# Patient Record
Sex: Male | Born: 1940 | Race: White | Hispanic: No | Smoking: Former smoker
Health system: Southern US, Community
[De-identification: ages and names within clinical notes are randomized; demographics above are authoritative.]

## PROBLEM LIST (undated history)

## (undated) DIAGNOSIS — C259 Malignant neoplasm of pancreas, unspecified: Secondary | ICD-10-CM

## (undated) DIAGNOSIS — K5790 Diverticulosis of intestine, part unspecified, without perforation or abscess without bleeding: Secondary | ICD-10-CM

## (undated) DIAGNOSIS — M75101 Unspecified rotator cuff tear or rupture of right shoulder, not specified as traumatic: Secondary | ICD-10-CM

## (undated) DIAGNOSIS — I10 Essential (primary) hypertension: Secondary | ICD-10-CM

## (undated) DIAGNOSIS — I219 Acute myocardial infarction, unspecified: Secondary | ICD-10-CM

## (undated) DIAGNOSIS — F419 Anxiety disorder, unspecified: Secondary | ICD-10-CM

## (undated) DIAGNOSIS — C787 Secondary malignant neoplasm of liver and intrahepatic bile duct: Secondary | ICD-10-CM

## (undated) HISTORY — DX: Essential (primary) hypertension: I10

## (undated) HISTORY — DX: Diverticulosis of intestine, part unspecified, without perforation or abscess without bleeding: K57.90

## (undated) HISTORY — DX: Acute myocardial infarction, unspecified: I21.9

## (undated) HISTORY — DX: Malignant neoplasm of pancreas, unspecified: C25.9

## (undated) HISTORY — DX: Secondary malignant neoplasm of liver and intrahepatic bile duct: C78.7

## (undated) HISTORY — DX: Unspecified rotator cuff tear or rupture of right shoulder, not specified as traumatic: M75.101

## (undated) HISTORY — PX: CARDIAC CATHETERIZATION: SHX172

## (undated) HISTORY — PX: CORONARY STENT PLACEMENT: SHX1402

---

## 2004-04-30 ENCOUNTER — Ambulatory Visit: Payer: Self-pay | Admitting: Internal Medicine

## 2004-05-13 ENCOUNTER — Ambulatory Visit: Payer: Self-pay | Admitting: Internal Medicine

## 2004-06-11 ENCOUNTER — Ambulatory Visit: Payer: Self-pay | Admitting: Gastroenterology

## 2004-06-20 ENCOUNTER — Ambulatory Visit: Payer: Self-pay | Admitting: Gastroenterology

## 2004-10-31 ENCOUNTER — Ambulatory Visit: Payer: Self-pay | Admitting: Internal Medicine

## 2004-11-07 ENCOUNTER — Ambulatory Visit: Payer: Self-pay | Admitting: Internal Medicine

## 2005-06-09 ENCOUNTER — Ambulatory Visit: Payer: Self-pay | Admitting: Internal Medicine

## 2005-06-16 ENCOUNTER — Ambulatory Visit: Payer: Self-pay | Admitting: Internal Medicine

## 2009-04-16 ENCOUNTER — Ambulatory Visit: Payer: Self-pay | Admitting: Internal Medicine

## 2009-04-16 LAB — CONVERTED CEMR LAB
AST: 21 units/L (ref 0–37)
Alkaline Phosphatase: 67 units/L (ref 39–117)
Basophils Absolute: 0 10*3/uL (ref 0.0–0.1)
Bilirubin, Direct: 0.1 mg/dL (ref 0.0–0.3)
Blood in Urine, dipstick: NEGATIVE
CO2: 30 meq/L (ref 19–32)
Calcium: 9.4 mg/dL (ref 8.4–10.5)
Creatinine, Ser: 1 mg/dL (ref 0.4–1.5)
Eosinophils Absolute: 0.1 10*3/uL (ref 0.0–0.7)
GFR calc non Af Amer: 78.65 mL/min (ref >60)
Glucose, Bld: 102 mg/dL — ABNORMAL HIGH (ref 70–99)
Glucose, Urine, Semiquant: NEGATIVE
HDL: 56.4 mg/dL (ref >39.00)
Hemoglobin: 15.1 g/dL (ref 13.0–17.0)
Ketones, urine, test strip: NEGATIVE
Lymphocytes Relative: 23.5 % (ref 12.0–46.0)
MCHC: 33.6 g/dL (ref 30.0–36.0)
Monocytes Relative: 9.9 % (ref 3.0–12.0)
Neutro Abs: 3.2 10*3/uL (ref 1.4–7.7)
Neutrophils Relative %: 64.2 % (ref 43.0–77.0)
Nitrite: NEGATIVE
Platelets: 175 10*3/uL (ref 150.0–400.0)
Protein, U semiquant: NEGATIVE
RDW: 12.9 % (ref 11.5–14.6)
Sodium: 142 meq/L (ref 135–145)
Total Bilirubin: 1 mg/dL (ref 0.3–1.2)
Triglycerides: 138 mg/dL (ref 0.0–149.0)
VLDL: 27.6 mg/dL (ref 0.0–40.0)
WBC Urine, dipstick: NEGATIVE
pH: 5.5

## 2009-04-23 ENCOUNTER — Ambulatory Visit: Payer: Self-pay | Admitting: Internal Medicine

## 2009-04-23 DIAGNOSIS — F411 Generalized anxiety disorder: Secondary | ICD-10-CM | POA: Insufficient documentation

## 2009-05-09 ENCOUNTER — Ambulatory Visit: Payer: Self-pay

## 2009-05-09 ENCOUNTER — Encounter: Payer: Self-pay | Admitting: Internal Medicine

## 2009-05-17 ENCOUNTER — Encounter (INDEPENDENT_AMBULATORY_CARE_PROVIDER_SITE_OTHER): Payer: Self-pay | Admitting: *Deleted

## 2009-06-18 ENCOUNTER — Ambulatory Visit: Payer: Self-pay | Admitting: Internal Medicine

## 2009-06-18 DIAGNOSIS — I77811 Abdominal aortic ectasia: Secondary | ICD-10-CM

## 2009-08-21 ENCOUNTER — Ambulatory Visit: Payer: Self-pay | Admitting: Internal Medicine

## 2009-12-12 ENCOUNTER — Telehealth: Payer: Self-pay | Admitting: Internal Medicine

## 2009-12-18 ENCOUNTER — Ambulatory Visit: Payer: Self-pay | Admitting: Internal Medicine

## 2009-12-18 DIAGNOSIS — R030 Elevated blood-pressure reading, without diagnosis of hypertension: Secondary | ICD-10-CM

## 2010-04-02 NOTE — Progress Notes (Signed)
Summary: Pt has questions re: Lexapro  Phone Note Call from Patient Call back at [facility] Phone [phone]   Caller: Patient Reason for Call: Acute Illness Summary of Call: Pt called and has questions re: Lexapro. Pls call asap.  Initial call taken by: Lucy Antigua,  December 12, 2009 11:37 AM  Follow-up for Phone Call        stopped lexapro -didnt help encouraged to keep appointment on 18 to discuss with possible other med Follow-up by: Willy Eddy, LPN,  December 12, 2009 12:15 PM

## 2010-04-02 NOTE — Assessment & Plan Note (Signed)
Summary: 2 MONTH ROV/NJR   Vital Signs:  Patient profile:   70 year old male Height:      70 inches Weight:      182 pounds BMI:     26.21 Temp:     98.2 degrees F oral Pulse rate:   68 / minute Resp:     14 per minute BP sitting:   122 / 82  (left arm)  Vitals Entered By: Willy Eddy, LPN (June 18, 2009 4:10 PM) CC: roa- hasnt started lexapro    CC:  roa- hasnt started lexapro .  History of Present Illness: the pt did not start the lexapro he has taked with his daughter and he uses the xanax  as needed his use of the xanax once a week or less worried  about life has completed the visits the AA screen was negative and this has reassured him  Preventive Screening-Counseling & Management  Alcohol-Tobacco     Alcohol drinks/day: <1     Smoking Status: quit     Year Quit: 2005  Problems Prior to Update: 1)  Anxiety State, Unspecified  (ICD-300.00) 2)  Health Screening  (ICD-V70.0)  Current Problems (verified): 1)  Anxiety State, Unspecified  (ICD-300.00) 2)  Health Screening  (ICD-V70.0)  Medications Prior to Update: 1)  Aspirin 81 Mg  Tbec (Aspirin) .... Once Daily 2)  Alprazolam 0.5 Mg Tabs (Alprazolam) .... One By Mouth As Needed Anxiety Every 8 Hours As Needed 3)  Lexapro 5 Mg Tabs (Escitalopram Oxalate) .... One By Mouth Daily ( 1/2 By Mouth Daily)  Current Medications (verified): 1)  Aspirin 81 Mg  Tbec (Aspirin) .... Once Daily 2)  Alprazolam 0.5 Mg Tabs (Alprazolam) .... One By Mouth As Needed Anxiety Every 8 Hours As Needed 3)  Lexapro 5 Mg Tabs (Escitalopram Oxalate) .... One By Mouth Daily ( 1/2 By Mouth Daily)  Allergies (verified): No Known Drug Allergies  Past History:  Risk Factors: Alcohol Use: <1 (06/18/2009) Exercise: yes (04/23/2009)  Risk Factors: Smoking Status: quit (06/18/2009)  Past medical, surgical, family and social histories (including risk factors) reviewed, and no changes noted (except as noted below).  Past Medical  History: Reviewed history from 11/25/2006 and no changes required. Unremarkable  Family History: Reviewed history and no changes required.  Social History: Reviewed history and no changes required.  Review of Systems  The patient denies anorexia, fever, weight loss, weight gain, vision loss, decreased hearing, hoarseness, chest pain, syncope, dyspnea on exertion, peripheral edema, prolonged cough, headaches, hemoptysis, abdominal pain, melena, hematochezia, severe indigestion/heartburn, hematuria, incontinence, genital sores, muscle weakness, suspicious skin lesions, transient blindness, difficulty walking, depression, unusual weight change, abnormal bleeding, enlarged lymph nodes, angioedema, breast masses, and testicular masses.    Physical Exam  General:  Well-developed,well-nourished,in no acute distress; alert,appropriate and cooperative throughout examination Head:  normocephalic, no abnormalities palpated, and male-pattern balding.   Eyes:  pupils equal and pupils round.   Ears:  R ear normal and L ear normal.   Neck:  supple and full ROM.   Lungs:  normal respiratory effort, no dullness, and no wheezes.   Heart:  normal rate and regular rhythm.   Abdomen:  soft, non-tender, and normal bowel sounds.   Neurologic:  alert & oriented X3 and gait normal.   Psych:  Oriented X3 and slightly anxious.     Impression & Recommendations:  Problem # 1:  ANXIETY STATE, UNSPECIFIED (ICD-300.00) disucssion of med use and need for control of mood His updated  medication list for this problem includes:    Alprazolam 0.5 Mg Tabs (Alprazolam) ..... One by mouth as needed anxiety every 8 hours as needed    Lexapro 5 Mg Tabs (Escitalopram oxalate) ..... One by mouth daily ( 1/2 by mouth daily)  Discussed medication use and relaxation techniques.   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 Once daily 2)  Alprazolam 0.5 Mg Tabs (Alprazolam) .... One by mouth as needed anxiety every 8 hours as needed 3)  Lexapro 5 Mg Tabs (Escitalopram oxalate) .... One by mouth daily ( 1/2 by mouth daily)  Patient Instructions: 1)  try the 1/2 of the lexapro 2)  Please schedule a follow-up appointment in 2 months.

## 2010-04-02 NOTE — Assessment & Plan Note (Signed)
Summary: 2 month rov/njr   Vital Signs:  Patient profile:   70 year old male Height:      70 inches Weight:      180 pounds BMI:     25.92 Temp:     98.2 degrees F oral Pulse rate:   72 / minute Resp:     14 per minute BP sitting:   114 / 76  (left arm)  Vitals Entered By: Willy Eddy, LPN (August 21, 2009 3:24 PM) CC: roa after starting lexapro- states he doesnt feel any different   CC:  roa after starting lexapro- states he doesnt feel any different.  History of Present Illness: the pt finally started the lexapro 1/2 a day the pt has not detected any changes still gets emotiolly upset increased anxiety has not been  "fixed" the xanax works better taking minimal anxiety meds  could this be related to the lexapro    Preventive Screening-Counseling & Management  Alcohol-Tobacco     Alcohol drinks/day: <1     Smoking Status: quit     Year Quit: 2005  Problems Prior to Update: 1)  Abdominal Aortic Ectasia  (ICD-447.72) 2)  Anxiety State, Unspecified  (ICD-300.00) 3)  Health Screening  (ICD-V70.0)  Current Problems (verified): 1)  Abdominal Aortic Ectasia  (ICD-447.72) 2)  Anxiety State, Unspecified  (ICD-300.00) 3)  Health Screening  (ICD-V70.0)  Medications Prior to Update: 1)  Aspirin 81 Mg  Tbec (Aspirin) .... Once Daily 2)  Alprazolam 0.5 Mg Tabs (Alprazolam) .... One By Mouth As Needed Anxiety Every 8 Hours As Needed 3)  Lexapro 5 Mg Tabs (Escitalopram Oxalate) .... One By Mouth Daily ( 1/2 By Mouth Daily)  Current Medications (verified): 1)  Aspirin 81 Mg  Tbec (Aspirin) .... Once Daily 2)  Alprazolam 0.5 Mg Tabs (Alprazolam) .... One By Mouth As Needed Anxiety Every 8 Hours As Needed 3)  Lexapro 5 Mg Tabs (Escitalopram Oxalate) .... One By Mouth Daily ( 1/2 By Mouth Daily)  Allergies (verified): No Known Drug Allergies  Past History:  Risk Factors: Alcohol Use: <1 (08/21/2009) Exercise: yes (04/23/2009)  Risk Factors: Smoking Status: quit  (08/21/2009)  Past medical, surgical, family and social histories (including risk factors) reviewed, and no changes noted (except as noted below).  Past Medical History: Reviewed history from 11/25/2006 and no changes required. Unremarkable  Family History: Reviewed history and no changes required.  Social History: Reviewed history and no changes required.  Review of Systems  The patient denies anorexia, fever, weight loss, weight gain, vision loss, decreased hearing, hoarseness, chest pain, syncope, dyspnea on exertion, peripheral edema, prolonged cough, headaches, hemoptysis, abdominal pain, melena, hematochezia, severe indigestion/heartburn, hematuria, incontinence, genital sores, muscle weakness, suspicious skin lesions, transient blindness, difficulty walking, depression, unusual weight change, abnormal bleeding, enlarged lymph nodes, angioedema, and breast masses.    Physical Exam  General:  Well-developed,well-nourished,in no acute distress; alert,appropriate and cooperative throughout examination Head:  normocephalic, no abnormalities palpated, and male-pattern balding.   Eyes:  pupils equal and pupils round.   Ears:  R ear normal and L ear normal.   Nose:  no external deformity.   Mouth:  no gingival abnormalities and pharynx pink and moist.   Neck:  supple and full ROM.   Lungs:  normal respiratory effort, no dullness, and no wheezes.   Heart:  normal rate and regular rhythm.   Abdomen:  soft, non-tender, and normal bowel sounds.   Genitalia:  circumcised.     Impression &  Recommendations:  Problem # 1:  ANXIETY STATE, UNSPECIFIED (ICD-300.00) Assessment Improved  His updated medication list for this problem includes:    Alprazolam 0.5 Mg Tabs (Alprazolam) ..... One by mouth as needed anxiety every 8 hours as needed    Lexapro 5 Mg Tabs (Escitalopram oxalate) ..... One by mouth daily ( 1/2 by mouth daily)  Discussed medication use and relaxation techniques.    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 Once daily 2)  Alprazolam 0.5 Mg Tabs (Alprazolam) .... One by mouth as needed anxiety every 8 hours as needed 3)  Lexapro 5 Mg Tabs (Escitalopram oxalate) .... One by mouth daily ( 1/2 by mouth daily)  Patient Instructions: 1)  Please schedule a follow-up appointment in 4 months. Prescriptions: LEXAPRO 5 MG TABS (ESCITALOPRAM OXALATE) one by mouth daily ( 1/2 by mouth daily)  #30 x 2   Entered and Authorized by:   Stacie Glaze MD   Signed by:   Stacie Glaze MD on 08/21/2009   Method used:   Electronically to        [id number] (retail)       [address]       [place], [place]  [address]       Ph: [phone] or [phone]       Fax: [phone]   RxID:   [id number]

## 2010-04-02 NOTE — Assessment & Plan Note (Signed)
Summary: 4 month rov/njr   Vital Signs:  Patient profile:   70 year old male Height:      70 inches Weight:      182 pounds BMI:     26.21 Temp:     98.2 degrees F oral Pulse rate:   72 / minute Resp:     14 per minute BP sitting:   144 / 80  (left arm)  Vitals Entered By: Willy Eddy, LPN (December 18, 2009 3:48 PM) CC: roa -stopped lexapro and feels fine since stopping, Hypertension Management Is Patient Diabetic? No   Primary Care Asa Baudoin:  Stacie Glaze MD  CC:  roa -stopped lexapro and feels fine since stopping and Hypertension Management.  History of Present Illness: follow up after stopping the lexapro after three months he felt better and less anxious the factors were both the cost and his ability to control his anxiety   Hypertension History:      He denies headache, chest pain, palpitations, dyspnea with exertion, orthopnea, PND, peripheral edema, visual symptoms, neurologic problems, syncope, and side effects from treatment.        Positive major cardiovascular risk factors include male age 27 years old or older.  Negative major cardiovascular risk factors include non-tobacco-user status.     Preventive Screening-Counseling & Management  Alcohol-Tobacco     Alcohol drinks/day: <1     Smoking Status: quit     Year Quit: 2005     Tobacco Counseling: to remain off tobacco products  Problems Prior to Update: 1)  Elevated Bp Reading Without Dx Hypertension  (ICD-796.2) 2)  Abdominal Aortic Ectasia  (ICD-447.72) 3)  Anxiety State, Unspecified  (ICD-300.00) 4)  Health Screening  (ICD-V70.0)  Current Problems (verified): 1)  Abdominal Aortic Ectasia  (ICD-447.72) 2)  Anxiety State, Unspecified  (ICD-300.00) 3)  Health Screening  (ICD-V70.0)  Medications Prior to Update: 1)  Aspirin 81 Mg  Tbec (Aspirin) .... Once Daily 2)  Alprazolam 0.5 Mg Tabs (Alprazolam) .... One By Mouth As Needed Anxiety Every 8 Hours As Needed 3)  Lexapro 5 Mg Tabs  (Escitalopram Oxalate) .... One By Mouth Daily ( 1/2 By Mouth Daily)  Current Medications (verified): 1)  Aspirin 81 Mg  Tbec (Aspirin) .... Once Daily 2)  Alprazolam 0.5 Mg Tabs (Alprazolam) .... One By Mouth As Needed Anxiety Every 8 Hours As Needed  Allergies (verified): No Known Drug Allergies  Past History:  Risk Factors: Alcohol Use: <1 (12/18/2009) Exercise: yes (04/23/2009)  Risk Factors: Smoking Status: quit (12/18/2009)  Past medical, surgical, family and social histories (including risk factors) reviewed, and no changes noted (except as noted below).  Past Medical History: Reviewed history from 11/25/2006 and no changes required. Unremarkable  Family History: Reviewed history and no changes required.  Social History: Reviewed history and no changes required.  Review of Systems  The patient denies anorexia, fever, weight loss, weight gain, vision loss, decreased hearing, hoarseness, chest pain, syncope, dyspnea on exertion, peripheral edema, prolonged cough, headaches, hemoptysis, abdominal pain, melena, hematochezia, severe indigestion/heartburn, hematuria, incontinence, genital sores, muscle weakness, suspicious skin lesions, transient blindness, difficulty walking, depression, unusual weight change, abnormal bleeding, enlarged lymph nodes, angioedema, breast masses, and testicular masses.    Physical Exam  General:  Well-developed,well-nourished,in no acute distress; alert,appropriate and cooperative throughout examination Head:  normocephalic, no abnormalities palpated, and male-pattern balding.   Nose:  no external deformity.   Mouth:  no gingival abnormalities and pharynx pink and moist.   Neck:  supple and full ROM.   Lungs:  normal respiratory effort, no dullness, and no wheezes.   Heart:  normal rate and regular rhythm.   Abdomen:  soft, non-tender, and normal bowel sounds.   Msk:  No deformity or scoliosis noted of thoracic or lumbar spine.     Extremities:  trace left pedal edema and trace right pedal edema.   Neurologic:  alert & oriented X3 and gait normal.     Impression & Recommendations:  Problem # 1:  ANXIETY STATE, UNSPECIFIED (ICD-300.00)  The following medications were removed from the medication list:    Lexapro 5 Mg Tabs (Escitalopram oxalate) ..... One by mouth daily ( 1/2 by mouth daily) His updated medication list for this problem includes:    Alprazolam 0.5 Mg Tabs (Alprazolam) ..... One by mouth as needed anxiety every 8 hours as needed  Discussed medication use and relaxation techniques.   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    Complete Medication List: 1)  Aspirin 81 Mg Tbec (Aspirin) .... Once daily 2)  Alprazolam 0.5 Mg Tabs (Alprazolam) .... One by mouth as needed anxiety every 8 hours as needed  Hypertension Assessment/Plan:      The patient's hypertensive risk group is category B: At least one risk factor (excluding diabetes) with no target organ damage.  His calculated 10 year risk of coronary heart disease is 22 %.  Today's blood pressure is 144/80.  His blood pressure goal is < 140/90.  Patient Instructions: 1)  MArch CPX   Orders Added: 1)  Est. Patient Level III [21308]

## 2010-04-02 NOTE — Miscellaneous (Signed)
Summary: Orders Update  Clinical Lists Changes  Orders: Added new Test order of Abdominal Aorta Duplex (Abd Aorta Duplex) - Signed 

## 2010-04-02 NOTE — Letter (Signed)
Summary: Colonoscopy Date Change Letter  [facility]  [address] [place], [place] [address]   Phone: [phone]  Fax: [phone]      May 17, 2009 MRN: [id number]   Randall LAUDER [facility] [name], [place]  [address]   Dear Randall [name],   Previously you were recommended to have a repeat colonoscopy around this time. Your chart was recently reviewed by Dr. Arlyce Dice of [facility]. Follow up colonoscopy is now recommended in 06-2014.  This revised recommendation is based on current, nationally recognized guidelines for colorectal cancer screening and polyp surveillance. These guidelines are endorsed by the American Cancer Society, [employer] on Colorectal Cancer as well as numerous other major medical organizations.  Please understand that our recommendation assumes that you do not have any new symptoms such as bleeding, a change in bowel habits, anemia, or significant abdominal discomfort. If you do have any concerning GI symptoms or want to discuss the guideline recommendations, please call to arrange an office visit at your earliest convenience. Otherwise we will keep you in our reminder system and contact you 1-2 months prior to the date listed above to schedule your next colonoscopy.  Thank you,  Barbette Hair. Arlyce Dice, M.D.  [facility] [phone]

## 2010-04-02 NOTE — Assessment & Plan Note (Signed)
Summary: CPX // RS   Vital Signs:  Patient profile:   70 year old male Height:      70 inches Weight:      186 pounds BMI:     26.78 Temp:     98.2 degrees F oral Pulse rate:   68 / minute Resp:     14 per minute BP sitting:   140 / 80  (left arm)  Vitals Entered By: Willy Eddy, LPN (April 23, 2009 11:19 AM) CC: annual visit for disease management Is Patient Diabetic? No   CC:  annual visit for disease management.  History of Present Illness: The pt was asked about all immunizations, health maint. services that are appropriate to their age and was given guidance on diet exercize  and weight management   Preventive Screening-Counseling & Management  Alcohol-Tobacco     Alcohol drinks/day: <1     Smoking Status: quit     Year Quit: 2005  Caffeine-Diet-Exercise     Does Patient Exercise: yes     Times/week: 4     Exercise Counseling: not indicated; exercise is adequate  Problems Prior to Update: 1)  Anxiety State, Unspecified  (ICD-300.00) 2)  Health Screening  (ICD-V70.0)  Current Problems (verified): None  Medications Prior to Update: 1)  Aspirin 81 Mg  Tbec (Aspirin) .... Once Daily 2)  Clonazepam 0.5 Mg  Tabs (Clonazepam) .... As Needed  Current Medications (verified): 1)  Aspirin 81 Mg  Tbec (Aspirin) .... Once Daily 2)  Alprazolam 0.5 Mg Tabs (Alprazolam) .... One By Mouth As Needed Anxiety Every 8 Hours As Needed 3)  Lexapro 5 Mg Tabs (Escitalopram Oxalate) .... One By Mouth Daily ( 1/2 By Mouth Daily)  Allergies (verified): No Known Drug Allergies  Contraindications/Deferment of Procedures/Staging:    Test/Procedure: FLU VAX    Reason for deferment: patient declined   Past History:  Risk Factors: Alcohol Use: <1 (04/23/2009) Exercise: yes (04/23/2009)  Risk Factors: Smoking Status: quit (04/23/2009)  Past medical, surgical, family and social histories (including risk factors) reviewed, and no changes noted (except as noted  below).  Past Medical History: Reviewed history from 11/25/2006 and no changes required. Unremarkable  Family History: Reviewed history and no changes required.  Social History: Reviewed history and no changes required. Smoking Status:  quit Does Patient Exercise:  yes  Review of Systems  The patient denies anorexia, fever, weight loss, weight gain, vision loss, decreased hearing, hoarseness, chest pain, syncope, dyspnea on exertion, peripheral edema, prolonged cough, headaches, hemoptysis, abdominal pain, melena, hematochezia, severe indigestion/heartburn, hematuria, incontinence, genital sores, muscle weakness, suspicious skin lesions, transient blindness, difficulty walking, depression, unusual weight change, abnormal bleeding, enlarged lymph nodes, angioedema, and breast masses.    Physical Exam  General:  Well-developed,well-nourished,in no acute distress; alert,appropriate and cooperative throughout examination Head:  normocephalic, no abnormalities palpated, and male-pattern balding.   Eyes:  pupils equal and pupils round.   Ears:  R ear normal and L ear normal.   Nose:  no external deformity.   Mouth:  no gingival abnormalities and pharynx pink and moist.   Neck:  supple and full ROM.   Chest Wall:  no masses and barrel-chest deformity.   Breasts:  no gynecomastia and no adenopathy.   Lungs:  normal respiratory effort, no dullness, and no wheezes.   Heart:  normal rate and regular rhythm.   Genitalia:  circumcised.   Msk:  No deformity or scoliosis noted of thoracic or lumbar spine.   Extremities:  trace left pedal edema and trace right pedal edema.   Neurologic:  alert & oriented X3 and cranial nerves II-XII intact.   Skin:  turgor normal and color normal.   Cervical Nodes:  No lymphadenopathy noted Axillary Nodes:  No palpable lymphadenopathy Inguinal Nodes:  No significant adenopathy Psych:  Cognition and judgment appear intact. Alert and cooperative with normal  attention span and concentration. No apparent delusions, illusions, hallucinations   Impression & Recommendations:  Problem # 1:  Preventive Health Care (ICD-V70.0) Assessment Unchanged The pt was asked about all immunizations, health maint. services that are appropriate to their age and was given guidance on diet exercize  and weight management  Colonoscopy: historical (03/03/2004) Td Booster: historical (03/04/2003)   Pneumovax: historical (03/03/2005) Chol: 185 (04/16/2009)   HDL: 56.40 (04/16/2009)   LDL: 101 (04/16/2009)   TG: 138.0 (04/16/2009) TSH: 1.19 (04/16/2009)   PSA: 0.81 (04/16/2009)  Discussed using sunscreen, use of alcohol, drug use, self testicular exam, routine dental care, routine eye care, routine physical exam, seat belts, multiple vitamins, osteoporosis prevention, adequate calcium intake in diet, and recommendations for immunizations.  Discussed exercise and checking cholesterol.  Discussed gun safety, safe sex, and contraception. Also recommend checking PSA.  Problem # 2:  ANXIETY STATE, UNSPECIFIED (ICD-300.00)  The following medications were removed from the medication list:    Clonazepam 0.5 Mg Tabs (Clonazepam) .Marland Kitchen... As needed His updated medication list for this problem includes:    Alprazolam 0.5 Mg Tabs (Alprazolam) ..... One by mouth as needed anxiety every 8 hours as needed    Lexapro 5 Mg Tabs (Escitalopram oxalate) ..... One by mouth daily ( 1/2 by mouth daily)  Discussed medication use and relaxation techniques.   Complete Medication List: 1)  Aspirin 81 Mg Tbec (Aspirin) .... Once daily 2)  Alprazolam 0.5 Mg Tabs (Alprazolam) .... One by mouth as needed anxiety every 8 hours as needed 3)  Lexapro 5 Mg Tabs (Escitalopram oxalate) .... One by mouth daily ( 1/2 by mouth daily)  Other Orders: EKG w/ Interpretation (93000) Doppler Referral (Doppler)  Patient Instructions: 1)  Please schedule a follow-up appointment in 2 month. 2)  you will be  called with the appointment for the dopler of your aorta Prescriptions: ALPRAZOLAM 0.5 MG TABS (ALPRAZOLAM) one by mouth as needed anxiety every 8 hours as needed  #60 x 1   Entered and Authorized by:   Stacie Glaze MD   Signed by:   Stacie Glaze MD on 04/23/2009   Method used:   Print then Give to Patient   RxID:   [id number]

## 2010-05-13 ENCOUNTER — Other Ambulatory Visit (INDEPENDENT_AMBULATORY_CARE_PROVIDER_SITE_OTHER): Payer: Medicare Other | Admitting: Internal Medicine

## 2010-05-13 DIAGNOSIS — Z0000 Encounter for general adult medical examination without abnormal findings: Secondary | ICD-10-CM

## 2010-05-13 DIAGNOSIS — Z125 Encounter for screening for malignant neoplasm of prostate: Secondary | ICD-10-CM

## 2010-05-13 DIAGNOSIS — Z79899 Other long term (current) drug therapy: Secondary | ICD-10-CM

## 2010-05-13 DIAGNOSIS — F411 Generalized anxiety disorder: Secondary | ICD-10-CM

## 2010-05-13 LAB — BASIC METABOLIC PANEL WITH GFR
BUN: 22 mg/dL (ref 6–23)
CO2: 28 meq/L (ref 19–32)
Calcium: 9.1 mg/dL (ref 8.4–10.5)
Chloride: 101 meq/L (ref 96–112)
Creatinine, Ser: 1 mg/dL (ref 0.4–1.5)
GFR: 82.19 mL/min (ref >60.00)
Glucose, Bld: 114 mg/dL — ABNORMAL HIGH (ref 70–99)
Potassium: 5.1 meq/L (ref 3.5–5.1)
Sodium: 136 meq/L (ref 135–145)

## 2010-05-13 LAB — HEPATIC FUNCTION PANEL
ALT: 25 U/L (ref 0–53)
AST: 22 U/L (ref 0–37)
Albumin: 4.4 g/dL (ref 3.5–5.2)
Alkaline Phosphatase: 61 U/L (ref 39–117)
Bilirubin, Direct: 0.2 mg/dL (ref 0.0–0.3)
Total Bilirubin: 0.7 mg/dL (ref 0.3–1.2)
Total Protein: 6.8 g/dL (ref 6.0–8.3)

## 2010-05-13 LAB — CBC WITH DIFFERENTIAL/PLATELET
Basophils Absolute: 0 10*3/uL (ref 0.0–0.1)
Eosinophils Relative: 2.7 % (ref 0.0–5.0)
HCT: 43 % (ref 39.0–52.0)
Hemoglobin: 14.9 g/dL (ref 13.0–17.0)
Lymphs Abs: 1.2 10*3/uL (ref 0.7–4.0)
MCV: 92.9 fl (ref 78.0–100.0)
Monocytes Absolute: 0.4 10*3/uL (ref 0.1–1.0)
Monocytes Relative: 7.1 % (ref 3.0–12.0)
Neutro Abs: 4.2 10*3/uL (ref 1.4–7.7)
Platelets: 195 10*3/uL (ref 150.0–400.0)
RDW: 13.6 % (ref 11.5–14.6)

## 2010-05-13 LAB — POCT URINALYSIS DIPSTICK
Bilirubin, UA: NEGATIVE
Blood, UA: NEGATIVE
Glucose, UA: NEGATIVE
Ketones, UA: NEGATIVE
Leukocytes, UA: NEGATIVE
Nitrite, UA: NEGATIVE
Protein, UA: NEGATIVE
Spec Grav, UA: 1.02
Urobilinogen, UA: 0.2
pH, UA: 7

## 2010-05-13 LAB — LIPID PANEL
Cholesterol: 189 mg/dL (ref 0–200)
HDL: 53 mg/dL (ref >39.00)
LDL Cholesterol: 111 mg/dL — ABNORMAL HIGH (ref 0–99)
Total CHOL/HDL Ratio: 4
Triglycerides: 127 mg/dL (ref 0.0–149.0)
VLDL: 25.4 mg/dL (ref 0.0–40.0)

## 2010-05-13 LAB — TSH: TSH: 0.96 u[IU]/mL (ref 0.35–5.50)

## 2010-05-13 LAB — PSA: PSA: 1.06 ng/mL (ref 0.10–4.00)

## 2010-05-22 ENCOUNTER — Encounter: Payer: Self-pay | Admitting: Internal Medicine

## 2010-05-22 ENCOUNTER — Ambulatory Visit (INDEPENDENT_AMBULATORY_CARE_PROVIDER_SITE_OTHER): Payer: Medicare Other | Admitting: Internal Medicine

## 2010-05-22 VITALS — BP 136/84 | HR 76 | Temp 98.1°F | Resp 14 | Ht 68.0 in | Wt 184.0 lb

## 2010-05-22 DIAGNOSIS — F411 Generalized anxiety disorder: Secondary | ICD-10-CM

## 2010-05-22 DIAGNOSIS — R030 Elevated blood-pressure reading, without diagnosis of hypertension: Secondary | ICD-10-CM

## 2010-05-22 DIAGNOSIS — Z0000 Encounter for general adult medical examination without abnormal findings: Secondary | ICD-10-CM

## 2010-05-22 MED ORDER — ALPRAZOLAM 0.5 MG PO TABS: 0.5000 mg | ORAL_TABLET | Freq: Three times a day (TID) | ORAL | Status: DC | PRN

## 2010-05-22 NOTE — Assessment & Plan Note (Signed)
He is kept a careful record of his blood pressures with the minimum weekly readings for the past 3 months his blood pressures have been averaging in the 130/80 range with no recorded blood pressures over 150/90 I suspect he has a component of anxiety when he comes to the office and that his blood pressure is well controlled on the outpatient basis he also works out at a gym where they can check his blood pressure and he does his blood pressure checked periodically there before workouts and has been in normal range

## 2010-05-22 NOTE — Progress Notes (Signed)
  Subjective:    Patient ID: Randall [name], male    DOB: 02/11/1941, 70 y.o.   MRN: [id number]  HPI  Presents for they will S. Examination he is without complaints today he designated that he has had a little dietary indiscretion over the past few months has gained about 5 pounds has been eating more sugars and more carbohydrates and this is reflected in his fasting blood sugar as well as and his total cholesterol and LDL cholesterol.  All in all he feels well he exercises 3 times a week and does cardio 3 times a week on the days that he does not go to the gym.  We monitor him for a moderate elevations in his blood pressure and he is kept a record of his blood pressures since he was last seen he states that they average in the mid 130s over the mid 80s and he is not detected any elevations over 150/90.  He denies any chest pain shortness of breath PND orthopnea  Review of Systems  Constitutional: Negative for fever and fatigue.  HENT: Negative for hearing loss, congestion, neck pain and postnasal drip.   Eyes: Negative for discharge, redness and visual disturbance.  Respiratory: Negative for cough, shortness of breath and wheezing.   Cardiovascular: Negative for leg swelling.  Gastrointestinal: Negative for abdominal pain, constipation and abdominal distention.  Genitourinary: Negative for urgency and frequency.  Musculoskeletal: Negative for joint swelling and arthralgias.  Skin: Negative for color change and rash.  Neurological: Negative for weakness and light-headedness.  Hematological: Negative for adenopathy.  Psychiatric/Behavioral: Negative for behavioral problems.  History reviewed. No pertinent past medical history. History reviewed. No pertinent past surgical history.  reports that he quit smoking about 7 years ago. He does not have any smokeless tobacco history on file. He reports that he drinks alcohol. He reports that he does not use illicit drugs. family history includes  Cancer in his mother and Heart disease in his father. Not on File      Objective:   Physical Exam  Constitutional: He is oriented to person, place, and time. He appears well-developed and well-nourished.  HENT:  Head: Normocephalic and atraumatic.  Eyes: Conjunctivae are normal. Pupils are equal, round, and reactive to light.  Neck: Normal range of motion. Neck supple.  Cardiovascular: Normal rate and regular rhythm.   Pulmonary/Chest: Effort normal and breath sounds normal.  Abdominal: Soft. Bowel sounds are normal.  Genitourinary: Rectum normal and prostate normal. Guaiac negative stool. No penile tenderness.  Musculoskeletal: Normal range of motion.  Neurological: He is alert and oriented to person, place, and time.  Skin: Skin is warm and dry.  Psychiatric: His behavior is normal. Judgment and thought content normal.          Assessment & Plan:   This is a routine physical examination for this healthy  Male. Reviewed all health maintenance protocols including mammography colonoscopy bone density and reviewed appropriate screening labs. Her immunization history was reviewed as well as her current medications and allergies refills of her chronic medications were given and the plan for yearly health maintenance was discussed all orders and referrals were made as appropriate.

## 2010-05-22 NOTE — Assessment & Plan Note (Signed)
His anxiety is controlled by an occasional Xanax he has been doing well and has actually made one prescription lasts for approximately six-month

## 2011-02-06 ENCOUNTER — Telehealth: Payer: Self-pay | Admitting: Internal Medicine

## 2011-02-06 NOTE — Telephone Encounter (Signed)
Refill Alprazolam to [facility] Pt would like for Bonnye to call him when done. Thanks

## 2011-02-07 ENCOUNTER — Other Ambulatory Visit: Payer: Self-pay | Admitting: *Deleted

## 2011-02-07 MED ORDER — ALPRAZOLAM 0.5 MG PO TABS: 0.5000 mg | ORAL_TABLET | Freq: Three times a day (TID) | ORAL | Status: DC | PRN

## 2011-02-07 NOTE — Telephone Encounter (Signed)
Pt informed

## 2011-05-19 ENCOUNTER — Other Ambulatory Visit (INDEPENDENT_AMBULATORY_CARE_PROVIDER_SITE_OTHER): Payer: Medicare Other

## 2011-05-19 DIAGNOSIS — Z136 Encounter for screening for cardiovascular disorders: Secondary | ICD-10-CM

## 2011-05-19 DIAGNOSIS — F411 Generalized anxiety disorder: Secondary | ICD-10-CM

## 2011-05-19 DIAGNOSIS — Z125 Encounter for screening for malignant neoplasm of prostate: Secondary | ICD-10-CM

## 2011-05-19 DIAGNOSIS — R030 Elevated blood-pressure reading, without diagnosis of hypertension: Secondary | ICD-10-CM

## 2011-05-19 DIAGNOSIS — Z0000 Encounter for general adult medical examination without abnormal findings: Secondary | ICD-10-CM

## 2011-05-19 LAB — TSH: TSH: 1.05 u[IU]/mL (ref 0.35–5.50)

## 2011-05-19 LAB — HEPATIC FUNCTION PANEL
AST: 26 U/L (ref 0–37)
Alkaline Phosphatase: 65 U/L (ref 39–117)
Bilirubin, Direct: 0.1 mg/dL (ref 0.0–0.3)
Total Bilirubin: 0.4 mg/dL (ref 0.3–1.2)

## 2011-05-19 LAB — BASIC METABOLIC PANEL
BUN: 22 mg/dL (ref 6–23)
Calcium: 9.2 mg/dL (ref 8.4–10.5)
GFR: 82.95 mL/min (ref >60.00)
Glucose, Bld: 116 mg/dL — ABNORMAL HIGH (ref 70–99)

## 2011-05-19 LAB — CBC WITH DIFFERENTIAL/PLATELET
Basophils Absolute: 0 10*3/uL (ref 0.0–0.1)
Lymphocytes Relative: 22.5 % (ref 12.0–46.0)
Monocytes Relative: 10 % (ref 3.0–12.0)
Neutrophils Relative %: 64.1 % (ref 43.0–77.0)
Platelets: 177 10*3/uL (ref 150.0–400.0)
RDW: 13.7 % (ref 11.5–14.6)
WBC: 4.8 10*3/uL (ref 4.5–10.5)

## 2011-05-19 LAB — POCT URINALYSIS DIPSTICK
Bilirubin, UA: NEGATIVE
Glucose, UA: NEGATIVE
Ketones, UA: NEGATIVE
Spec Grav, UA: 1.02
Urobilinogen, UA: 0.2

## 2011-05-19 LAB — LIPID PANEL
HDL: 53.7 mg/dL (ref >39.00)
VLDL: 14.6 mg/dL (ref 0.0–40.0)

## 2011-05-26 ENCOUNTER — Encounter: Payer: Medicare Other | Admitting: Internal Medicine

## 2011-06-16 ENCOUNTER — Ambulatory Visit (INDEPENDENT_AMBULATORY_CARE_PROVIDER_SITE_OTHER): Payer: Medicare Other | Admitting: Internal Medicine

## 2011-06-16 ENCOUNTER — Encounter: Payer: Self-pay | Admitting: Internal Medicine

## 2011-06-16 VITALS — BP 140/80 | HR 72 | Temp 98.2°F | Resp 16 | Ht 69.5 in | Wt 177.0 lb

## 2011-06-16 DIAGNOSIS — I714 Abdominal aortic aneurysm, without rupture: Secondary | ICD-10-CM

## 2011-06-16 DIAGNOSIS — Z0000 Encounter for general adult medical examination without abnormal findings: Secondary | ICD-10-CM

## 2011-06-16 NOTE — Patient Instructions (Signed)
The patient is instructed to continue all medications as prescribed. Schedule followup with check out clerk upon leaving the clinic  

## 2011-07-14 ENCOUNTER — Encounter (INDEPENDENT_AMBULATORY_CARE_PROVIDER_SITE_OTHER): Payer: Medicare Other

## 2011-07-14 DIAGNOSIS — I714 Abdominal aortic aneurysm, without rupture: Secondary | ICD-10-CM

## 2011-09-11 ENCOUNTER — Ambulatory Visit (INDEPENDENT_AMBULATORY_CARE_PROVIDER_SITE_OTHER): Payer: Medicare Other | Admitting: Family

## 2011-09-11 ENCOUNTER — Encounter: Payer: Self-pay | Admitting: Family

## 2011-09-11 VITALS — BP 170/90 | Temp 98.5°F | Wt 178.0 lb

## 2011-09-11 DIAGNOSIS — L299 Pruritus, unspecified: Secondary | ICD-10-CM

## 2011-09-11 DIAGNOSIS — L259 Unspecified contact dermatitis, unspecified cause: Secondary | ICD-10-CM

## 2011-09-11 MED ORDER — METHYLPREDNISOLONE 4 MG PO KIT: PACK | ORAL | Status: AC

## 2011-09-11 MED ORDER — ALPRAZOLAM 0.5 MG PO TABS: 0.5000 mg | ORAL_TABLET | Freq: Three times a day (TID) | ORAL | Status: DC | PRN

## 2011-09-11 NOTE — Progress Notes (Signed)
  Subjective:    Patient ID: Randall [name], male    DOB: [birth year], 71 y.o.   MRN: [id number]  HPI 71 year old white male, nonsmoker, patient of Dr. Lovell Sheehan is in today with complaints of a rash on his lower abdomen that's been present for 2 days. He denies pain. Has itching to his lower right abdomen. He has a similar rash to his right neck that is also itchy. He denies any changes in detergents, soaps, lotions. No New foods. He is here to just be sure that he does not have shingles.  He is also requesting a refill on his Xanax for anxiety. Tolerating medication well. Denies any concerns.   Review of Systems  Constitutional: Negative.   Respiratory: Negative.   Cardiovascular: Negative.   Skin: Positive for rash.       Red skin rash to the right lower abdomen and right neck. Described as itchy   Neurological: Negative.   Hematological: Negative.   Psychiatric/Behavioral: Negative.    No past medical history on file.  History   Social History  . Marital Status: Divorced    Spouse Name: N/A    Number of Children: N/A  . Years of Education: N/A   Occupational History  . Not on file.   Social History Main Topics  . Smoking status: Former Smoker    Quit date: 03/04/2003  . Smokeless tobacco: Not on file  . Alcohol Use: Yes  . Drug Use: No  . Sexually Active: Yes   Other Topics Concern  . Not on file   Social History Narrative  . No narrative on file    No past surgical history on file.  Family History  Problem Relation Age of Onset  . Cancer Mother     oropharengial  . Heart disease Father     No Known Allergies  Current Outpatient Prescriptions on File Prior to Visit  Medication Sig Dispense Refill  . aspirin 81 MG tablet Take 81 mg by mouth daily.          BP 170/90  Temp 98.5 F (36.9 C) (Oral)  Wt 178 lb (80.74 kg)chart    Objective:   Physical Exam  Constitutional: He is oriented to person, place, and time. He appears well-developed and  well-nourished.  Neck: Normal range of motion. Neck supple.  Cardiovascular: Normal rate, regular rhythm and normal heart sounds.   Pulmonary/Chest: Effort normal and breath sounds normal.  Neurological: He is alert and oriented to person, place, and time.  Skin: Skin is warm and dry. Rash noted.       Red, papular rash noticed in the right lower abdomen. No drainage or discharge, no vesicles. Similar rash noted to the right neck. Mild excoriation noted. No signs and symptoms of infection.  Psychiatric: He has a normal mood and affect.    Blood pressure recheck 170/90      Assessment & Plan:  Assessment: Contact dermatitis, anxiety, elevated blood pressure  Plan: Medrol Dosepak as directed. Refill Xanax. Patient says that his blood pressure typically runs high at the doctor's office. I've advised him to keep a blood pressure diary and he will contact us with his blood pressure readings at home. We have discussed the long-term effects of elevated blood pressure on the body. Patient to followup with Dr. Lovell Sheehan as scheduled and sooner when necessary.

## 2011-09-11 NOTE — Patient Instructions (Signed)

## 2011-09-12 ENCOUNTER — Other Ambulatory Visit: Payer: Self-pay | Admitting: *Deleted

## 2011-11-30 NOTE — Progress Notes (Signed)
Subjective:    Patient ID: Randall [name], male    DOB: Feb 08, 1941, 71 y.o.   MRN: [id number]  HPI  Patient presents for yearly examination.  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  He states that he monitors his blood pressure home his blood pressure at home are always within normal limits he states that he believes he has a white coat syndrome.  He brings with him today a series of normal readings from home blood pressure monitoring.  He takes alprazolam as his only medication other than an aspirin for prophylaxis  Review of Systems  Constitutional: Negative for fever and fatigue.  HENT: Negative for hearing loss, congestion, neck pain and postnasal drip.   Eyes: Negative for discharge, redness and visual disturbance.  Respiratory: Negative for cough, shortness of breath and wheezing.   Cardiovascular: Negative for leg swelling.  Gastrointestinal: Negative for abdominal pain, constipation and abdominal distention.  Genitourinary: Negative for urgency and frequency.  Musculoskeletal: Negative for joint swelling and arthralgias.  Skin: Negative for color change and rash.  Neurological: Negative for weakness and light-headedness.  Hematological: Negative for adenopathy.  Psychiatric/Behavioral: Negative for behavioral problems.   No past medical history on file.  History   Social History  . Marital Status: Divorced    Spouse Name: N/A    Number of Children: N/A  . Years of Education: N/A   Occupational History  . Not on file.   Social History Main Topics  . Smoking status: Former Smoker    Quit date: 03/04/2003  . Smokeless tobacco: Not on file  . Alcohol Use: Yes  . Drug Use: No  . Sexually Active: Yes   Other Topics Concern  . Not on file   Social History Narrative  . No narrative on file    No past surgical  history on file.  Family History  Problem Relation Age of Onset  . Cancer Mother     oropharengial  . Heart disease Father     No Known Allergies  Current Outpatient Prescriptions on File Prior to Visit  Medication Sig Dispense Refill  . aspirin 81 MG tablet Take 81 mg by mouth daily.          BP 140/80  Pulse 72  Temp 98.2 F (36.8 C)  Resp 16  Ht 5' 9.5" (1.765 m)  Wt 177 lb (80.287 kg)  BMI 25.76 kg/m2       Objective:   Physical Exam  Constitutional: He is oriented to person, place, and time. He appears well-developed and well-nourished.  HENT:  Head: Normocephalic and atraumatic.  Eyes: Conjunctivae normal are normal. Pupils are equal, round, and reactive to light.  Neck: Normal range of motion. Neck supple.  Cardiovascular: Normal rate and regular rhythm.   Murmur heard. Pulmonary/Chest: Effort normal and breath sounds normal.  Abdominal: Soft. Bowel sounds are normal. He exhibits distension.  Genitourinary: Rectum normal and prostate normal.  Musculoskeletal: Normal range of motion.  Neurological: He is alert and oriented to person, place, and time.  Skin: Skin is warm and dry.  Psychiatric: His behavior is normal. Thought content normal.          Assessment & Plan:   Patient presents for yearly preventative medicine examination.   all immunizations and health maintenance protocols were reviewed with the patient and they are up to date with  these protocols.   screening laboratory values were reviewed with the patient including screening of hyperlipidemia PSA renal function and hepatic function.   There medications past medical history social history problem list and allergies were reviewed in detail.   Goals were established with regard to weight loss exercise diet in compliance with medications He'll be referred for his abdominal aortic ultrasound

## 2011-12-22 ENCOUNTER — Ambulatory Visit: Payer: Medicare Other | Admitting: Internal Medicine

## 2012-03-08 ENCOUNTER — Ambulatory Visit (INDEPENDENT_AMBULATORY_CARE_PROVIDER_SITE_OTHER): Payer: Medicare Other | Admitting: Internal Medicine

## 2012-03-08 ENCOUNTER — Encounter: Payer: Self-pay | Admitting: Internal Medicine

## 2012-03-08 VITALS — BP 138/88 | HR 76 | Temp 98.6°F | Resp 16 | Ht 69.5 in | Wt 182.0 lb

## 2012-03-08 DIAGNOSIS — R030 Elevated blood-pressure reading, without diagnosis of hypertension: Secondary | ICD-10-CM

## 2012-03-08 DIAGNOSIS — F411 Generalized anxiety disorder: Secondary | ICD-10-CM

## 2012-03-08 DIAGNOSIS — F419 Anxiety disorder, unspecified: Secondary | ICD-10-CM

## 2012-03-08 MED ORDER — ALPRAZOLAM 0.5 MG PO TABS: 0.5000 mg | ORAL_TABLET | Freq: Three times a day (TID) | ORAL | Status: DC | PRN

## 2012-03-08 NOTE — Progress Notes (Signed)
  Subjective:    Patient ID: Randall [name], male    DOB: Apr 21, 1940, 72 y.o.   MRN: [id number]  HPI The patients blood pressure was elevated but returned to normal Has hx of "white coat" but has hx of small AAA Mild anxiety disorder Stable panic attacks    Review of Systems  Constitutional: Negative for fever and fatigue.  HENT: Negative for hearing loss, congestion, neck pain and postnasal drip.   Eyes: Negative for discharge, redness and visual disturbance.  Respiratory: Negative for cough, shortness of breath and wheezing.   Cardiovascular: Negative for leg swelling.  Gastrointestinal: Negative for abdominal pain, constipation and abdominal distention.  Genitourinary: Negative for urgency and frequency.  Musculoskeletal: Negative for joint swelling and arthralgias.  Skin: Negative for color change and rash.  Neurological: Negative for weakness and light-headedness.  Hematological: Negative for adenopathy.  Psychiatric/Behavioral: Negative for behavioral problems.       Objective:   Physical Exam  Nursing note and vitals reviewed. Constitutional: He appears well-developed and well-nourished.  HENT:  Head: Atraumatic.  Eyes: Conjunctivae normal are normal. Pupils are equal, round, and reactive to light.  Neck: Normal range of motion. Neck supple.  Cardiovascular: Normal rate and regular rhythm.   Pulmonary/Chest: Effort normal and breath sounds normal.  Abdominal: Soft. Bowel sounds are normal.          Assessment & Plan:  Stable blood pressure monitoring at home due to hx of AAA AAA monitoring in June Refill xanax

## 2012-03-08 NOTE — Patient Instructions (Signed)
No past medical history on file.  History   Social History  . Marital Status: Divorced    Spouse Name: N/A    Number of Children: N/A  . Years of Education: N/A   Occupational History  . Not on file.   Social History Main Topics  . Smoking status: Former Smoker    Quit date: 03/04/2003  . Smokeless tobacco: Not on file  . Alcohol Use: Yes  . Drug Use: No  . Sexually Active: Yes   Other Topics Concern  . Not on file   Social History Narrative  . No narrative on file     The patient is instructed to continue all medications as prescribed. Schedule followup with check out clerk upon leaving the clinic

## 2012-05-31 ENCOUNTER — Telehealth: Payer: Self-pay | Admitting: Internal Medicine

## 2012-05-31 ENCOUNTER — Encounter: Payer: Self-pay | Admitting: Internal Medicine

## 2012-05-31 NOTE — Telephone Encounter (Signed)
Per Dr. Lovell Sheehan he would like to have the patient seen before July. Preferably an afternoon in April.

## 2012-05-31 NOTE — Telephone Encounter (Signed)
We just dont have anything before July,but tell him we will keep him on the waiting list in case someone cancells

## 2012-06-14 ENCOUNTER — Ambulatory Visit (INDEPENDENT_AMBULATORY_CARE_PROVIDER_SITE_OTHER): Payer: Medicare Other | Admitting: Internal Medicine

## 2012-06-14 ENCOUNTER — Encounter: Payer: Self-pay | Admitting: Internal Medicine

## 2012-06-14 VITALS — BP 140/74 | HR 76 | Temp 98.2°F | Resp 16 | Ht 69.5 in | Wt 176.0 lb

## 2012-06-14 DIAGNOSIS — F411 Generalized anxiety disorder: Secondary | ICD-10-CM

## 2012-06-14 DIAGNOSIS — R141 Gas pain: Secondary | ICD-10-CM

## 2012-06-14 DIAGNOSIS — R030 Elevated blood-pressure reading, without diagnosis of hypertension: Secondary | ICD-10-CM

## 2012-06-14 NOTE — Patient Instructions (Addendum)
Flatulence Burping releases air that you swallow. The bubbles from some drinks may cause burps. There are good germs in your gut to help you digest food. Gas is produced by these germs and released from your bottom. Most people release 3 to 4 quarts of gas every day. This is normal.  The most common gas producing foods are bread ( any bread even whole wheat) HOME CARE  Eat or drink less of the foods or liquids that give you gas.  Take the time to chew your food well. Talk less while you eat.  Do not suck on ice or hard candy.  Sip slowly. Stir some of the bubbles out of fizzy drinks with a spoon or straw.  Avoid chewing gum or smoking.  Ask your doctor about liquids and tablets that may help control burping and gas.  Only take medicine as told by your doctor.  To see if it is the bread stop bread and pasta  for 5 days and see if the gas gets better, could also try limiting the milk intake Sometime we can develop intolerances as we age If that does not help  Try "beano" OTC if that helps its the veggie and have to cut back veggie portions. Try a probiotic this helps to restore digestion and let you eat whatever you wish  Try probiotic first   Samples given GET HELP RIGHT AWAY IF:   There is discomfort when you burp or pass gas.  You throw up (vomit) when you burp.  Poop (stool) comes out when you pass gas.  Your belly is puffy (swollen) and hard. MAKE SURE YOU:   Understand these instructions.  Will watch your condition.  Will get help right away if you are not doing well or get worse. Document Released: 12/21/2007 Document Revised: 05/12/2011 Document Reviewed: 12/21/2007 [facility] Patient Information 2013 [place], [place].

## 2012-06-14 NOTE — Progress Notes (Signed)
  Subjective:    Patient ID: Randall [name], male    DOB: Feb 07, 1941, 72 y.o.   MRN: [id number]  HPI Anxiety stable Traveling to [facility] and caught a cold with extreme cough. Had chest wall pain and abdominal wall pain Severe cough Rib pain Increased gas? vegetables     Review of Systems  Constitutional: Negative for fever and fatigue.  HENT: Negative for hearing loss, congestion, neck pain and postnasal drip.   Eyes: Negative for discharge, redness and visual disturbance.  Respiratory: Negative for cough, shortness of breath and wheezing.   Cardiovascular: Negative for leg swelling.  Gastrointestinal: Negative for abdominal pain, constipation and abdominal distention.  Genitourinary: Negative for urgency and frequency.  Musculoskeletal: Negative for joint swelling and arthralgias.  Skin: Negative for color change and rash.  Neurological: Negative for weakness and light-headedness.  Hematological: Negative for adenopathy.  Psychiatric/Behavioral: Negative for behavioral problems.   History reviewed. No pertinent past medical history.  History   Social History  . Marital Status: Divorced    Spouse Name: N/A    Number of Children: N/A  . Years of Education: N/A   Occupational History  . Not on file.   Social History Main Topics  . Smoking status: Former Smoker    Quit date: 03/04/2003  . Smokeless tobacco: Not on file  . Alcohol Use: Yes  . Drug Use: No  . Sexually Active: Yes   Other Topics Concern  . Not on file   Social History Narrative  . No narrative on file    History reviewed. No pertinent past surgical history.  Family History  Problem Relation Age of Onset  . Cancer Mother     oropharengial  . Heart disease Father     No Known Allergies  Current Outpatient Prescriptions on File Prior to Visit  Medication Sig Dispense Refill  . ALPRAZolam (XANAX) 0.5 MG tablet Take 1 tablet (0.5 mg total) by mouth 3 (three) times daily as needed.  30  tablet  3  . aspirin 81 MG tablet Take 81 mg by mouth daily.         No current facility-administered medications on file prior to visit.    BP 140/74  Pulse 76  Temp(Src) 98.2 F (36.8 C)  Resp 16  Ht 5' 9.5" (1.765 m)  Wt 176 lb (79.833 kg)  BMI 25.63 kg/m2       Objective:   Physical Exam  Constitutional: He appears well-developed and well-nourished.  HENT:  Head: Normocephalic and atraumatic.  Eyes: Conjunctivae are normal. Pupils are equal, round, and reactive to light.  Neck: Normal range of motion. Neck supple.  Cardiovascular: Normal rate and regular rhythm.   Pulmonary/Chest: Effort normal and breath sounds normal. He exhibits tenderness.  Abdominal: Soft. Bowel sounds are normal. He exhibits distension. There is tenderness.          Assessment & Plan:  Post viral cough and chest wall tenderness Stable blood pressure Flatulence  May come from certain foods and may need to eliminate and test Try probiotic Follow up  Back pain from cough Increased gas-- gluten

## 2012-06-16 ENCOUNTER — Inpatient Hospital Stay (HOSPITAL_COMMUNITY)
Admission: EM | Admit: 2012-06-16 | Discharge: 2012-06-19 | DRG: 247 | Disposition: A | Discharge status: Home / Self Care | Payer: Medicare Other | Attending: Cardiology | Admitting: Cardiology

## 2012-06-16 ENCOUNTER — Inpatient Hospital Stay (HOSPITAL_COMMUNITY): Payer: Medicare Other

## 2012-06-16 ENCOUNTER — Encounter (HOSPITAL_COMMUNITY)
Admission: EM | Disposition: A | Discharge status: Home / Self Care | Payer: Self-pay | Source: Home / Self Care | Attending: Cardiology

## 2012-06-16 ENCOUNTER — Emergency Department (HOSPITAL_COMMUNITY): Payer: Medicare Other

## 2012-06-16 ENCOUNTER — Encounter (HOSPITAL_COMMUNITY): Payer: Self-pay | Admitting: Emergency Medicine

## 2012-06-16 DIAGNOSIS — E7800 Pure hypercholesterolemia, unspecified: Secondary | ICD-10-CM | POA: Diagnosis present

## 2012-06-16 DIAGNOSIS — F411 Generalized anxiety disorder: Secondary | ICD-10-CM | POA: Diagnosis present

## 2012-06-16 DIAGNOSIS — Z87891 Personal history of nicotine dependence: Secondary | ICD-10-CM

## 2012-06-16 DIAGNOSIS — I2109 ST elevation (STEMI) myocardial infarction involving other coronary artery of anterior wall: Principal | ICD-10-CM | POA: Diagnosis present

## 2012-06-16 DIAGNOSIS — I4510 Unspecified right bundle-branch block: Secondary | ICD-10-CM | POA: Diagnosis present

## 2012-06-16 DIAGNOSIS — F1010 Alcohol abuse, uncomplicated: Secondary | ICD-10-CM | POA: Diagnosis present

## 2012-06-16 DIAGNOSIS — I213 ST elevation (STEMI) myocardial infarction of unspecified site: Secondary | ICD-10-CM

## 2012-06-16 DIAGNOSIS — Z8249 Family history of ischemic heart disease and other diseases of the circulatory system: Secondary | ICD-10-CM

## 2012-06-16 DIAGNOSIS — Z955 Presence of coronary angioplasty implant and graft: Secondary | ICD-10-CM

## 2012-06-16 HISTORY — DX: Anxiety disorder, unspecified: F41.9

## 2012-06-16 HISTORY — PX: LEFT HEART CATH: SHX5478

## 2012-06-16 LAB — CBC WITH DIFFERENTIAL/PLATELET

## 2012-06-16 LAB — COMPREHENSIVE METABOLIC PANEL
Alkaline Phosphatase: 96 U/L (ref 39–117)
BUN: 12 mg/dL (ref 6–23)
Calcium: 8 mg/dL — ABNORMAL LOW (ref 8.4–10.5)
Creatinine, Ser: 0.77 mg/dL (ref 0.50–1.35)
GFR calc Af Amer: >90 mL/min (ref >90)
Glucose, Bld: 96 mg/dL (ref 70–99)
Total Protein: 6.3 g/dL (ref 6.0–8.3)

## 2012-06-16 LAB — LIPID PANEL
Cholesterol: 140 mg/dL (ref 0–200)
LDL Cholesterol: 85 mg/dL (ref 0–99)
Triglycerides: 61 mg/dL (ref <150)

## 2012-06-16 LAB — CBC
HCT: 33.5 % — ABNORMAL LOW (ref 39.0–52.0)
Hemoglobin: 12 g/dL — ABNORMAL LOW (ref 13.0–17.0)
MCH: 30.1 pg (ref 26.0–34.0)
MCHC: 35.8 g/dL (ref 30.0–36.0)
MCV: 84 fL (ref 78.0–100.0)
RDW: 13 % (ref 11.5–15.5)

## 2012-06-16 LAB — APTT: aPTT: >200 seconds (ref 24–37)

## 2012-06-16 LAB — CK TOTAL AND CKMB (NOT AT ARMC)
CK, MB: 13.3 ng/mL (ref 0.3–4.0)
Total CK: 196 U/L (ref 7–232)

## 2012-06-16 LAB — TROPONIN I: Troponin I: 1.68 ng/mL (ref <0.30)

## 2012-06-16 LAB — DIFFERENTIAL
Basophils Absolute: 0 10*3/uL (ref 0.0–0.1)
Eosinophils Absolute: 0.1 10*3/uL (ref 0.0–0.7)
Eosinophils Relative: 2 % (ref 0–5)

## 2012-06-16 LAB — POCT ACTIVATED CLOTTING TIME: Activated Clotting Time: 176 seconds

## 2012-06-16 SURGERY — LEFT HEART CATH
Anesthesia: LOCAL | Laterality: Bilateral

## 2012-06-16 MED ORDER — HEPARIN SODIUM (PORCINE) 1000 UNIT/ML IJ SOLN
4000.0000 [IU] | Freq: Once | INTRAMUSCULAR | Status: AC
  Administered 2012-06-16: 4000 [IU] via INTRAVENOUS

## 2012-06-16 MED ORDER — HEPARIN (PORCINE) IN NACL 2-0.9 UNIT/ML-% IJ SOLN
INTRAMUSCULAR | Status: AC
  Filled 2012-06-16: qty 1000

## 2012-06-16 MED ORDER — NITROGLYCERIN 0.4 MG SL SUBL: 0.4000 mg | SUBLINGUAL_TABLET | SUBLINGUAL | Status: DC | PRN

## 2012-06-16 MED ORDER — ONDANSETRON HCL 4 MG/2ML IJ SOLN: 4.0000 mg | Freq: Four times a day (QID) | INTRAMUSCULAR | Status: DC | PRN

## 2012-06-16 MED ORDER — PANTOPRAZOLE SODIUM 40 MG PO TBEC
40.0000 mg | DELAYED_RELEASE_TABLET | Freq: Every day | ORAL | Status: DC
  Administered 2012-06-17 – 2012-06-19 (×3): 40 mg via ORAL
  Filled 2012-06-16 (×3): qty 1

## 2012-06-16 MED ORDER — ACETAMINOPHEN 325 MG PO TABS: 650.0000 mg | ORAL_TABLET | ORAL | Status: DC | PRN

## 2012-06-16 MED ORDER — MIDAZOLAM HCL 2 MG/2ML IJ SOLN
INTRAMUSCULAR | Status: AC
  Filled 2012-06-16: qty 2

## 2012-06-16 MED ORDER — CARVEDILOL 3.125 MG PO TABS
3.1250 mg | ORAL_TABLET | Freq: Two times a day (BID) | ORAL | Status: DC
  Administered 2012-06-17 – 2012-06-18 (×3): 3.125 mg via ORAL
  Filled 2012-06-16 (×5): qty 1

## 2012-06-16 MED ORDER — SODIUM CHLORIDE 0.9 % IV SOLN
INTRAVENOUS | Status: AC
  Administered 2012-06-16: 22:00:00 via INTRAVENOUS

## 2012-06-16 MED ORDER — RAMIPRIL 1.25 MG PO CAPS
1.2500 mg | ORAL_CAPSULE | Freq: Every day | ORAL | Status: DC
  Administered 2012-06-17 – 2012-06-19 (×3): 1.25 mg via ORAL
  Filled 2012-06-16 (×3): qty 1

## 2012-06-16 MED ORDER — EPTIFIBATIDE 75 MG/100ML IV SOLN
INTRAVENOUS | Status: AC
  Administered 2012-06-17: 2 ug/kg/min via INTRAVENOUS
  Filled 2012-06-16: qty 100

## 2012-06-16 MED ORDER — ACETAMINOPHEN 325 MG PO TABS
650.0000 mg | ORAL_TABLET | ORAL | Status: DC | PRN
  Administered 2012-06-17: 650 mg via ORAL
  Filled 2012-06-16: qty 2

## 2012-06-16 MED ORDER — FENTANYL CITRATE 0.05 MG/ML IJ SOLN
INTRAMUSCULAR | Status: AC
  Filled 2012-06-16: qty 2

## 2012-06-16 MED ORDER — LIDOCAINE HCL (PF) 1 % IJ SOLN
INTRAMUSCULAR | Status: AC
  Filled 2012-06-16: qty 30

## 2012-06-16 MED ORDER — NITROGLYCERIN 2 % TD OINT
1.0000 [in_us] | TOPICAL_OINTMENT | Freq: Once | TRANSDERMAL | Status: AC
  Administered 2012-06-16: 1 [in_us] via TOPICAL
  Filled 2012-06-16: qty 1

## 2012-06-16 MED ORDER — NITROGLYCERIN IN D5W 200-5 MCG/ML-% IV SOLN
5.0000 ug/min | INTRAVENOUS | Status: DC
  Administered 2012-06-16: 5 ug/min via INTRAVENOUS

## 2012-06-16 MED ORDER — TICAGRELOR 90 MG PO TABS
90.0000 mg | ORAL_TABLET | Freq: Two times a day (BID) | ORAL | Status: DC
  Administered 2012-06-17 – 2012-06-19 (×5): 90 mg via ORAL
  Filled 2012-06-16 (×7): qty 1

## 2012-06-16 MED ORDER — ATORVASTATIN CALCIUM 80 MG PO TABS
80.0000 mg | ORAL_TABLET | Freq: Every day | ORAL | Status: DC
  Administered 2012-06-17 – 2012-06-18 (×2): 80 mg via ORAL
  Filled 2012-06-16 (×3): qty 1

## 2012-06-16 MED ORDER — NITROGLYCERIN IN D5W 200-5 MCG/ML-% IV SOLN
INTRAVENOUS | Status: AC
  Filled 2012-06-16: qty 250

## 2012-06-16 MED ORDER — BIVALIRUDIN 250 MG IV SOLR
INTRAVENOUS | Status: AC
  Filled 2012-06-16: qty 250

## 2012-06-16 MED ORDER — ASPIRIN 81 MG PO CHEW
81.0000 mg | CHEWABLE_TABLET | Freq: Every day | ORAL | Status: DC
  Administered 2012-06-17 – 2012-06-19 (×3): 81 mg via ORAL
  Filled 2012-06-16 (×2): qty 1

## 2012-06-16 MED ORDER — NITROGLYCERIN 1 MG/10 ML FOR IR/CATH LAB
INTRA_ARTERIAL | Status: AC
  Filled 2012-06-16: qty 10

## 2012-06-16 MED ORDER — VERAPAMIL HCL 2.5 MG/ML IV SOLN
INTRAVENOUS | Status: AC
  Filled 2012-06-16: qty 2

## 2012-06-16 MED ORDER — ASPIRIN EC 81 MG PO TBEC
81.0000 mg | DELAYED_RELEASE_TABLET | Freq: Every day | ORAL | Status: DC
  Filled 2012-06-16: qty 1

## 2012-06-16 MED ORDER — TICAGRELOR 90 MG PO TABS: 90.0000 mg | ORAL_TABLET | Freq: Two times a day (BID) | ORAL | Status: DC

## 2012-06-16 MED ORDER — ALPRAZOLAM 0.25 MG PO TABS
0.2500 mg | ORAL_TABLET | Freq: Two times a day (BID) | ORAL | Status: DC | PRN
  Administered 2012-06-18: 0.25 mg via ORAL
  Filled 2012-06-16: qty 1

## 2012-06-16 MED ORDER — TICAGRELOR 90 MG PO TABS
ORAL_TABLET | ORAL | Status: AC
  Filled 2012-06-16: qty 2

## 2012-06-16 MED ORDER — EPTIFIBATIDE 75 MG/100ML IV SOLN
INTRAVENOUS | Status: AC
  Filled 2012-06-16: qty 100

## 2012-06-16 NOTE — ED Notes (Signed)
Pt to cath lab with RN and Dr Sharyn Lull, pt on zoll pads, family given pts belongings

## 2012-06-16 NOTE — ED Notes (Signed)
Pt to ED via [facility] with c/o chest pain.  Pt st's he was sitting in chair watching TV.  On EMS arrival pt st's felt like something sitting on his chest.  EMS gave pt NTG SL x's 3 and ASA 324mg .

## 2012-06-16 NOTE — ED Provider Notes (Signed)
History     CSN: [id number]  Arrival date & time 06/16/12  1906   First MD Initiated Contact with Patient 06/16/12 1920      Chief Complaint  Patient presents with  . Code STEMI    (Consider location/radiation/quality/duration/timing/severity/associated sxs/prior treatment) Patient is a 72 y.o. male presenting with chest pain. The history is provided by the patient and the EMS personnel.  Chest Pain Pain location:  Substernal area Pain quality: aching, dull and pressure   Pain radiates to:  Does not radiate Pain radiates to the back: no   Pain severity:  Severe Onset quality:  Sudden Duration:  60 minutes Timing:  Constant Progression:  Unchanged (resolved with NTG) Chronicity:  New Context: at rest   Relieved by:  Nitroglycerin Worsened by:  Nothing tried Ineffective treatments:  None tried Associated symptoms: diaphoresis and shortness of breath   Associated symptoms: no abdominal pain, no cough, no dizziness, no fever, no headache, no nausea, no numbness, no palpitations, not vomiting and no weakness     Past Medical History  Diagnosis Date  . Anxiety     History reviewed. No pertinent past surgical history.  Family History  Problem Relation Age of Onset  . Cancer Mother     oropharengial  . Heart disease Father     History  Substance Use Topics  . Smoking status: Former Smoker    Quit date: 03/04/2003  . Smokeless tobacco: Not on file  . Alcohol Use: Yes      Review of Systems  Constitutional: Positive for diaphoresis. Negative for fever, chills, activity change and appetite change.  HENT: Negative for neck pain.   Respiratory: Positive for chest tightness and shortness of breath. Negative for cough and wheezing.   Cardiovascular: Positive for chest pain. Negative for palpitations.  Gastrointestinal: Negative for nausea, vomiting, abdominal pain, diarrhea and constipation.  Skin: Negative for rash and wound.  Neurological: Negative for dizziness,  seizures, syncope, weakness, light-headedness, numbness and headaches.  All other systems reviewed and are negative.    Allergies  Review of patient's allergies indicates no known allergies.  Home Medications   Current Outpatient Rx  Name  Route  Sig  Dispense  Refill  . ALPRAZolam (XANAX) 0.5 MG tablet   Oral   Take 1 tablet (0.5 mg total) by mouth 3 (three) times daily as needed.   30 tablet   3   . aspirin 81 MG tablet   Oral   Take 81 mg by mouth daily.             BP 163/105  Temp(Src) 98.7 F (37.1 C) (Oral)  Resp 18  SpO2 97%  Physical Exam  Nursing note and vitals reviewed. Constitutional: He appears well-developed and well-nourished.  HENT:  Head: Normocephalic and atraumatic.  Right Ear: External ear normal.  Left Ear: External ear normal.  Nose: Nose normal.  Mouth/Throat: Oropharynx is clear and moist. No oropharyngeal exudate.  Eyes: Conjunctivae are normal. Pupils are equal, round, and reactive to light.  Neck: Normal range of motion. Neck supple.  Cardiovascular: Normal rate, regular rhythm, normal heart sounds and intact distal pulses.   Pulmonary/Chest: Effort normal and breath sounds normal. No respiratory distress. He has no wheezes. He has no rales. He exhibits no tenderness.  Abdominal: Soft. Bowel sounds are normal. He exhibits no distension and no mass. There is no tenderness. There is no rebound and no guarding.  Musculoskeletal: Normal range of motion. He exhibits no edema and no  tenderness.  Neurological: He is alert. He displays normal reflexes. No cranial nerve deficit. He exhibits normal muscle tone. Coordination normal.  Skin: Skin is warm. No rash noted. He is diaphoretic. No erythema. No pallor.  Psychiatric: He has a normal mood and affect. His behavior is normal. Judgment and thought content normal.    ED Course  Procedures (including critical care time)  Labs Reviewed  CBC - Abnormal; Notable for the following:    RBC 3.99  (*)    Hemoglobin 12.0 (*)    HCT 33.5 (*)    All other components within normal limits  COMPREHENSIVE METABOLIC PANEL - Abnormal; Notable for the following:    Sodium 134 (*)    Calcium 8.0 (*)    Albumin 3.2 (*)    AST 98 (*)    GFR calc non Af Amer 89 (*)    All other components within normal limits  PROTIME-INR - Abnormal; Notable for the following:    Prothrombin Time 66.3 (*)    INR 8.93 (*)    All other components within normal limits  APTT - Abnormal; Notable for the following:    aPTT >200 (*)    All other components within normal limits  CK TOTAL AND CKMB - Abnormal; Notable for the following:    CK, MB 13.3 (*)    Relative Index 6.8 (*)    All other components within normal limits  TROPONIN I - Abnormal; Notable for the following:    Troponin I 1.68 (*)    All other components within normal limits  MRSA PCR SCREENING  LIPID PANEL  CBC WITH DIFFERENTIAL  DIFFERENTIAL  CBC WITH DIFFERENTIAL  HEMOGLOBIN A1C  CBC  BASIC METABOLIC PANEL  TROPONIN I  TROPONIN I  TSH  POCT ACTIVATED CLOTTING TIME   Dg Chest Portable 1 View  06/16/2012  *RADIOLOGY REPORT*  Clinical Data: Chest pain.  Acute myocardial infarction.  PORTABLE CHEST - 1 VIEW  Comparison: None.  Findings: Heart size and pulmonary vascularity are normal and the lungs are clear.  No acute osseous abnormality.  IMPRESSION: No acute abnormality.   Original Report Authenticated By: Francene Boyers, M.D.      1. STEMI (ST elevation myocardial infarction)     Date: 06/17/2012  Rate: 91 bpm  Rhythm: normal sinus rhythm  QRS Axis: normal  Intervals: normal  ST/T Wave abnormalities: ST elevations anteriorly  Conduction Disutrbances:right bundle branch block  Narrative Interpretation: ST Elevation Myocardial Infarction in anterior leads  Old EKG Reviewed: changes noted; ST elevation in anterior leads new     MDM  72 yo M w/no significant past medical history presents for 1 hr of substernal chest pressure.  EKG PTA revealed ST elevation in anterior leads with reciprocal changes in inferior leads. Pt CODE STEMI prior to arrival and Cardiology and Cath lab called prior to arrival. EKG here confirms anterior STEMI. Pt chest-pain free on arrival after NTG x 3. 325mg  ASA given via EMS en route. Nitropaste applied; Heparin bolused. Labs drawn and pt taken emergently to cath lab.         Clemetine Marker, MD 06/17/12 [phone]

## 2012-06-16 NOTE — ED Provider Notes (Signed)
72 year old male patient with no past medical history no cardiac history presents with chest pain that started about one hour prior to arrival. He was given sublingual nitroglycerin and aspirin per EMS he is essentially pain-free now. His EKG shows ST elevation anteriorly and a code STEMI was initiated by EMS. I discussed the patient with Dr. Sharyn Lull and he is to go to the Cath Lab.  CRITICAL CARE Performed by: Breonna Gafford   Total critical care time: 15  Critical care time was exclusive of separately billable procedures and treating other patients.  Critical care was necessary to treat or prevent imminent or life-threatening deterioration.  Critical care was time spent personally by me on the following activities: development of treatment plan with patient and/or surrogate as well as nursing, discussions with consultants, evaluation of patient's response to treatment, examination of patient, obtaining history from patient or surrogate, ordering and performing treatments and interventions, ordering and review of laboratory studies, ordering and review of radiographic studies, pulse oximetry and re-evaluation of patient's condition.   Randall Bucco, MD 06/16/12 [phone]

## 2012-06-16 NOTE — CV Procedure (Signed)
Cardiac cath/PTCA stenting report dictated on 06/16/2012 dictation number is [id number]

## 2012-06-16 NOTE — H&P (Signed)
Randall [name] is an 72 y.o. male.   Chief Complaint: Chest pain HPI: Patient is 72 year old male with no significant past medical history except for tobacco abuse alcohol abuse strong family history of coronary artery disease father died of MI in his 20s anxiety disorder, came to the ER by EMS complaining of retrosternal chest pain described as pressure which started early this morning off and on lasted a few minutes and around 6:00 her to develop severe chest pain her localized without associated nausea vomiting diaphoresis. EKG done by EMS showed the normal sinus rhythm with the very small Q waves in V1 and V2 with marked ST elevation in V1 to her V4 and lead 1 and aVL with right bundle branch block pattern suggestive of acute anterolateral wall injury. Patient received a aspirin and sublingual nitroglycerin with partial relief of chest pain for semi-was called and patient was brought to Cath Lab for emergency PCI. Patient denies any palpitation lightheadedness or syncope. Denies any shortness of breath. Denies history of PND orthopnea leg swelling.  Past Medical History  Diagnosis Date  . Anxiety     History reviewed. No pertinent past surgical history.  Family History  Problem Relation Age of Onset  . Cancer Mother     oropharengial  . Heart disease Father    Social History:  reports that he quit smoking about 9 years ago. He does not have any smokeless tobacco history on file. He reports that  drinks alcohol. He reports that he does not use illicit drugs.  Allergies: No Known Allergies  Medications Prior to Admission  Medication Sig Dispense Refill  . ALPRAZolam (XANAX) 0.5 MG tablet Take 1 tablet (0.5 mg total) by mouth 3 (three) times daily as needed.  30 tablet  3  . aspirin 81 MG tablet Take 81 mg by mouth daily.          Results for orders placed during the hospital encounter of 06/16/12 (from the past 48 hour(s))  CBC     Status: Abnormal   Collection Time    06/16/12  8:20  PM      Result Value Range   WBC 8.7  4.0 - 10.5 K/uL   RBC 3.99 (*) 4.22 - 5.81 MIL/uL   Hemoglobin 12.0 (*) 13.0 - 17.0 g/dL   HCT 16.1 (*) 09.6 - 04.5 %   MCV 84.0  78.0 - 100.0 fL   MCH 30.1  26.0 - 34.0 pg   MCHC 35.8  30.0 - 36.0 g/dL   RDW 40.9  81.1 - 91.4 %   Platelets 186  150 - 400 K/uL   No results found.  Review of Systems  Constitutional: Negative for fever and chills.  Eyes: Negative for double vision and photophobia.  Cardiovascular: Positive for chest pain. Negative for palpitations, orthopnea and claudication.  Gastrointestinal: Negative for nausea, vomiting and abdominal pain.  Genitourinary: Negative for dysuria.  Neurological: Negative for dizziness and headaches.    Blood pressure 163/105, temperature 98.7 F (37.1 C), temperature source Oral, resp. rate 18, SpO2 97.00%. Physical Exam  Constitutional: He is oriented to person, place, and time.  HENT:  Head: Normocephalic and atraumatic.  Eyes: Conjunctivae are normal. Pupils are equal, round, and reactive to light. Left eye exhibits no discharge.  Neck: Normal range of motion. No JVD present. No tracheal deviation present. No thyromegaly present.  Cardiovascular:  Normal rate and rhythm S1-S2 normal there was soft S4 gallop and soft systolic murmur  Respiratory: Effort normal  and breath sounds normal. No respiratory distress. He has no wheezes. He has no rales.  GI: Soft. Bowel sounds are normal. He exhibits no distension. There is no tenderness.  Musculoskeletal: He exhibits no edema and no tenderness.  Neurological: He is alert and oriented to person, place, and time.     Assessment/Plan Acute anterolateral wall myocardial infarction Tobacco abuse Positive family history of coronary artery disease EtOH abuse Anxiety disorder Plan Discussed with patient and his wife regarding emergency left cath possible PTCA stenting its risk and benefits i.e. death MI stroke need for emergency CABG local last  complications etc. and consents for PCI [facility] 06/16/2012, 9:10 PM

## 2012-06-16 NOTE — Progress Notes (Signed)
Chaplain Note:  Chaplain responded to code STEMI page received at 18:53.  Pt was in cath lab being treated by cath lab team.  Pt's family was in cath lab waiting room.  Chaplain provided spiritual comfort and support for pt and pt's family.  Chaplain supported staff by acting as liaison between lab and pt's family during pt's cath procedure.  Pt, family, and cath lab staff expressed appreciation for chaplain support.  Chaplain will follow up as needed.  06/16/12 1853  Clinical Encounter Type  Visited With Patient;Family;Health care provider  Visit Type Spiritual support  Referral From Other (Comment) (Code STEMI page)  Spiritual Encounters  Spiritual Needs Emotional  Stress Factors  Patient Stress Factors Health changes;Major life changes  Family Stress Factors Major life changes  Randall [name], [place] [phone]

## 2012-06-17 LAB — BASIC METABOLIC PANEL
BUN: 11 mg/dL (ref 6–23)
Calcium: 8.5 mg/dL (ref 8.4–10.5)
Creatinine, Ser: 0.73 mg/dL (ref 0.50–1.35)
GFR calc Af Amer: >90 mL/min (ref >90)

## 2012-06-17 LAB — HEMOGLOBIN A1C
Hgb A1c MFr Bld: 6 % — ABNORMAL HIGH (ref <5.7)
Mean Plasma Glucose: 126 mg/dL — ABNORMAL HIGH (ref <117)

## 2012-06-17 LAB — POCT ACTIVATED CLOTTING TIME
Activated Clotting Time: 442 seconds
Activated Clotting Time: 160 seconds
Activated Clotting Time: 149 seconds

## 2012-06-17 LAB — POCT I-STAT, CHEM 8
Creatinine, Ser: 0.9 mg/dL (ref 0.50–1.35)
Hemoglobin: 12.6 g/dL — ABNORMAL LOW (ref 13.0–17.0)
Sodium: 138 mEq/L (ref 135–145)
TCO2: 19 mmol/L (ref 0–100)

## 2012-06-17 LAB — TROPONIN I: Troponin I: >20 ng/mL (ref <0.30)

## 2012-06-17 LAB — MRSA PCR SCREENING: MRSA by PCR: NEGATIVE

## 2012-06-17 LAB — TSH: TSH: 2.512 u[IU]/mL (ref 0.350–4.500)

## 2012-06-17 LAB — CBC
MCH: 30 pg (ref 26.0–34.0)
MCV: 84.4 fL (ref 78.0–100.0)
Platelets: 178 10*3/uL (ref 150–400)
RDW: 13 % (ref 11.5–15.5)

## 2012-06-17 MED ORDER — EPTIFIBATIDE 75 MG/100ML IV SOLN
2.0000 ug/kg/min | INTRAVENOUS | Status: AC
  Administered 2012-06-17: 2 ug/kg/min via INTRAVENOUS
  Filled 2012-06-17 (×2): qty 100

## 2012-06-17 MED ORDER — SODIUM CHLORIDE 0.9 % IV SOLN
INTRAVENOUS | Status: DC
  Administered 2012-06-17: 06:00:00 via INTRAVENOUS

## 2012-06-17 MED ORDER — ATROPINE SULFATE 1 MG/ML IJ SOLN
INTRAMUSCULAR | Status: AC
  Filled 2012-06-17: qty 1

## 2012-06-17 MED FILL — Sodium Chloride IV Soln 0.9%: INTRAVENOUS | Qty: 50 | Status: AC

## 2012-06-17 NOTE — Progress Notes (Signed)
  Echocardiogram 2D Echocardiogram has been performed.  Georgian Co 06/17/2012, 3:05 PM

## 2012-06-17 NOTE — Progress Notes (Signed)
Right femoral sheath pulled at 0325.  Prior to sheath removal site was a level 0.  Manual pressure applied for 25 minutes.  Hemostasis achieved at 0350.  Site is a level 1 with minimal bruising around site.  Vital signs stable throughout procedure.  Right pedal pulse 2+.  Post cath instructions given and patient verbalized understanding.  Will continue to monitor closely.  Dara Hoyer

## 2012-06-17 NOTE — Cardiovascular Report (Signed)
NAMEELIYOHU, CLASS NO.:  [id number]  MEDICAL RECORD NO.:  [id number]  LOCATION:  2913                         FACILITY:  [facility]  PHYSICIAN:  Eduardo Osier. Sharyn Lull, M.D. DATE OF BIRTH:  [birth year]  DATE OF PROCEDURE:  06/16/2012 DATE OF DISCHARGE:                           CARDIAC CATHETERIZATION   PROCEDURE: 1. Left cardiac cath with selective left and right coronary     angiography, left ventriculography via right groin using Judkins     technique. 2. Successful PTCA to proximal LAD using 2.5 x 15 mm long Emerge     balloon. 3. Successful deployment of 3.5 x long XIENCE Xpedition drug-eluting     stent in proximal LAD. 4. Successful postdilatation of the stent using 3.75 x 15-mm long Mitchell     TREK balloon.  INDICATION FOR THE PROCEDURE:  Mr. Bordonaro is 72 year old male with no past medical history except for tobacco abuse, alcohol abuse, strong family history of coronary artery disease.  Father died of MI in his 75s.  Anxiety disorder.  He came to the ER via EMS complaining of retrosternal chest pain described as pressure, which started early this morning, off and on lasting few minutes, and around 6:00 a.m. he developed severe chest pain localized without associated nausea, vomiting, diaphoresis.  EKG done by EMS showed normal sinus rhythm with small Q-waves in V1 and V2, with marked ST elevation in V1 to V4, and lead 1 and aVL with right bundle-branch block pattern suggestive of acute anterolateral wall injury.  The patient received aspirin and sublingual nitro with partial relief of chest pain.  Code STEMI was called and the patient was brought to the cath Lab for emergency PCI. After obtaining the informed consent, the patient was brought to the cath lab and was placed on fluoroscopy table.  Right groin was prepped and draped in usual fashion.  Xylocaine 1% was used for local anesthesia in the right groin.  With the help of thin wall needle,  6-French arterial sheath was placed.  The sheath was aspirated and flushed. Next, 6-French right Judkins catheter was advanced over the wire under fluoroscopic guidance up to the ascending aorta.  Wire was pulled out. The catheter was aspirated and connected to the Manifold.  Catheter was further advanced and engaged into right coronary ostium.  Multiple views of the right system were obtained.  Next, the catheter was disengaged and was pulled out over the wire and was replaced with 6-French XBLAD guiding catheter, which was advanced over the wire under fluoroscopic guidance up to the ascending aorta.  Wire was pulled out.  The catheter was aspirated and connected to the Manifold.  Catheter was further advanced and engaged into left coronary ostium.  Multiple views of the left system were taken.  Next, catheter was disengaged at the end of the procedure and was replaced with 6-French pigtail catheter, which was advanced over the wire under fluoroscopic guidance up to the ascending aorta.  Wire was pulled out.  The catheter was aspirated and connected to the Manifold.  Catheter was further advanced across the aortic valve into the LV.  LV pressures were recorded.  Next, LV graft was done in  30- degree RAO position.  Post-angiographic pressures were recorded from LV and then pullback pressures were recorded from the aorta.  There was no gradient across the aortic valve.  Next, pigtail catheter was pulled out over the wire.  Sheaths were aspirated and flushed.  FINDINGS:  LV showed anterolateral wall severe hypokinesia, EF of approximately 45%.  Left main has 10-15% distal stenosis.  LAD was 100% occluded just beyond the ostium.  Ramus has 15-20% proximal stenosis, which is moderate size vessel.  Left circumflex has 20-25% ostial, proximal, and mid stenosis.  OM1 has 20-30% proximal stenosis.  OM2 has 15-20% proximal stenosis.  RCA has 20-25% proximal stenosis and 60-70% mid stenosis.  PDA  and PLV branches were patent.  There were no collaterals to LAD from RCA.  INTERVENTIONAL PROCEDURE:  Successful PTCA to proximal LAD was done using 2.5 x 15 mm long Emerge balloon for predilatation and then 3.5 x 28 mm long XIENCE Xpedition drug-eluting stent was deployed at 12 atmospheric pressure.  The stent was post dilated using 3.75 x 15-mm long [place] TREK balloon going up to 15-16 atmospheric pressure.  Lesion dilated from 100% to 0% residual.  Angiogram showed very small amount of distal embolization in the apical LAD.  The patient received intracoronary nitro infusion and verapamil, and was started on Integrilin during the procedure.  The patient tolerated the procedure well.  The patient received weight based Angiomax and also 180 mg of Brilinta during the procedure.  The patient tolerated the procedure well.  The patient was chest pain-free after the procedure.  There were no complications.  The patient was transferred to CCU in stable condition.     Eduardo Osier. Sharyn Lull, M.D.     MNH/MEDQ  D:  06/16/2012  T:  06/17/2012  Job:  [id number]

## 2012-06-17 NOTE — Progress Notes (Signed)
Subjective:  Patient denies any chest pain or shortness of breath. States feels better.  Objective:  Vital Signs in the last 24 hours: Temp:  [97.7 F (36.5 C)-98.7 F (37.1 C)] 98.4 F (36.9 C) (04/17 0900) Pulse Rate:  [67-89] 84 (04/17 0900) Resp:  [8-18] 18 (04/17 0900) BP: (123-163)/(72-105) 139/72 mmHg (04/17 0900) SpO2:  [94 %-98 %] 94 % (04/17 0900) Weight:  [78.9 kg (173 lb 15.1 oz)] 78.9 kg (173 lb 15.1 oz) (04/16 2115)  Intake/Output from previous day: 04/16 0701 - 04/17 0700 In: 1638.7 [P.O.:240; I.V.:1398.7] Out: 2050 [Urine:2050] Intake/Output from this shift: Total I/O In: -  Out: 600 [Urine:600]  Physical Exam: Neck: no adenopathy, no carotid bruit, no JVD and supple, symmetrical, trachea midline Lungs: clear to auscultation bilaterally Heart: regular rate and rhythm, S1, S2 normal and Soft systolic murmur noted no S3 gallop Abdomen: soft, non-tender; bowel sounds normal; no masses,  no organomegaly Extremities: extremities normal, atraumatic, no cyanosis or edema and Right groin stable no evidence of hematoma  Lab Results:  Recent Labs  06/16/12 2200 06/17/12 0315  WBC TEST CANCELLED PER RN NICOLE RN @ 2142 8.5  HGB TEST CANCELLED PER RN NICOLE RN @ 2142 12.1*  PLT TEST CANCELLED PER RN NICOLE RN @ 2142 178    Recent Labs  06/16/12 2020 06/17/12 0315  NA 134* 136  K 3.9 4.0  CL 101 104  CO2 24 21  GLUCOSE 96 122*  BUN 12 11  CREATININE 0.77 0.73    Recent Labs  06/16/12 2020 06/17/12 0315  TROPONINI 1.68* >20.00*   Hepatic Function Panel  Recent Labs  06/16/12 2020  PROT 6.3  ALBUMIN 3.2*  AST 98*  ALT 32  ALKPHOS 96  BILITOT 0.3    Recent Labs  06/16/12 2020  CHOL 140   No results found for this basename: PROTIME,  in the last 72 hours  Imaging: Imaging results have been reviewed and Dg Chest Portable 1 View  06/16/2012  *RADIOLOGY REPORT*  Clinical Data: Chest pain.  Acute myocardial infarction.  PORTABLE CHEST - 1  VIEW  Comparison: None.  Findings: Heart size and pulmonary vascularity are normal and the lungs are clear.  No acute osseous abnormality.  IMPRESSION: No acute abnormality.   Original Report Authenticated By: Francene Boyers, M.D.     Cardiac Studies:  Assessment/Plan:  Acute anterolateral wall myocardial infarction status post PTCA stenting 200% occluded LAD with excellent result Multivessel CAD Hypercholesteremia Tobacco abuse  Positive family history of coronary artery disease  EtOH abuse  Anxiety disorder  Plan Continue present management Check labs in a.m. Phase I cardiac rehabilitation DC IV nitroglycerin and Integrilin later today  LOS: 1 day    Randall [name] N 06/17/2012, 9:49 AM

## 2012-06-17 NOTE — Progress Notes (Signed)
CARDIAC REHAB PHASE I   PRE:  Rate/Rhythm: 83SR  BP:  Supine: 103/56  Sitting:   Standing:    SaO2: 96%RA  MODE:  Ambulation: 350 ft   POST:  Rate/Rhythm: 100SR  BP:  Supine:   Sitting: 131/76  Standing:    SaO2: 95%RA 1315-1420 Pt walked 350 ft on RA with hand held asst. Denied CP. Tolerated well. Education with pt and family completed except diet. Back to bed since pt stated he was going to get ECHO soon. Discussed CRP 2 and pt gave permission to refer to [place]. Will complete ed tomorrow.   Luetta Nutting, RN BSN  06/17/2012 2:14 PM

## 2012-06-17 NOTE — Progress Notes (Signed)
CRITICAL VALUE ALERT  Critical value received:  CKMB 13.3 Troponin 1.68  INR 8.93   PTT >200  Date of notification:  06/16/12  Time of notification:  0930  Critical value read back:yes  Nurse who received alert:  Park Pope, RN  MD notified (1st page):  Dr. Sharyn Lull  Time of first page:  [phone]  MD notified (2nd page):  Time of second page:  Responding MD:  Dr. Sharyn Lull  Time MD responded:  [phone]

## 2012-06-17 NOTE — ED Provider Notes (Signed)
Medical screening examination/treatment/procedure(s) were conducted as a shared visit with non-physician practitioner(s) and myself.  I personally evaluated the patient during the encounter PT with chest pain starting today at 6pm.  +STEMI, taken to cath lab by Dr Lowella Dell, MD 06/17/12 [phone]

## 2012-06-17 NOTE — Care Management Note (Signed)
    Page 1 of 1   06/17/2012     9:34:07 AM   CARE MANAGEMENT NOTE 06/17/2012  Patient:  Randall [name], Randall [name]   Account Number:  [id number]  Date Initiated:  06/17/2012  Documentation initiated by:  Junius Creamer  Subjective/Objective Assessment:   adm w mi     Action/Plan:   lives alone, pcp dr Oliver Barre   Anticipated DC Date:     Anticipated DC Plan:        DC Planning Services  CM consult  Medication Assistance      Choice offered to / List presented to:             Status of service:   Medicare Important Message given?   (If response is "NO", the following Medicare IM given date fields will be blank) Date Medicare IM given:   Date Additional Medicare IM given:    Discharge Disposition:    Per UR Regulation:  Reviewed for med. necessity/level of care/duration of stay  If discussed at Long Length of Stay Meetings, dates discussed:    Comments:  4/27 0830 debbie Ekansh Sherk rn,bsn gave pt brilinta 30day free and copay assist card. has 45.00 per month copay.

## 2012-06-18 LAB — CBC
MCH: 30.3 pg (ref 26.0–34.0)
Platelets: 189 10*3/uL (ref 150–400)
RBC: 4.23 MIL/uL (ref 4.22–5.81)
WBC: 9.3 10*3/uL (ref 4.0–10.5)

## 2012-06-18 LAB — TROPONIN I: Troponin I: >20 ng/mL (ref <0.30)

## 2012-06-18 LAB — BASIC METABOLIC PANEL
CO2: 27 mEq/L (ref 19–32)
Calcium: 9 mg/dL (ref 8.4–10.5)
Sodium: 138 mEq/L (ref 135–145)

## 2012-06-18 LAB — MAGNESIUM: Magnesium: 2.2 mg/dL (ref 1.5–2.5)

## 2012-06-18 MED ORDER — CARVEDILOL 6.25 MG PO TABS
6.2500 mg | ORAL_TABLET | Freq: Two times a day (BID) | ORAL | Status: DC
  Administered 2012-06-18 – 2012-06-19 (×2): 6.25 mg via ORAL
  Filled 2012-06-18 (×4): qty 1

## 2012-06-18 MED ORDER — PNEUMOCOCCAL VAC POLYVALENT 25 MCG/0.5ML IJ INJ
0.5000 mL | INJECTION | Freq: Once | INTRAMUSCULAR | Status: DC
  Filled 2012-06-18: qty 0.5

## 2012-06-18 NOTE — Progress Notes (Signed)
Subjective:  Doing well denies any chest pain shortness of breath or palpitation. Had episode of wide-complex tachycardia nonsustained asymptomatic last night and few beats this morning asymptomatic. In room walking without any complaints  Objective:  Vital Signs in the last 24 hours: Temp:  [98.1 F (36.7 C)-98.7 F (37.1 C)] 98.5 F (36.9 C) (04/18 0750) Pulse Rate:  [25-96] 96 (04/18 0750) Resp:  [16-28] 21 (04/18 0435) BP: (107-143)/(66-94) 129/68 mmHg (04/18 0915) SpO2:  [94 %-99 %] 97 % (04/18 0750)  Intake/Output from previous day: 04/17 0701 - 04/18 0700 In: 816.2 [P.O.:610; I.V.:206.2] Out: 2325 [Urine:2325] Intake/Output from this shift: Total I/O In: 200 [P.O.:200] Out: -   Physical Exam: Neck: no adenopathy, no carotid bruit, no JVD and supple, symmetrical, trachea midline Lungs: clear to auscultation bilaterally Heart: regular rate and rhythm, S1, S2 normal and Soft systolic murmur noted no S3 gallop Abdomen: soft, non-tender; bowel sounds normal; no masses,  no organomegaly Extremities: extremities normal, atraumatic, no cyanosis or edema and Right groin stable  Lab Results:  Recent Labs  06/17/12 0315 06/18/12 0455  WBC 8.5 9.3  HGB 12.1* 12.8*  PLT 178 189    Recent Labs  06/17/12 0315 06/18/12 0455  NA 136 138  K 4.0 4.4  CL 104 103  CO2 21 27  GLUCOSE 122* 125*  BUN 11 13  CREATININE 0.73 0.92    Recent Labs  06/17/12 0910 06/18/12 0455  TROPONINI >20.00* >20.00*   Hepatic Function Panel  Recent Labs  06/16/12 2020  PROT 6.3  ALBUMIN 3.2*  AST 98*  ALT 32  ALKPHOS 96  BILITOT 0.3    Recent Labs  06/16/12 2020  CHOL 140   No results found for this basename: PROTIME,  in the last 72 hours  Imaging: Imaging results have been reviewed and Dg Chest Portable 1 View  06/16/2012  *RADIOLOGY REPORT*  Clinical Data: Chest pain.  Acute myocardial infarction.  PORTABLE CHEST - 1 VIEW  Comparison: None.  Findings: Heart size and  pulmonary vascularity are normal and the lungs are clear.  No acute osseous abnormality.  IMPRESSION: No acute abnormality.   Original Report Authenticated By: Francene Boyers, M.D.     Cardiac Studies:  Assessment/Plan:  Acute anterolateral wall myocardial infarction status post PTCA stenting to 100% occluded LAD with excellent result  Multivessel CAD  Status post nonsustained wide-complex tachycardia asymptomatic Hypercholesteremia  Tobacco abuse  Positive family history of coronary artery disease  EtOH abuse  Anxiety disorder  Plan Increase Coreg as per orders Check labs later this afternoon if cardiac enzymes trending down will DC home later this evening otherwise tomorrow if stable  LOS: 2 days    Audelia Knape N 06/18/2012, 10:25 AM

## 2012-06-18 NOTE — Progress Notes (Signed)
Pt. while resting and sleeping had episode of sinus tach 130's-150's nonsustained and back to SR, pt. denies any discomfort or pain. BP stable Dr. Sharyn Lull made aware with orders made. Will cont. to monitor pt.

## 2012-06-18 NOTE — Progress Notes (Signed)
[phone] Pt just walked with daughter 700 ft without difficulty so I did not walk. To walk with family later if not d/ced. Completed diet ed. Understanding voiced. Luetta Nutting RN BSN

## 2012-06-18 NOTE — Progress Notes (Signed)
Per MD office note pt had Pneumonia vaccine in 2007 when he was 64.  Will D/C vaccine order.

## 2012-06-19 MED ORDER — NITROGLYCERIN 0.4 MG SL SUBL: 0.4000 mg | SUBLINGUAL_TABLET | SUBLINGUAL | Status: AC | PRN

## 2012-06-19 MED ORDER — ATORVASTATIN CALCIUM 80 MG PO TABS: 80.0000 mg | ORAL_TABLET | Freq: Every day | ORAL | Status: DC

## 2012-06-19 MED ORDER — RAMIPRIL 1.25 MG PO CAPS: 1.2500 mg | ORAL_CAPSULE | Freq: Every day | ORAL | Status: DC

## 2012-06-19 MED ORDER — TICAGRELOR 90 MG PO TABS: 90.0000 mg | ORAL_TABLET | Freq: Two times a day (BID) | ORAL | Status: DC

## 2012-06-19 MED ORDER — CARVEDILOL 6.25 MG PO TABS: 6.2500 mg | ORAL_TABLET | Freq: Two times a day (BID) | ORAL | Status: DC

## 2012-06-19 NOTE — Progress Notes (Signed)
Discharge instructions given. PIVs removed.  Will discharge to home per orders.

## 2012-06-19 NOTE — Discharge Summary (Signed)
Physician Discharge Summary  Patient ID: Randall [name] MRN: [id number] DOB/AGE: [birth year] 72 y.o.  Admit date: 06/16/2012 Discharge date: 06/19/2012  Admission Diagnoses: Acute anterolateral wall myocardial infarction  Status post PTCA stenting to 100% occluded LAD  Multivessel CAD  Status post nonsustained wide-complex tachycardia   Hypercholesteremia  Tobacco abuse  Family history of coronary artery disease  ETOH abuse  Anxiety disorder   Discharge Diagnoses:  Active Problems:   * No active hospital problems. * Acute anterolateral wall myocardial infarction  Status post PTCA stenting to 100% occluded LAD  Multivessel CAD  Status post nonsustained wide-complex tachycardia asymptomatic  Hypercholesteremia  Tobacco abuse  Family history of coronary artery disease  ETOH abuse  Anxiety disorder    Discharged Condition: good  Hospital Course: Patient is 72 year old male with significant past medical history for tobacco abuse, alcohol abuse, strong family history of coronary artery disease (father died of MI in his 73s anxiety disorder), came to the ER by EMS complaining of recurrent retrosternal chest pain associated with nausea, vomiting and diaphoresis on 06/16/2012. EKG done by EMS showed the normal sinus rhythm with the very small Q waves in V1 and V2 with marked ST elevation in V1 to her V4 and lead 1 and aVL with right bundle branch block pattern suggestive of acute anterolateral wall injury. Patient received a aspirin and sublingual nitroglycerin with partial relief of chest pain. STEMI was called and patient was brought to Cath Lab for emergency PCI.  PTCA stenting of 100% occluded LAD was done with excellent result. He was monitored additional 24 hours for non-sustained VT and elevated troponin I. He was discharged home in stable condition with follow up in 1-2 weeks by Dr. Sharyn Lull and primary care doctor.   Consults: None  Significant Diagnostic Studies: labs: Normal  elctrolytes and Hgb. Markedly elevated troponin I.  Coronary angiography showing multivessel CAD and 100 % occluded LAD.  Treatments: cardiac meds: ramipril (Altace) and carvedilol and procedures: LAD stenting with good result.  Discharge Exam: Blood pressure 112/66, pulse 96, temperature 98.4 F (36.9 C), temperature source Oral, resp. rate 18, height 5' 8.5" (1.74 m), weight 78.9 kg (173 lb 15.1 oz), SpO2 94.00%. Constitutional: He is oriented to person, place, and time.  HENT: Head: Normocephalic and atraumatic.  Eyes: Conjunctivae are normal. Pupils are equal, round, and reactive to light.  Neck: Normal range of motion. No JVD present. No tracheal deviation present. No thyromegaly present.  Cardiovascular: Normal rate and rhythm S1-S2 normal there was soft S4 gallop and soft systolic murmur  Respiratory: Effort normal and breath sounds normal. No respiratory distress. He has no wheezes. He has no rales.  GI: Soft. Bowel sounds are normal. He exhibits no distension. There is no tenderness.  Musculoskeletal: He exhibits no edema and no tenderness. Mild right groin ecchymosis. Neurological: He is alert and oriented to person, place, and time.    Disposition: 01, Home, self care.  Discharge Orders   Future Appointments Provider Department Dept Phone   09/13/2012 8:15 AM [facility] [place] [phone]   09/20/2012 8:30 AM Stacie Glaze, MD [facility] [place] [phone]   Future Orders Complete By Expires     Amb Referral to Cardiac Rehabilitation  As directed         Medication List    STOP taking these medications       MOTRIN PO      TAKE these medications       ALPRAZolam 0.5  MG tablet  Commonly known as:  XANAX  Take 0.5 mg by mouth 3 (three) times daily as needed for anxiety.     aspirin EC 81 MG tablet  Take 81 mg by mouth daily.     atorvastatin 80 MG tablet  Commonly known as:  LIPITOR  Take 1 tablet (80 mg total) by  mouth daily at 6 PM.     carvedilol 6.25 MG tablet  Commonly known as:  COREG  Take 1 tablet (6.25 mg total) by mouth 2 (two) times daily with a meal.     nitroGLYCERIN 0.4 MG SL tablet  Commonly known as:  NITROSTAT  Place 1 tablet (0.4 mg total) under the tongue every 5 (five) minutes x 3 doses as needed for chest pain.     PROBIOTIC Caps  Take 1 capsule by mouth daily.     ramipril 1.25 MG capsule  Commonly known as:  ALTACE  Take 1 capsule (1.25 mg total) by mouth daily.     Ticagrelor 90 MG Tabs tablet  Commonly known as:  BRILINTA  Take 1 tablet (90 mg total) by mouth 2 (two) times daily.           Follow-up Information   Follow up with Carrie Mew, MD. Schedule an appointment as soon as possible for a visit in 1 week.   Contact information:   [address] [place]ake [place] [address] [phone]       Follow up with Robynn Pane, MD. Schedule an appointment as soon as possible for a visit in 1 week.   Contact information:   [address] [place] [address] [phone]       Signed: Ricki Rodriguez 06/19/2012, 9:59 AM

## 2012-06-22 ENCOUNTER — Encounter: Payer: Self-pay | Admitting: Family

## 2012-06-22 ENCOUNTER — Ambulatory Visit (INDEPENDENT_AMBULATORY_CARE_PROVIDER_SITE_OTHER): Payer: Medicare Other | Admitting: Family

## 2012-06-22 VITALS — BP 100/60 | HR 72 | Wt 174.0 lb

## 2012-06-22 DIAGNOSIS — I229 Subsequent ST elevation (STEMI) myocardial infarction of unspecified site: Secondary | ICD-10-CM

## 2012-06-22 DIAGNOSIS — I219 Acute myocardial infarction, unspecified: Secondary | ICD-10-CM

## 2012-06-22 DIAGNOSIS — M545 Low back pain: Secondary | ICD-10-CM

## 2012-06-22 LAB — POCT URINALYSIS DIPSTICK
Blood, UA: NEGATIVE
Ketones, UA: NEGATIVE
Protein, UA: NEGATIVE
Spec Grav, UA: 1.02
pH, UA: 5.5

## 2012-06-22 NOTE — Progress Notes (Signed)
Subjective:    Patient ID: Randall [name], male    DOB: 1940-08-06, 72 y.o.   MRN: [id number]  HPI 72 year old white male, nonsmoker, patient of Dr. Lovell Sheehan is in status post STEMI represented to the emergency department on 06/16/2012 by EMS. Patient was found to have 100% LAD block and was immediately taken to the cath lab. Subsequently, he also has a 60% RAD block. But today, he feels significantly better than he has felt over the last week. He denies any chest pain or palpitations, no shortness of breath or edema. He is due to see cardiology today.  Has had intermittent right side pain over the last week that has resolved today. The pain comes and goes. Describes as mild and sharp. No history of any renal calculi in the past. Denies any blood in his urine, no foul-smelling urine.   Review of Systems  Constitutional: Negative.   HENT: Negative.   Eyes: Negative.   Respiratory: Negative.   Cardiovascular: Negative.   Gastrointestinal: Negative.   Endocrine: Negative.   Genitourinary: Negative.   Musculoskeletal: Negative.   Skin: Negative.   Allergic/Immunologic: Negative.   Neurological: Negative.   Hematological: Negative.   Psychiatric/Behavioral: Negative.    Past Medical History  Diagnosis Date  . Anxiety     History   Social History  . Marital Status: Divorced    Spouse Name: N/A    Number of Children: N/A  . Years of Education: N/A   Occupational History  . Not on file.   Social History Main Topics  . Smoking status: Former Smoker    Types: Pipe, Cigarettes    Quit date: 03/03/2005  . Smokeless tobacco: Not on file  . Alcohol Use: 7.0 oz/week    14 drink(s) per week  . Drug Use: No  . Sexually Active: Yes   Other Topics Concern  . Not on file   Social History Narrative  . No narrative on file    No past surgical history on file.  Family History  Problem Relation Age of Onset  . Cancer Mother     oropharengial  . Heart disease Father     No  Known Allergies  Current Outpatient Prescriptions on File Prior to Visit  Medication Sig Dispense Refill  . ALPRAZolam (XANAX) 0.5 MG tablet Take 0.5 mg by mouth 3 (three) times daily as needed for anxiety.      Marland Kitchen aspirin EC 81 MG tablet Take 81 mg by mouth daily.      Marland Kitchen atorvastatin (LIPITOR) 80 MG tablet Take 1 tablet (80 mg total) by mouth daily at 6 PM.  30 tablet  1  . carvedilol (COREG) 6.25 MG tablet Take 1 tablet (6.25 mg total) by mouth 2 (two) times daily with a meal.  60 tablet  1  . nitroGLYCERIN (NITROSTAT) 0.4 MG SL tablet Place 1 tablet (0.4 mg total) under the tongue every 5 (five) minutes x 3 doses as needed for chest pain.  25 tablet  0  . PROBIOTIC CAPS Take 1 capsule by mouth daily.      . ramipril (ALTACE) 1.25 MG capsule Take 1 capsule (1.25 mg total) by mouth daily.  30 capsule  1  . Ticagrelor (BRILINTA) 90 MG TABS tablet Take 1 tablet (90 mg total) by mouth 2 (two) times daily.  60 tablet  1   No current facility-administered medications on file prior to visit.    BP 100/60  Pulse 72  Wt 174 lb (78.926 kg)  BMI 26.07 kg/m2  SpO2 97%chart    Objective:   Physical Exam  Constitutional: He is oriented to person, place, and time. He appears well-developed and well-nourished.  HENT:  Head: Normocephalic.  Right Ear: External ear normal.  Left Ear: External ear normal.  Nose: Nose normal.  Mouth/Throat: Oropharynx is clear and moist.  Eyes: Conjunctivae and EOM are normal. Pupils are equal, round, and reactive to light.  Neck: Normal range of motion. Neck supple. No thyromegaly present.  Cardiovascular: Normal rate, regular rhythm and normal heart sounds.   Pulmonary/Chest: Effort normal and breath sounds normal.  Abdominal: Soft. Bowel sounds are normal.  Musculoskeletal: Normal range of motion.  Neurological: He is alert and oriented to person, place, and time. He has normal reflexes.  Skin: Skin is warm and dry.  Psychiatric: He has a normal mood and  affect.     To go to a UA: Within normal limits    Assessment & Plan:  Assessment:  1. Status post myocardial infarction 2. Anxiety 3. Intermittent right flank pain  Plan: See cardiology as scheduled. Begin cardiac rehabilitation. Blood sugars and starches in the diet to reduce risk factors. Continue current medications. Followup with Dr. Lovell Sheehan as scheduled and sooner as needed.

## 2012-06-22 NOTE — Patient Instructions (Addendum)
Myocardial Infarction  A myocardial infarction (MI) is damage to the heart that is not reversible. It is also called a heart attack. An MI usually occurs when a heart (coronary) artery becomes blocked or narrowed. This cuts off the blood supply to the heart. When one or more of the heart (coronary) arteries becomes blocked, that area of the heart begins to die. This causes pain felt during an MI.   If you think you might be having an MI, call your local emergency services immediately (911 in [place] It is recommended that you take a 162 mg non-enteric coated aspirin if you do not have an aspirin allergy. Do not drive yourself to the hospital or wait to see if your symptoms go away. The sooner MI is treated, the greater the amount of heart muscle saved. Time is muscle. It can save your life.  CAUSES   An MI can occur from:  · A gradual buildup of a fatty substance called plaque. When plaque builds up in the arteries, this condition is called atherosclerosis. This buildup can block or reduce the blood supply to the heart artery(s).  · A sudden plaque rupture within a heart artery that causes a blood clot (thrombus). A blood clot can block the heart artery which does not allow blood flow to the heart.  · A severe tightening (spasm) of the heart artery. This is a less common cause of a heart attack. When a heart artery spasms, it cuts off blood flow through the artery. Spasms can occur in heart arteries that do not have atherosclerosis.  RISK FACTORS  People at risk for an MI usually have one or more risk factors, such as:  · High blood pressure.  · High cholesterol.  · Smoking.  · Gender. Men have a higher heart attack risk.  · Overweight/obesity.  · Age.  · Family history.  · Lack of exercise.  · Diabetes.  · Stress.  · Excessive alcohol use.  · Street drug use (cocaine and methamphetamines).  SYMPTOMS   MI symptoms can vary, such as:  · In both men and women, MI symptoms can include the following:  · Chest pain. The  chest pain may feel like a crushing, squeezing, or "pressure" type feeling. MI pain can be "referred," meaning pain can be caused in one part of the body but felt in another part of the body. Referred MI pain may occur in the left arm, neck, or jaw. Pain may even be felt in the right arm.  · Shortness of breath (dyspnea).  · Heartburn or indigestion with or without vomiting, shortness of breath, or sweating (diaphoresis).  · Sudden, cold sweats.  · Sudden lightheadedness.  · Upper back pain.  · Women can have unique MI symptoms, such as:  · Unexplained feelings of nervousness or anxiety.  · Discomfort between the shoulder blades (scapula) or upper back.  · Tingling in the hands and arms.  · In elderly people (regardless of gender), MI symptoms can be subtle, such as:  · Sweating (diaphoresis).  · Shortness of breath (dyspnea).  · General tiredness (fatigue) or not feeling well (malaise).  DIAGNOSIS   Diagnosis of an MI involves several tests such as:  · An assessment of your vital signs such as heart rhythm, blood pressure, respiratory rate, and oxygen level.  · An EKG (ECG) to look at the electrical activity of your heart.  · Blood tests called cardiac markers are drawn at scheduled times to measure proteins or   enzymes released by the damaged heart muscle.  · A chest X-ray.  · An echocardiogram to evaluate heart motion and blood flow.  · Coronary angiography (cardiac catheterization). This is a diagnostic procedure to look at the heart arteries.  TREATMENT   Acute Intervention. For an MI, the national standard in the [place] is to have an acute intervention in under 90 minutes from the time you get to the hospital. An acute intervention is a special procedure to open up the heart arteries. It is done in a treatment room called a "catheterization lab" (cath lab). Some hospitals do no have a cath lab. If you are having an MI and the hospital does not have a cath lab, the standard is to transport you to a  hospital that has one. In the cath lab, acute intervention includes:  · Angioplasty. An angioplasty involves inserting a thin, flexible tube (catheter) into an artery in either your groin or wrist. The catheter is threaded to the heart arteries. A balloon at the end of the catheter is inflated to open a narrowed or blocked heart artery. During an angioplasty procedure, a small mesh tube (stent) may be used to keep the heart artery open. Depending on your condition and health history, one of two types of stents may be placed:  · Drug-eluting stent (DES). A DES is coated with a medicine to prevent scar tissue from growing over the stent. With drug-eluting stents, blood thinning medicine will need to be taken for up to a year.  · Bare metal stent. This type of stent has no special coating to keep tissue from growing over it. This type of stent is used if you cannot take blood thinning medicine for a prolonged time or you need surgery in the near future. After a bare metal stent is placed, blood thinning medicine will need to be taken for about a month.  · If you are taking blood thinning medicine (anti-platelet therapy) after stent placement, do not stop taking it unless your caregiver says it is okay to do so. Make sure you understand how long you need to take the medicine.  Surgical Intervention  · If an acute intervention is not successful, surgery may be needed:  · Open heart surgery (coronary artery bypass graft, CABG). CABG takes a vein (saphenous vein) from your leg. The vein is then attached to the blocked heart artery which bypasses the blockage. This then allows blood flow to the heart muscle.  Additional Interventions  · A "clot buster" medicine (thrombolytic) may be given. This medicine can help break up a clot in the heart artery. This medicine may be given if a person cannot get to a cath lab right away.  · Intra-aortic balloon pump (IABP). If you have suffered a very severe MI and are too unstable to go  to the cath lab or to surgery, an IABP may be used. This is a temporary mechanical device used to increase blood flow to the heart and reduce the workload of the heart until you are stable enough to go to the cath lab or surgery.  HOME CARE INSTRUCTIONS  After an MI, you may need the following:  · Medication. Take medication as directed by your caregiver. Medications after an MI may:  · Keep your blood from clotting easily (blood thinners).  · Control your blood pressure.  · Help lower your cholesterol.  · Control abnormal heart rhythms.  · Lifestyle changes. Under the guidance of your caregiver, lifestyle changes   include:  · Quitting smoking, if you smoke. Your caregiver can help you quit.  · Being physically active.  · Maintaining a healthy weight.  · Eating a heart healthy diet. A dietician can help you learn healthy eating options.  · Managing diabetes.  · Reducing stress.  · Limiting alcohol intake.  SEEK IMMEDIATE MEDICAL CARE IF:   · You have severe chest pain, especially if the pain is crushing or pressure-like and spreads to the arms, back, neck, or jaw. This is an emergency. Do not wait to see if the pain will go away. Get medical help at once. Call your local emergency services (911 in the [place] Do not drive yourself to the hospital.  · You have shortness of breath during rest, sleep, or with activity.  · You have sudden sweating or clammy skin.  · You feel sick to your stomach (nauseous) and throw up (vomit).  · You suddenly become lightheaded or dizzy.  · You feel your heart beating rapidly or you notice "skipped" beats.  MAKE SURE YOU:   · Understand these instructions.  · Will watch your condition.  · Will get help right away if you are not doing well or get worse.  Document Released: 02/17/2005 Document Revised: 05/12/2011 Document Reviewed: 07/23/2010  [employer]® Patient Information ©2013 [employer].

## 2012-06-29 ENCOUNTER — Ambulatory Visit (INDEPENDENT_AMBULATORY_CARE_PROVIDER_SITE_OTHER): Payer: 59 | Admitting: Professional

## 2012-06-29 DIAGNOSIS — F4323 Adjustment disorder with mixed anxiety and depressed mood: Secondary | ICD-10-CM

## 2012-06-30 ENCOUNTER — Encounter (HOSPITAL_COMMUNITY): Payer: Self-pay | Admitting: Emergency Medicine

## 2012-06-30 ENCOUNTER — Emergency Department (HOSPITAL_COMMUNITY): Payer: Medicare Other

## 2012-06-30 ENCOUNTER — Emergency Department (HOSPITAL_COMMUNITY)
Admission: EM | Admit: 2012-06-30 | Discharge: 2012-06-30 | Disposition: A | Discharge status: Home / Self Care | Payer: Medicare Other | Attending: Emergency Medicine | Admitting: Emergency Medicine

## 2012-06-30 DIAGNOSIS — R630 Anorexia: Secondary | ICD-10-CM | POA: Insufficient documentation

## 2012-06-30 DIAGNOSIS — C259 Malignant neoplasm of pancreas, unspecified: Secondary | ICD-10-CM | POA: Insufficient documentation

## 2012-06-30 DIAGNOSIS — C787 Secondary malignant neoplasm of liver and intrahepatic bile duct: Secondary | ICD-10-CM | POA: Insufficient documentation

## 2012-06-30 DIAGNOSIS — Z7982 Long term (current) use of aspirin: Secondary | ICD-10-CM | POA: Insufficient documentation

## 2012-06-30 DIAGNOSIS — I219 Acute myocardial infarction, unspecified: Secondary | ICD-10-CM | POA: Insufficient documentation

## 2012-06-30 DIAGNOSIS — F411 Generalized anxiety disorder: Secondary | ICD-10-CM | POA: Insufficient documentation

## 2012-06-30 DIAGNOSIS — Z87891 Personal history of nicotine dependence: Secondary | ICD-10-CM | POA: Insufficient documentation

## 2012-06-30 DIAGNOSIS — R109 Unspecified abdominal pain: Secondary | ICD-10-CM | POA: Insufficient documentation

## 2012-06-30 DIAGNOSIS — Z79899 Other long term (current) drug therapy: Secondary | ICD-10-CM | POA: Insufficient documentation

## 2012-06-30 LAB — COMPREHENSIVE METABOLIC PANEL
AST: 50 U/L — ABNORMAL HIGH (ref 0–37)
Albumin: 3.5 g/dL (ref 3.5–5.2)
Alkaline Phosphatase: 165 U/L — ABNORMAL HIGH (ref 39–117)
BUN: 12 mg/dL (ref 6–23)
Chloride: 102 mEq/L (ref 96–112)
Potassium: 5 mEq/L (ref 3.5–5.1)
Total Bilirubin: 0.6 mg/dL (ref 0.3–1.2)
Total Protein: 7.6 g/dL (ref 6.0–8.3)

## 2012-06-30 LAB — CBC
HCT: 35.4 % — ABNORMAL LOW (ref 39.0–52.0)
MCHC: 35.9 g/dL (ref 30.0–36.0)
Platelets: 260 10*3/uL (ref 150–400)
RDW: 12.9 % (ref 11.5–15.5)
WBC: 8.7 10*3/uL (ref 4.0–10.5)

## 2012-06-30 LAB — POCT I-STAT TROPONIN I

## 2012-06-30 LAB — LIPASE, BLOOD: Lipase: 184 U/L — ABNORMAL HIGH (ref 11–59)

## 2012-06-30 LAB — URINALYSIS, ROUTINE W REFLEX MICROSCOPIC
Bilirubin Urine: NEGATIVE
Glucose, UA: NEGATIVE mg/dL
Ketones, ur: NEGATIVE mg/dL
Leukocytes, UA: NEGATIVE
Nitrite: NEGATIVE
Specific Gravity, Urine: 1.008 (ref 1.005–1.030)
pH: 7 (ref 5.0–8.0)

## 2012-06-30 MED ORDER — MORPHINE SULFATE 4 MG/ML IJ SOLN
4.0000 mg | Freq: Once | INTRAMUSCULAR | Status: AC
  Administered 2012-06-30: 4 mg via INTRAVENOUS
  Filled 2012-06-30: qty 1

## 2012-06-30 MED ORDER — IOHEXOL 300 MG/ML  SOLN
100.0000 mL | Freq: Once | INTRAMUSCULAR | Status: AC | PRN
  Administered 2012-06-30: 100 mL via INTRAVENOUS

## 2012-06-30 MED ORDER — IOHEXOL 350 MG/ML SOLN
100.0000 mL | Freq: Once | INTRAVENOUS | Status: AC | PRN
  Administered 2012-06-30: 100 mL via INTRAVENOUS

## 2012-06-30 MED ORDER — HYDROCODONE-ACETAMINOPHEN 5-325 MG PO TABS: 1.0000 | ORAL_TABLET | Freq: Four times a day (QID) | ORAL | Status: DC | PRN

## 2012-06-30 MED ORDER — ONDANSETRON HCL 4 MG/2ML IJ SOLN: 4.0000 mg | Freq: Once | INTRAMUSCULAR | Status: DC

## 2012-06-30 NOTE — ED Notes (Signed)
Patient claims that he has been having gas pains for a few days.   Patient stated that he had a heart attack and was stented to the LAD 2 weeks ago today.   Patient claims that last night he had a sharp pain L chest without radiation that lasted only a few seconds, but it concerned him.  Patient states that the area is tender on palpation at this time.  Patient advised that he is completely resolved at this time.

## 2012-06-30 NOTE — ED Provider Notes (Addendum)
History     CSN: [id number]  Arrival date & time 06/30/12  8469   First MD Initiated Contact with Patient 06/30/12 1007      Chief Complaint  Patient presents with  . Chest Pain    Lt chest pain     (Consider location/radiation/quality/duration/timing/severity/associated sxs/prior treatment) HPI Comments: Patient states for the last one month when he would eat he would get some abdominal cramping for which he saw his doctor and was placed on probiotics and diet changes. However since coming home after his MI he has noticed worsening stomach cramps but around midnight last night developed a very sharp pain under his epigastric and left upper quadrant area  Patient is a 72 y.o. male presenting with abdominal pain. The history is provided by the patient.  Abdominal Pain Pain location:  Epigastric, LUQ and RUQ Pain quality: sharp and stabbing   Pain radiates to:  Does not radiate Pain severity:  Severe Onset quality:  Sudden Duration:  12 hours Timing:  Constant Progression:  Unchanged Chronicity:  New (states that over the last month he has had abd cramping after eating however much worse last night around midnight with severe stabbing pain) Context: alcohol use and eating   Context: not medication withdrawal and not sick contacts   Relieved by:  Nothing Worsened by:  Eating Ineffective treatments:  None tried Associated symptoms: anorexia   Associated symptoms: no chest pain, no cough, no diarrhea, no fever, no nausea, no shortness of breath and no vomiting   Risk factors: aspirin and recent hospitalization   Risk factors comment:  Recent stent placement for STEMI 2 weeks ago   Past Medical History  Diagnosis Date  . Anxiety     Past Surgical History  Procedure Laterality Date  . Cardiac surgery      MI with stent placement    Family History  Problem Relation Age of Onset  . Cancer Mother     oropharengial  . Heart disease Father     History  Substance Use  Topics  . Smoking status: Former Smoker    Types: Pipe, Cigarettes    Quit date: 03/03/2005  . Smokeless tobacco: Not on file  . Alcohol Use: 7.0 oz/week    14 drink(s) per week      Review of Systems  Constitutional: Negative for fever.  Respiratory: Negative for cough and shortness of breath.   Cardiovascular: Negative for chest pain.  Gastrointestinal: Positive for abdominal pain and anorexia. Negative for nausea, vomiting and diarrhea.  All other systems reviewed and are negative.    Allergies  Review of patient's allergies indicates no known allergies.  Home Medications   Current Outpatient Rx  Name  Route  Sig  Dispense  Refill  . ALPRAZolam (XANAX) 0.5 MG tablet   Oral   Take 0.5 mg by mouth 3 (three) times daily as needed for anxiety.         Marland Kitchen aspirin EC 81 MG tablet   Oral   Take 81 mg by mouth daily.         Marland Kitchen atorvastatin (LIPITOR) 80 MG tablet   Oral   Take 1 tablet (80 mg total) by mouth daily at 6 PM.   30 tablet   1   . carvedilol (COREG) 6.25 MG tablet   Oral   Take 1 tablet (6.25 mg total) by mouth 2 (two) times daily with a meal.   60 tablet   1   . nitroGLYCERIN (NITROSTAT)  0.4 MG SL tablet   Sublingual   Place 1 tablet (0.4 mg total) under the tongue every 5 (five) minutes x 3 doses as needed for chest pain.   25 tablet   0   . PROBIOTIC CAPS   Oral   Take 1 capsule by mouth daily.         . ramipril (ALTACE) 1.25 MG capsule   Oral   Take 1 capsule (1.25 mg total) by mouth daily.   30 capsule   1   . Ticagrelor (BRILINTA) 90 MG TABS tablet   Oral   Take 1 tablet (90 mg total) by mouth 2 (two) times daily.   60 tablet   1     BP 114/72  Pulse 70  Temp(Src) 98.2 F (36.8 C) (Oral)  Resp 14  Ht 5\' 8"  (1.727 m)  Wt 174 lb (78.926 kg)  BMI 26.46 kg/m2  SpO2 98%  Physical Exam  Nursing note and vitals reviewed. Constitutional: He is oriented to person, place, and time. He appears well-developed and well-nourished.  No distress.  HENT:  Head: Normocephalic and atraumatic.  Mouth/Throat: Oropharynx is clear and moist.  Eyes: Conjunctivae and EOM are normal. Pupils are equal, round, and reactive to light.  Neck: Normal range of motion. Neck supple.  Cardiovascular: Normal rate, regular rhythm and intact distal pulses.   No murmur heard. Pulmonary/Chest: Effort normal and breath sounds normal. No respiratory distress. He has no wheezes. He has no rales.  Abdominal: Soft. He exhibits no distension. There is tenderness in the right upper quadrant, epigastric area and left upper quadrant. There is guarding and positive Murphy's sign. There is no rebound and no CVA tenderness.  Musculoskeletal: Normal range of motion. He exhibits no edema and no tenderness.  Neurological: He is alert and oriented to person, place, and time.  Skin: Skin is warm and dry. No rash noted. No erythema.  Psychiatric: He has a normal mood and affect. His behavior is normal.    ED Course  Procedures (including critical care time)  Labs Reviewed  CBC - Abnormal; Notable for the following:    Hemoglobin 12.7 (*)    HCT 35.4 (*)    All other components within normal limits  COMPREHENSIVE METABOLIC PANEL - Abnormal; Notable for the following:    Glucose, Bld 114 (*)    AST 50 (*)    Alkaline Phosphatase 165 (*)    GFR calc non Af Amer 84 (*)    All other components within normal limits  LIPASE, BLOOD - Abnormal; Notable for the following:    Lipase 184 (*)    All other components within normal limits  URINALYSIS, ROUTINE W REFLEX MICROSCOPIC  POCT I-STAT TROPONIN I   Dg Chest 2 View  06/30/2012  *RADIOLOGY REPORT*  Clinical Data: Chest pain.  CHEST - 2 VIEW  Comparison: 06/16/2012  Findings: Heart is normal size.  Tortuosity of the thoracic aorta. Mediastinal contours otherwise within normal limits.  No focal airspace opacity, effusion or pneumothorax.  Degenerative changes in the thoracic spine.  IMPRESSION: No acute  cardiopulmonary disease.   Original Report Authenticated By: Charlett Nose, M.D.    Ct Angio Abdomen W/cm &/or Wo Contrast  06/30/2012  *RADIOLOGY REPORT*  Clinical Data:  Recent myocardial infarction post coronary stenting.  Abdominal pain.  Multiple liver lesions on recent ultrasound.  CT ANGIOGRAPHY ABDOMEN  Technique:  Multidetector CT imaging of the abdomen a  was performed using the standard protocol during bolus administration  of intravenous contrast.  Multiplanar reconstructed images including MIPs were obtained and reviewed to evaluate the vascular anatomy.  Contrast: OMNIPAQUE IOHEXOL 300 MG/ML  SOLN  Comparison:  Ultrasound from earlier the same day  Arterial findings: Aorta:                  Ectatic and atheromatous, measuring 3.9 cm diameter at the level of the SMA, 4.2 cm in its immediate infrarenal segment, ectatic distally.  The bifurcation was not included.  No dissection or stenosis.  Celiac axis:            Patent with unremarkable distal branching, mild plaque in the splenic artery.  Superior mesenteric:Widely patent without   encasement by the pancreatic lesion.  Left renal:             Single, widely patent.  Right renal:            Single, with scattered calcified plaque without high-grade stenosis.  Inferior mesenteric:Patent in its proximal visualized segment.  Venous findings:  Patent portal hepatic veins, portal vein, splenic vein, bilateral renal veins, and IVC.  There is nonocclusive thrombus in the superior mesenteric vein just inferior to its confluence with the splenic and portal veins, without definite encasement by the pancreatic mass.   Review of the MIP images confirms the above findings.  Nonvascular findings: Multiple liver lesions showing peripheral enhancement and central low attenuation, largest in the lateral left hepatic segment measuring at least 6.1 cm transverse diameter. There is no intrahepatic biliary ductal dilatation.  There is a 6 cm mass in the pancreatic  uncinate process with some adjacent central mesenteric adenopathy as well as periportal and portacaval adenopathy.  There is no dilatation of the main pancreatic duct.  Unremarkable adrenal glands, kidneys, spleen.  Stomach, and visualized portions of small bowel and colon are nondilated. Scattered descending colon diverticula noted.  No ascites.  No free air. Spondylitic changes in the visualized thoracolumbar spine.  IMPRESSION:  1.  6 cm pancreatic mass with regional adenopathy and  multiple liver lesions suggesting metastatic pancreatic carcinoma. Liver lesions approachable for percutaneous biopsy if clinically relevant. 2.  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 3. 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  This recommendation follows ACR consensus guidelines: White Paper of the ACR Incidental Findings Committee II on Vascular Findings.  J Am Coll Radiol 2013; 10:789-794.   Original Report Authenticated By: D. Andria Rhein, MD    US Abdomen Complete  06/30/2012  *RADIOLOGY REPORT*  Clinical Data:  Abdominal pain.  COMPLETE ABDOMINAL ULTRASOUND  Comparison:  None.  Findings:  Gallbladder:  No gallstones, gallbladder wall thickening, or pericholecystic fluid.  Common bile duct:  6.0 mm  Liver:  Multiple liver lesions measuring up to 5.7 cm.  IVC:  Poorly delineated.  Pancreas:  Poorly delineated with suggestion of 1.8 x 1.2 x 3.6 cm hypoechoic lesion pancreatic body - tail region.  Spleen:  5.9 cm without focal mass.  Right Kidney:  11.6 cm. No hydronephrosis or renal mass.  Renal parenchymal thinning.  Left Kidney:  12.1 cm. No hydronephrosis or renal mass.  Renal parenchymal thinning.  Abdominal aorta:  Atherosclerotic type changes.  Evaluation limited by bowel gas.  Suggestion of 4.2 cm aneurysm.  IMPRESSION: Multiple liver lesions.  Possibly of metastatic disease is raised.  Question pancreatic mass measuring up  to 3.6 cm.  4.2 cm abdominal aortic aneurysm may  be present.  These findings can be evaluated with contrast enhanced CT scan with attention to the pancreas.  This has been made a PRA call report utilizing dashboard call feature.   Original Report Authenticated By: Lacy Duverney, M.D.      Date: 06/30/2012  Rate: 72  Rhythm: normal sinus rhythm  QRS Axis: normal  Intervals: normal  ST/T Wave abnormalities: nonspecific ST changes and pronounced T wave inversion in anteroseptal leads  Conduction Disutrbances:right bundle branch block  Narrative Interpretation:   Old EKG Reviewed: unchanged   1. Pancreatic cancer metastasized to liver       MDM   Patient with history of steady 2 weeks ago requiring stents who presents today do to sharp upper abdominal pain. He states for the last month he's had stomach issues however last night the pain became much worse. On exam he has right upper quadrant, left upper quadrant and epigastric tenderness.  He denies fever, nausea, shortness of breath. He states this pain has been going on before his heart attack and states that this pain feels nothing like his heart attack.  EKG is unchanged with persistent T wave inversion in the anterior septal leads.  Low suspicion for cardiac etiology at this time and feel most likely cholecystitis versus pancreatitis versus peptic ulcer disease. Patient currently does not take any antacids or H2 blockers. He was a smoker and drinks daily. He denies any change in his stool or concerns for GI bleeding.  CBC within normal limits with stable hemoglobin. CMP, lipase and UA pending. Chest x-ray within normal limits. Abdominal ultrasound pending and patient given pain control   2:53 PM Ultrasound showed a multiple liver lesions and possible pancreatic mass. CT confirmed a 6 cm pancreatic lesion with liver metastases. Patient is tolerating by mouth is with no vomiting and pain was controlled after 4 mg of morphine. Left a message  on patient's PCPs phone Dr. Darryll Capers. Patient will followup with Dr. Lovell Sheehan in the morning for oncology referral and further testing.   Gwyneth Sprout, MD 06/30/12 1454  Gwyneth Sprout, MD 06/30/12 1454

## 2012-06-30 NOTE — ED Notes (Signed)
Patient ambulated to restroom and tolerated well.  

## 2012-07-01 ENCOUNTER — Ambulatory Visit (INDEPENDENT_AMBULATORY_CARE_PROVIDER_SITE_OTHER): Payer: Medicare Other | Admitting: Internal Medicine

## 2012-07-01 ENCOUNTER — Encounter: Payer: Self-pay | Admitting: Internal Medicine

## 2012-07-01 VITALS — BP 110/60 | HR 72 | Temp 98.3°F | Resp 16 | Ht 68.0 in | Wt 174.0 lb

## 2012-07-01 DIAGNOSIS — C801 Malignant (primary) neoplasm, unspecified: Secondary | ICD-10-CM

## 2012-07-01 DIAGNOSIS — C259 Malignant neoplasm of pancreas, unspecified: Secondary | ICD-10-CM

## 2012-07-01 DIAGNOSIS — I2510 Atherosclerotic heart disease of native coronary artery without angina pectoris: Secondary | ICD-10-CM

## 2012-07-01 DIAGNOSIS — C787 Secondary malignant neoplasm of liver and intrahepatic bile duct: Secondary | ICD-10-CM

## 2012-07-01 NOTE — Patient Instructions (Signed)
Will set up OV with Cardiology and oncology as well as GI

## 2012-07-01 NOTE — Progress Notes (Signed)
  Subjective:    Patient ID: Randall [name], male    DOB: Jul 15, 1940, 72 y.o.   MRN: [id number]  HPI Patient is a 72 year old male with a history of elevated blood pressure without the diagnosis of hypertension who was recently evaluated by cardiology with cardiac catheterization and had a blocked coronary artery and was treated for cardiovascular disease with with stent placement in the LAD. He has undergone successful cardiovascular workup but had an episode of recurrent chest abdominal pain and since his initial presentation was one of abdominal pain he went to the emergency room for evaluation complete evaluation emergency room revealed no active cardiovascular injury by CT scan of the abdomen revealed pancreatic mass and an elevated lipase with gradually worsening liver functions. There are possible mets to the liver. The mass is in the uncinate process.  Needs oncology referral and GI for tissue biopsy.    Review of Systems  Constitutional: Negative.   Eyes: Negative.   Respiratory: Positive for chest tightness.   Gastrointestinal: Positive for abdominal pain and abdominal distention.  Genitourinary: Negative.   Psychiatric/Behavioral: Positive for sleep disturbance and agitation. The patient is nervous/anxious.        Objective:   Physical Exam  Nursing note and vitals reviewed. Constitutional: He is oriented to person, place, and time. He appears well-developed and well-nourished.  Eyes: Conjunctivae are normal. Pupils are equal, round, and reactive to light.  Cardiovascular: Normal rate and regular rhythm.   Pulmonary/Chest: Effort normal and breath sounds normal.  Abdominal: Soft. Bowel sounds are normal.  Neurological: He is alert and oriented to person, place, and time.  Psychiatric: He has a normal mood and affect. His behavior is normal.          Assessment & Plan:  Referral to GI and oncology ASAP Stable AA Stable BB Stable CAD on Lipitor 80   Stress test  prior to Chemotherapy or surgery Follow up with Cardiology... Seeing Dr Sharyn Lull. Possible referral to [facility].

## 2012-07-02 ENCOUNTER — Telehealth: Payer: Self-pay | Admitting: *Deleted

## 2012-07-02 ENCOUNTER — Telehealth: Payer: Self-pay | Admitting: Gastroenterology

## 2012-07-02 ENCOUNTER — Telehealth: Payer: Self-pay | Admitting: Internal Medicine

## 2012-07-02 NOTE — Telephone Encounter (Signed)
Daughter states that the pt's Urgent referral for Dr Antoine Poche was not marked urgent, so cardiology scheduled him 6/7. She states this is not goig to work, that it needs to be [facility] sooner. She talked w/Cardiology, but they said the push had to come from Dr Lovell Sheehan. Please advise. You can call Dr Hochrein's office please and move up.  Please communicate any necessary appt info to pt. Daughter states if he cannot get in ASAP w/Hochrein, then she'll have dad stick with current cardiologist.

## 2012-07-02 NOTE — Telephone Encounter (Signed)
Spoke with patient by phone to inform him that his referral was received.  Dr. Truett Perna aware of referral and appointment with Dr. Arlyce Dice on 07/07/12.  Contact names and numbers were provided to patient.  Patient will be contacted with oncology appointment soon.  Patient was without questions.

## 2012-07-02 NOTE — Telephone Encounter (Signed)
Randall [name] [phone] the pt's daughter wants to know if Dr. Arlyce Dice has seen the CT results. She says all the family is coming out of state and she would like to have an idea of what may go on next week, so the other family members can start buying plane tickets.

## 2012-07-02 NOTE — Telephone Encounter (Signed)
Pt scheduled to see Dr. Arlyce Dice 07/07/12@9 :15am. Pt aware of appt date and time.

## 2012-07-02 NOTE — Telephone Encounter (Signed)
Appointment with dr Excell Seltzer is scheduled for 07-07-2012 at 4pm-pt informed

## 2012-07-05 NOTE — Telephone Encounter (Signed)
Spoke with Leotis Shames and told her Dr. Arlyce Dice will examine her father and then will discuss plans. He may need more labs and will probably need a procedure to obtain pathology. She will be coming with her dad for appointment. Encouraged her to write down questions and to bring them to OV.

## 2012-07-06 ENCOUNTER — Telehealth: Payer: Self-pay | Admitting: *Deleted

## 2012-07-06 NOTE — Telephone Encounter (Signed)
Spoke with patient and confirmed appointment with Dr. Truett Perna for 07/15/12.  Contact names and phone numbers were provided.

## 2012-07-07 ENCOUNTER — Ambulatory Visit (INDEPENDENT_AMBULATORY_CARE_PROVIDER_SITE_OTHER): Payer: Medicare Other | Admitting: Cardiovascular Disease

## 2012-07-07 ENCOUNTER — Encounter: Payer: Self-pay | Admitting: Gastroenterology

## 2012-07-07 ENCOUNTER — Telehealth: Payer: Self-pay | Admitting: *Deleted

## 2012-07-07 ENCOUNTER — Other Ambulatory Visit: Payer: Medicare Other

## 2012-07-07 ENCOUNTER — Encounter: Payer: Self-pay | Admitting: Cardiovascular Disease

## 2012-07-07 ENCOUNTER — Ambulatory Visit (INDEPENDENT_AMBULATORY_CARE_PROVIDER_SITE_OTHER): Payer: Medicare Other | Admitting: Gastroenterology

## 2012-07-07 VITALS — BP 100/60 | HR 80 | Ht 66.0 in | Wt 165.2 lb

## 2012-07-07 VITALS — BP 110/60 | HR 69 | Ht 66.0 in | Wt 166.0 lb

## 2012-07-07 DIAGNOSIS — C229 Malignant neoplasm of liver, not specified as primary or secondary: Secondary | ICD-10-CM

## 2012-07-07 DIAGNOSIS — K8689 Other specified diseases of pancreas: Secondary | ICD-10-CM

## 2012-07-07 DIAGNOSIS — I2129 ST elevation (STEMI) myocardial infarction involving other sites: Secondary | ICD-10-CM | POA: Insufficient documentation

## 2012-07-07 DIAGNOSIS — K869 Disease of pancreas, unspecified: Secondary | ICD-10-CM

## 2012-07-07 DIAGNOSIS — I2109 ST elevation (STEMI) myocardial infarction involving other coronary artery of anterior wall: Secondary | ICD-10-CM

## 2012-07-07 NOTE — Patient Instructions (Signed)
Your physician recommends that you keep your scheduled follow-up appointment with Dr Sharyn Lull.

## 2012-07-07 NOTE — Telephone Encounter (Signed)
Spoke with patient by phone and confirmed appointment with Dr. Truett Perna for 07/09/12.

## 2012-07-07 NOTE — Progress Notes (Signed)
HPI:   72 year-old gentleman presenting for cardiac evaluation. His situation is fairly complicated. He presented April 16th with an acute anterior MI and was treated with primary PCI of the LAD with a DES platform. His LAD was totally occluded in it's proximal aspect. He was noted to have moderately severe stenosis of the mid-RCA as well. He had moderate LV dysfunction with anteroapical akinesis and an estimated LVEF of 45% at the time of his infarct. Prior to his acute MI, he had been healthy and never had surgery or hospitalizations.   He then developed abdominal pain and cramping which failed conservative treatment. A CT scan of the abdomen demonstrated a 6 cm pancreatic mass with adenopathy and multiple liver lesions suggestive of metastatic pancreatic carcinoma.  He has been followed by Dr Sharyn Lull who took care of him during his MI. From a cardiac perspective he is making good progress. He walks 20 minutes daily without exertional chest pain or pressure. He has mild dyspnea and fatigue. No orthopnea, PND, or leg swelling. No claudication symptoms. He continues to have constant abdominal pain worse after eating. No other complaints.  Outpatient Encounter Prescriptions as of 07/07/2012  Medication Sig Dispense Refill  . acetaminophen (TYLENOL) 500 MG tablet Take 500-1,000 mg by mouth daily as needed for pain.      Marland Kitchen ALPRAZolam (XANAX) 0.5 MG tablet Take 0.5 mg by mouth 3 (three) times daily as needed for anxiety.      Marland Kitchen aspirin EC 81 MG tablet Take 81 mg by mouth daily.      Marland Kitchen atorvastatin (LIPITOR) 80 MG tablet Take 1 tablet (80 mg total) by mouth daily at 6 PM.  30 tablet  1  . carvedilol (COREG) 6.25 MG tablet Take 1 tablet (6.25 mg total) by mouth 2 (two) times daily with a meal.  60 tablet  1  . docusate sodium (COLACE) 100 MG capsule Take 100 mg by mouth as needed for constipation.      Marland Kitchen HYDROcodone-acetaminophen (NORCO/VICODIN) 5-325 MG per tablet Take 1 tablet by mouth every 6 (six)  hours as needed for pain.  20 tablet  0  . nitroGLYCERIN (NITROSTAT) 0.4 MG SL tablet Place 1 tablet (0.4 mg total) under the tongue every 5 (five) minutes x 3 doses as needed for chest pain.  25 tablet  0  . ramipril (ALTACE) 1.25 MG capsule Take 1 capsule (1.25 mg total) by mouth daily.  30 capsule  1  . Ticagrelor (BRILINTA) 90 MG TABS tablet Take 1 tablet (90 mg total) by mouth 2 (two) times daily.  60 tablet  1   No facility-administered encounter medications on file as of 07/07/2012.    Review of patient's allergies indicates no known allergies.  Past Medical History  Diagnosis Date  . Anxiety   . Pancreatic cancer metastasized to liver   . MI (myocardial infarction)   . HTN (hypertension)   . Right rotator cuff tear   . Diverticulosis     Past Surgical History  Procedure Laterality Date  . Cardiac surgery      MI with stent placement    History   Social History  . Marital Status: Divorced    Spouse Name: N/A    Number of Children: 2  . Years of Education: N/A   Occupational History  . retired    Social History Main Topics  . Smoking status: Current Some Day Smoker    Types: Cigarettes, Cigars    Last Attempt to  Quit: 03/03/2005  . Smokeless tobacco: Not on file     Comment: pt quit cigarettes in 2007 but still smoke cigars  . Alcohol Use: No     Comment: quit 06/16/2012  . Drug Use: No  . Sexually Active: Yes   Other Topics Concern  . Not on file   Social History Narrative  . No narrative on file    Family History  Problem Relation Age of Onset  . Cancer Mother     oropharengial  . Heart attack Father   . Ovarian cancer Sister   . Anuerysm Brother     AAA    ROS:  General: no fevers/chills/night sweats Eyes: no blurry vision, diplopia, or amaurosis ENT: no sore throat or hearing loss Resp: no cough, wheezing, or hemoptysis CV: no edema or palpitations GI: see HPI GU: no dysuria, frequency, or hematuria Skin: no rash Neuro: no headache,  numbness, tingling, or weakness of extremities Musculoskeletal: no joint pain or swelling Heme: no bleeding, DVT, or easy bruising Endo: no polydipsia or polyuria  BP 110/60  Pulse 69  Ht 5\' 6"  (1.676 m)  Wt 75.297 kg (166 lb)  BMI 26.81 kg/m2  SpO2 98%  PHYSICAL EXAM: Pt is alert and oriented, WD, WN, in no distress. HEENT: normal Neck: JVP normal. Carotid upstrokes normal without bruits. No thyromegaly. Lungs: equal expansion, clear bilaterally CV: Apex is discrete and nondisplaced, RRR without murmur or gallop Abd: soft, +BS, tenderness in RUQ without rebound or guarding, no masses Back: no CVA tenderness Ext: no C/C/E        Femoral pulses 2+= without bruits        DP/PT pulses intact and = Skin: warm and dry without rash Neuro: CNII-XII intact             Strength intact = bilaterally  CT Abdomen 4/30: IMPRESSION:  1. 6 cm pancreatic mass with regional adenopathy and multiple  liver lesions suggesting metastatic pancreatic carcinoma. Liver  lesions approachable for percutaneous biopsy if clinically  relevant.  2. 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  3. 4.2 cm aneurysmal abdominal aorta.Recommend followup by [place] in 1  Year.  2D Echo 4/17: Study Conclusions  - Left ventricle: The cavity size was normal. Systolic function was mildly to moderately reduced. The estimated ejection fraction was in the range of 40% to 45%. There is severe hypokinesis of the distalanteroseptal and apical myocardium. Left ventricular diastolic function parameters were normal. - Mitral valve: Mild regurgitation. - Atrial septum: No defect or patent foramen ovale was identified.  ASSESSMENT AND PLAN: 1. CAD with recent anterior wall MI 2. Ischemic cardiomyopathy without evidence of clinical CHF, LVEF 45% 3. Pancreatic cancer with mets, suspected 4. HTN 5. Hyperlipidemia 6. 4.2 cm AAA  Cath images and echo images  personally reviewed. Difficult situation in patient with recent MI, drug-eluting stent in proximal LAD who needs to interrupt antiplatelet Rx for biopsy. I have reviewed options with the patient and his daughter in detail and here are options as I see them:   Stop brilinta and admit to hospital within 48 hours for integrilin bridge for biopsy  Wait for 3 months from time of stent implantation to do biopsy  Treat presumptively for pancreatic CA without tissue diagnosis  Waiting doesn't seem like a reasonable option, so probably best to bridge him or treat presumptively. I will forward my note to Dr Truett Perna and Dr Arlyce Dice so this decision  can be made. The patient is quite happy with Dr Sharyn Lull as his cardiologist and he can assist further with periprocedural management of his antiplatelet drugs.  With respect to his residual CAD, it is clear that he should be treated medically in the setting of probable pancreatic CA. He is doing well with his walking program and has no anginal symptoms.  I would be happy to help with his care in the future if needed, but will defer to Dr Annitta Jersey management.  In excess of 60 minutes was spent in conducting this consultation including review of hospital and office records, cath and echo studies, and other radiographic studies. Greater than 50% of that time was spent in face-to-face discussion with the patient and his daughter.  Tonny Bollman 07/07/2012 6:30 PM

## 2012-07-07 NOTE — Patient Instructions (Addendum)
You have been given a separate informational sheet regarding your tobacco use, the importance of quitting and local resources to help you quit. We will contact you when this appointment becomes reviewed at set.

## 2012-07-07 NOTE — Progress Notes (Signed)
History of Present Illness: 72 year old white male deferred at the request of Dr. Lovell Sheehan for evaluation of an abnormal CT. Approximately 2 weeks ago he had an acute MI for which he underwent stent placement and was placed on brilinta.  He's been complaining of upper abdominal pain for 2-3 months. He was seen in the ER because of pain where an ultrasound and and CT demonstrated a pancreatic mass in the area the uncinate process with multiple liver metastases. There is a non-occlusive thrombosis of the SMV. There is also regional lymphadenopathy. A 4.2 cm abdominal aortic aneurysm was seen.  The patient has lost about 9 pounds. He is reluctant to eat because of pain.  He denies pruritus.     Past Medical History  Diagnosis Date  . Anxiety   . Pancreatic cancer metastasized to liver   . MI (myocardial infarction)   . HTN (hypertension)   . Right rotator cuff tear   . Diverticulosis    Past Surgical History  Procedure Laterality Date  . Cardiac surgery      MI with stent placement   family history includes Anuerysm in his brother; Cancer in his mother; Heart attack in his father; and Ovarian cancer in his sister. Current Outpatient Prescriptions  Medication Sig Dispense Refill  . acetaminophen (TYLENOL) 500 MG tablet Take 500-1,000 mg by mouth daily as needed for pain.      Marland Kitchen ALPRAZolam (XANAX) 0.5 MG tablet Take 0.5 mg by mouth 3 (three) times daily as needed for anxiety.      Marland Kitchen aspirin EC 81 MG tablet Take 81 mg by mouth daily.      Marland Kitchen atorvastatin (LIPITOR) 80 MG tablet Take 1 tablet (80 mg total) by mouth daily at 6 PM.  30 tablet  1  . carvedilol (COREG) 6.25 MG tablet Take 1 tablet (6.25 mg total) by mouth 2 (two) times daily with a meal.  60 tablet  1  . docusate sodium (COLACE) 100 MG capsule Take 100 mg by mouth as needed for constipation.      Marland Kitchen HYDROcodone-acetaminophen (NORCO/VICODIN) 5-325 MG per tablet Take 1 tablet by mouth every 6 (six) hours as needed for pain.  20 tablet  0   . nitroGLYCERIN (NITROSTAT) 0.4 MG SL tablet Place 1 tablet (0.4 mg total) under the tongue every 5 (five) minutes x 3 doses as needed for chest pain.  25 tablet  0  . ramipril (ALTACE) 1.25 MG capsule Take 1 capsule (1.25 mg total) by mouth daily.  30 capsule  1  . Ticagrelor (BRILINTA) 90 MG TABS tablet Take 1 tablet (90 mg total) by mouth 2 (two) times daily.  60 tablet  1   No current facility-administered medications for this visit.   Allergies as of 07/07/2012  . (No Known Allergies)    reports that he has been smoking Cigarettes and Cigars.  He has been smoking about 0.00 packs per day. He does not have any smokeless tobacco history on file. He reports that he does not drink alcohol or use illicit drugs.     Review of Systems: Pertinent positive and negative review of systems were noted in the above HPI section. All other review of systems were otherwise negative.  Vital signs were reviewed in today's medical record Physical Exam: General: Well developed , well nourished, no acute distress Skin: anicteric Head: Normocephalic and atraumatic Eyes:  sclerae anicteric, EOMI Ears: Normal auditory acuity Mouth: No deformity or lesions Neck: Supple, no masses or thyromegaly Lungs:  Clear throughout to auscultation Heart: Regular rate and rhythm; no murmurs, rubs or bruits Abdomen: Soft.  There is marked tenderness in the right upper quadrant which probably corresponds to enlarged liver which I believe is palpable 3 fingerbreadths below the right costal margin Rectal:deferred Musculoskeletal: Symmetrical with no gross deformities  Skin: No lesions on visible extremities Pulses:  Normal pulses noted Extremities: No clubbing, cyanosis, edema or deformities noted Neurological: Alert oriented x 4, grossly nonfocal Cervical Nodes:  No significant cervical adenopathy Inguinal Nodes: No significant inguinal adenopathy Psychological:  Alert and cooperative. Normal mood and  affect

## 2012-07-07 NOTE — Assessment & Plan Note (Addendum)
CT clearly demonstrates a pancreas mass with lymphadenopathy and multiple liver metastases. Abdominal pain is likely related to his enlarged liver. By CT criteria he is unresectable. In addition, patient has recently had an acute MI and is on brilinta.  There is no evidence for bile duct obstruction at this point.  Recommendations #1 CT-guided percutaneous liver biopsy. His antiplatelet medication will have to be held. I will speak with cardiology (Dr. Excell Seltzer) when this is permissible #2 check CEA, CA 19 [phone] #3 oncology referral

## 2012-07-07 NOTE — Assessment & Plan Note (Signed)
Per cardiology 

## 2012-07-08 ENCOUNTER — Telehealth: Payer: Self-pay | Admitting: *Deleted

## 2012-07-08 ENCOUNTER — Telehealth: Payer: Self-pay | Admitting: Gastroenterology

## 2012-07-08 LAB — CEA: CEA: 24.7 ng/mL — ABNORMAL HIGH (ref 0.0–5.0)

## 2012-07-08 LAB — CANCER ANTIGEN 19-9: CA 19-9: 40775.9 U/mL — ABNORMAL HIGH (ref <35.0)

## 2012-07-08 NOTE — Telephone Encounter (Signed)
Pt calling for lab results. Please advise. 

## 2012-07-08 NOTE — Telephone Encounter (Signed)
Tumor markers are elevated as I would have expected.  I have contacted Dr. Sharyn Lull regarding arrangements for liver biopsy. I expect to hear from him within 24 hours.

## 2012-07-08 NOTE — Telephone Encounter (Signed)
Referral made to dietician 

## 2012-07-08 NOTE — Telephone Encounter (Signed)
Pt aware.

## 2012-07-09 ENCOUNTER — Other Ambulatory Visit: Payer: Self-pay | Admitting: *Deleted

## 2012-07-09 ENCOUNTER — Ambulatory Visit (HOSPITAL_BASED_OUTPATIENT_CLINIC_OR_DEPARTMENT_OTHER): Payer: Medicare Other

## 2012-07-09 ENCOUNTER — Encounter: Payer: Self-pay | Admitting: Oncology

## 2012-07-09 ENCOUNTER — Telehealth: Payer: Self-pay | Admitting: Oncology

## 2012-07-09 ENCOUNTER — Ambulatory Visit (HOSPITAL_BASED_OUTPATIENT_CLINIC_OR_DEPARTMENT_OTHER): Payer: Medicare Other | Admitting: Oncology

## 2012-07-09 VITALS — BP 112/63 | HR 67 | Temp 98.0°F | Resp 17 | Ht 67.0 in | Wt 166.0 lb

## 2012-07-09 DIAGNOSIS — I82290 Acute embolism and thrombosis of other thoracic veins: Secondary | ICD-10-CM

## 2012-07-09 DIAGNOSIS — K869 Disease of pancreas, unspecified: Secondary | ICD-10-CM

## 2012-07-09 DIAGNOSIS — K7689 Other specified diseases of liver: Secondary | ICD-10-CM

## 2012-07-09 DIAGNOSIS — G893 Neoplasm related pain (acute) (chronic): Secondary | ICD-10-CM

## 2012-07-09 DIAGNOSIS — C259 Malignant neoplasm of pancreas, unspecified: Secondary | ICD-10-CM

## 2012-07-09 MED ORDER — HYDROCODONE-ACETAMINOPHEN 5-325 MG PO TABS: 1.0000 | ORAL_TABLET | Freq: Four times a day (QID) | ORAL | Status: DC | PRN

## 2012-07-09 NOTE — Progress Notes (Signed)
Checked in new pt with no financial concerns. °

## 2012-07-09 NOTE — Telephone Encounter (Signed)
gv and printd appt sched and avs for pt for may and June...MB added tx.Marland KitchenMarland KitchenMarland KitchenPer Dr. Truett Perna he will contact [facility] about IR  fluro at [facility] due to being done while the pt will be inpatient.Marland KitchenMarland Kitchen

## 2012-07-09 NOTE — Progress Notes (Signed)
[facility] New Patient Consult   Referring MD: Leonardo Makris 72 y.o.  13-Oct-1940    Reason for Referral: Pancreas cancer     HPI: He reports abdomen and mid back pain for approximately the past 2 months. He developed acute chest pain on 06/16/2012 and was diagnosed with an acute myocardial infarction. He underwent PTCA/stent placement to the LAD. He was discharged on 06/19/2012.  After discharge from the hospital the abdominal pain persisted. He returned the emergency room on 06/30/2012 with epigastric and left upper quadrant pain. An abdominal ultrasound revealed multiple liver lesions. A pancreas mass was question. He was referred for a CT on the same day. This confirmed ALT the liver lesions. No intrahepatic biliary ductal dilatation. A 6 center mass was noted in the uncinate process with adjacent mesenteric adenopathy and periportal/portacaval lymphadenopathy. No ascites. 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  He saw Dr. Arlyce Dice on 07/07/2012. Dr. Arlyce Dice recommends a liver biopsy.  He continues to have pain in the mid abdomen and back. The pain is relieved with hydrocodone.  Past Medical History  Diagnosis Date  . Anxiety   . Pancreatic cancer metastasized to liver-CT   06/30/2012   . MI (myocardial infarction)-status post PTCA/stent   06/16/2012   . HTN (hypertension)   . Right rotator cuff tear   . Diverticulosis     Past Surgical History  Procedure Laterality Date  . Cardiac surgery   06/16/2012     MI with drug-eluting stent placement    Family History  Problem Relation Age of Onset  . Cancer Mother     oropharengial  . Heart attack Father   . Ovarian cancer Sister   . Anuerysm Brother     AAA   .   Breast cancer                                    maternal grandmother  .   Ovarian cancer                                  maternal aunt  Current outpatient  prescriptions:acetaminophen (TYLENOL) 500 MG tablet, Take 500-1,000 mg by mouth daily as needed for pain., Disp: , Rfl: ;  ALPRAZolam (XANAX) 0.5 MG tablet, Take 0.5 mg by mouth 3 (three) times daily as needed for anxiety., Disp: , Rfl: ;  aspirin EC 81 MG tablet, Take 81 mg by mouth daily., Disp: , Rfl: ;  atorvastatin (LIPITOR) 80 MG tablet, Take 1 tablet (80 mg total) by mouth daily at 6 PM., Disp: 30 tablet, Rfl: 1 carvedilol (COREG) 6.25 MG tablet, Take 1 tablet (6.25 mg total) by mouth 2 (two) times daily with a meal., Disp: 60 tablet, Rfl: 1;  docusate sodium (COLACE) 100 MG capsule, Take 100 mg by mouth as needed for constipation., Disp: , Rfl: ;  nitroGLYCERIN (NITROSTAT) 0.4 MG SL tablet, Place 1 tablet (0.4 mg total) under the tongue every 5 (five) minutes x 3 doses as needed for chest pain., Disp: 25 tablet, Rfl: 0 ramipril (ALTACE) 1.25 MG capsule, Take 1 capsule (1.25 mg total) by mouth daily., Disp: 30 capsule, Rfl: 1;  Ticagrelor (BRILINTA) 90 MG TABS tablet, Take 1 tablet (90 mg total) by mouth 2 (two) times daily., Disp:  60 tablet, Rfl: 1;  HYDROcodone-acetaminophen (NORCO/VICODIN) 5-325 MG per tablet, Take 1 tablet by mouth every 6 (six) hours as needed for pain., Disp: 50 tablet, Rfl: 2  Allergies: No Known Allergies  Social History: He lives in [place]. He is retired from [employer]. He quit smoking cigarettes in 2005. He drinks alcohol, a few drinks per day. He was in the [employer] in [place]. No transfusion history. No risk factor for HIV or hepatitis.   ROS:   Positives include: Anorexia, 10 pound weight loss, bilateral mid abdominal pain and back pain, constipation and taking hydrocodone  A complete ROS was otherwise negative.  Physical Exam:  Blood pressure 112/63, pulse 67, temperature 98 F (36.7 C), temperature source Oral, resp. rate 17, height 5\' 7"  (1.702 m), weight 166 lb (75.297 kg).  HEENT: Oropharynx without visible mass, neck without mass Lungs: Clear  bilaterally Cardiac: Regular rate and rhythm Abdomen: No hepatosplenomegaly, no mass, no apparent ascites, tender in the right mid abdomen GU: Testes without mass  Vascular: No leg edema Lymph nodes: No cervical, supra-clavicular, axillary, or inguinal nodes Neurologic: Alert and oriented, the motor exam appears intact in the upper and lower extremities Skin: Resolving ecchymosis at the right groin, no rash Musculoskeletal: No spine tenderness   LAB:  CBC  Lab Results  Component Value Date   WBC 8.7 06/30/2012   HGB 12.7* 06/30/2012   HCT 35.4* 06/30/2012   MCV 83.5 06/30/2012   PLT 260 06/30/2012     CMP      Component Value Date/Time   NA 136 06/30/2012 1050   K 5.0 06/30/2012 1050   CL 102 06/30/2012 1050   CO2 25 06/30/2012 1050   GLUCOSE 114* 06/30/2012 1050   BUN 12 06/30/2012 1050   CREATININE 0.88 06/30/2012 1050   CALCIUM 9.5 06/30/2012 1050   PROT 7.6 06/30/2012 1050   ALBUMIN 3.5 06/30/2012 1050   AST 50* 06/30/2012 1050   ALT 50 06/30/2012 1050   ALKPHOS 165* 06/30/2012 1050   BILITOT 0.6 06/30/2012 1050   GFRNONAA 84* 06/30/2012 1050   GFRAA >90 06/30/2012 1050   07/07/2012-CEA 24.7, CA 19-9 40,775  Radiology: As per history of present illness, I reviewed the abdomen CT from 06/30/2012 with Mr. Kuenzel and his family    Assessment/Plan:   1. Metastatic pancreas cancer-the clinical presentation, elevated CA 19-9, and abdomen CT are consistent with a diagnosis of metastatic pancreas cancer  2. Pain secondary to #1-currently relieved with hydrocodone  3. Acute myocardial infarction 06/16/2012-status post PTCA/drug-eluting stent   Disposition:   Mr. Scurlock appears to have metastatic pancreas cancer. I discussed the high likelihood of this diagnosis with the patient and his family. I recommend proceeding with a diagnostic biopsy to confirm the diagnosis of pancreas adenocarcinoma.  He understands no therapy will be curative. We discussed supportive care and  systemic treatment options. We discussed FOLFIRINOX and gemcitabine/Abraxane. I recommend treatment with gemcitabine/Abraxane. We reviewed the potential toxicities associated with this regimen including the chance for nausea/vomiting, mucositis, diarrhea, alopecia, and hematologic toxicity. We discussed the fever, allergic reaction, and pneumonitis associated with gemcitabine. We discussed the potential for neuropathy with Abraxane.  He would like to proceed with a trial of chemotherapy. I contacted Dr. Sharyn Lull and he will arrange for discontinuation of antiplatelet therapy in order for a biopsy to be performed. He recommends discontinuing the Brilinta after 07/11/2012. Mr. Streight will be admitted and placed on Integrilin 07/14/2012. We will ask interventional radiology to place a  Port-A-Cath at the time of the liver biopsy.  Mr. Turano will attend a chemotherapy teaching class. He is scheduled for a first cycle of gemcitabine/Abraxane and an office visit on 07/21/2012.  Kamilya Wakeman 07/09/2012, 6:37 PM

## 2012-07-10 ENCOUNTER — Encounter: Payer: Self-pay | Admitting: Internal Medicine

## 2012-07-13 ENCOUNTER — Encounter: Payer: Self-pay | Admitting: *Deleted

## 2012-07-13 ENCOUNTER — Other Ambulatory Visit: Payer: Medicare Other

## 2012-07-13 ENCOUNTER — Inpatient Hospital Stay (HOSPITAL_COMMUNITY)
Admission: AD | Admit: 2012-07-13 | Discharge: 2012-07-17 | DRG: 436 | Disposition: A | Discharge status: Home / Self Care | Payer: Medicare Other | Source: Ambulatory Visit | Attending: Cardiology | Admitting: Cardiology

## 2012-07-13 ENCOUNTER — Encounter (HOSPITAL_COMMUNITY): Payer: Self-pay | Admitting: *Deleted

## 2012-07-13 ENCOUNTER — Ambulatory Visit (INDEPENDENT_AMBULATORY_CARE_PROVIDER_SITE_OTHER): Payer: 59 | Admitting: Professional

## 2012-07-13 ENCOUNTER — Other Ambulatory Visit: Payer: Self-pay | Admitting: *Deleted

## 2012-07-13 DIAGNOSIS — F4323 Adjustment disorder with mixed anxiety and depressed mood: Secondary | ICD-10-CM

## 2012-07-13 DIAGNOSIS — C259 Malignant neoplasm of pancreas, unspecified: Secondary | ICD-10-CM | POA: Diagnosis present

## 2012-07-13 DIAGNOSIS — Z79899 Other long term (current) drug therapy: Secondary | ICD-10-CM

## 2012-07-13 DIAGNOSIS — F17200 Nicotine dependence, unspecified, uncomplicated: Secondary | ICD-10-CM | POA: Diagnosis present

## 2012-07-13 DIAGNOSIS — F411 Generalized anxiety disorder: Secondary | ICD-10-CM | POA: Diagnosis present

## 2012-07-13 DIAGNOSIS — E7800 Pure hypercholesterolemia, unspecified: Secondary | ICD-10-CM | POA: Diagnosis present

## 2012-07-13 DIAGNOSIS — D649 Anemia, unspecified: Secondary | ICD-10-CM | POA: Diagnosis present

## 2012-07-13 DIAGNOSIS — C229 Malignant neoplasm of liver, not specified as primary or secondary: Secondary | ICD-10-CM

## 2012-07-13 DIAGNOSIS — K8689 Other specified diseases of pancreas: Secondary | ICD-10-CM

## 2012-07-13 DIAGNOSIS — C787 Secondary malignant neoplasm of liver and intrahepatic bile duct: Principal | ICD-10-CM | POA: Diagnosis present

## 2012-07-13 DIAGNOSIS — I2510 Atherosclerotic heart disease of native coronary artery without angina pectoris: Secondary | ICD-10-CM | POA: Diagnosis present

## 2012-07-13 DIAGNOSIS — Z7982 Long term (current) use of aspirin: Secondary | ICD-10-CM

## 2012-07-13 DIAGNOSIS — I252 Old myocardial infarction: Secondary | ICD-10-CM

## 2012-07-13 DIAGNOSIS — Z8249 Family history of ischemic heart disease and other diseases of the circulatory system: Secondary | ICD-10-CM

## 2012-07-13 LAB — COMPREHENSIVE METABOLIC PANEL
ALT: 58 U/L — ABNORMAL HIGH (ref 0–53)
BUN: 19 mg/dL (ref 6–23)
Calcium: 9.1 mg/dL (ref 8.4–10.5)
Creatinine, Ser: 1.05 mg/dL (ref 0.50–1.35)
GFR calc Af Amer: 80 mL/min — ABNORMAL LOW (ref >90)
Glucose, Bld: 106 mg/dL — ABNORMAL HIGH (ref 70–99)
Sodium: 134 mEq/L — ABNORMAL LOW (ref 135–145)
Total Protein: 7.3 g/dL (ref 6.0–8.3)

## 2012-07-13 LAB — CBC
Hemoglobin: 11.3 g/dL — ABNORMAL LOW (ref 13.0–17.0)
MCH: 28.7 pg (ref 26.0–34.0)
MCHC: 33.3 g/dL (ref 30.0–36.0)

## 2012-07-13 LAB — PROTIME-INR
INR: 1.25 (ref 0.00–1.49)
Prothrombin Time: 15.5 seconds — ABNORMAL HIGH (ref 11.6–15.2)

## 2012-07-13 LAB — APTT: aPTT: 37 seconds (ref 24–37)

## 2012-07-13 MED ORDER — HEPARIN SODIUM (PORCINE) 5000 UNIT/ML IJ SOLN
5000.0000 [IU] | Freq: Three times a day (TID) | INTRAMUSCULAR | Status: DC
  Administered 2012-07-13 – 2012-07-15 (×6): 5000 [IU] via SUBCUTANEOUS
  Filled 2012-07-13 (×8): qty 1

## 2012-07-13 MED ORDER — ASPIRIN EC 81 MG PO TBEC
81.0000 mg | DELAYED_RELEASE_TABLET | Freq: Every day | ORAL | Status: DC
  Administered 2012-07-14 – 2012-07-17 (×4): 81 mg via ORAL
  Filled 2012-07-13 (×5): qty 1

## 2012-07-13 MED ORDER — DOCUSATE SODIUM 100 MG PO CAPS
100.0000 mg | ORAL_CAPSULE | ORAL | Status: DC | PRN
  Administered 2012-07-14 – 2012-07-16 (×2): 100 mg via ORAL
  Filled 2012-07-13: qty 1

## 2012-07-13 MED ORDER — HYDROCODONE-ACETAMINOPHEN 5-325 MG PO TABS
1.0000 | ORAL_TABLET | Freq: Four times a day (QID) | ORAL | Status: DC | PRN
  Administered 2012-07-14 – 2012-07-17 (×8): 1 via ORAL
  Filled 2012-07-13 (×9): qty 1

## 2012-07-13 MED ORDER — LIDOCAINE-PRILOCAINE 2.5-2.5 % EX CREA: TOPICAL_CREAM | CUTANEOUS | Status: DC | PRN

## 2012-07-13 MED ORDER — ATORVASTATIN CALCIUM 80 MG PO TABS
80.0000 mg | ORAL_TABLET | Freq: Every day | ORAL | Status: DC
  Administered 2012-07-13 – 2012-07-16 (×4): 80 mg via ORAL
  Filled 2012-07-13 (×5): qty 1

## 2012-07-13 MED ORDER — CARVEDILOL 6.25 MG PO TABS
6.2500 mg | ORAL_TABLET | Freq: Two times a day (BID) | ORAL | Status: DC
  Administered 2012-07-13 – 2012-07-17 (×8): 6.25 mg via ORAL
  Filled 2012-07-13 (×10): qty 1

## 2012-07-13 MED ORDER — SODIUM CHLORIDE 0.9 % IJ SOLN
3.0000 mL | Freq: Two times a day (BID) | INTRAMUSCULAR | Status: DC
  Administered 2012-07-13 – 2012-07-16 (×2): 3 mL via INTRAVENOUS

## 2012-07-13 MED ORDER — EPTIFIBATIDE 75 MG/100ML IV SOLN
2.0000 ug/kg/min | INTRAVENOUS | Status: DC
  Administered 2012-07-13 – 2012-07-15 (×5): 2 ug/kg/min via INTRAVENOUS
  Filled 2012-07-13 (×9): qty 100

## 2012-07-13 MED ORDER — NITROGLYCERIN 0.4 MG SL SUBL: 0.4000 mg | SUBLINGUAL_TABLET | SUBLINGUAL | Status: DC | PRN

## 2012-07-13 MED ORDER — ALPRAZOLAM 0.5 MG PO TABS
0.5000 mg | ORAL_TABLET | Freq: Three times a day (TID) | ORAL | Status: DC | PRN
  Administered 2012-07-14 – 2012-07-15 (×2): 0.5 mg via ORAL
  Filled 2012-07-13 (×2): qty 1

## 2012-07-13 MED ORDER — PANTOPRAZOLE SODIUM 40 MG PO TBEC
40.0000 mg | DELAYED_RELEASE_TABLET | Freq: Every day | ORAL | Status: DC
  Administered 2012-07-13 – 2012-07-17 (×4): 40 mg via ORAL
  Filled 2012-07-13 (×6): qty 1

## 2012-07-13 MED ORDER — SODIUM CHLORIDE 0.9 % IV SOLN
INTRAVENOUS | Status: DC
  Administered 2012-07-13: 250 mL via INTRAVENOUS
  Administered 2012-07-14: 18:00:00 via INTRAVENOUS

## 2012-07-13 MED ORDER — RAMIPRIL 1.25 MG PO CAPS
1.2500 mg | ORAL_CAPSULE | Freq: Every day | ORAL | Status: DC
  Administered 2012-07-14 – 2012-07-17 (×4): 1.25 mg via ORAL
  Filled 2012-07-13 (×5): qty 1

## 2012-07-13 MED ORDER — EPTIFIBATIDE BOLUS VIA INFUSION
180.0000 ug/kg | Freq: Once | INTRAVENOUS | Status: AC
  Administered 2012-07-13: 13500 ug via INTRAVENOUS
  Filled 2012-07-13: qty 18

## 2012-07-13 MED ORDER — PROCHLORPERAZINE MALEATE 10 MG PO TABS
10.0000 mg | ORAL_TABLET | Freq: Four times a day (QID) | ORAL | Status: DC | PRN
  Filled 2012-07-13: qty 1

## 2012-07-13 MED ORDER — PROCHLORPERAZINE MALEATE 10 MG PO TABS: 10.0000 mg | ORAL_TABLET | Freq: Four times a day (QID) | ORAL | Status: DC | PRN

## 2012-07-13 NOTE — Progress Notes (Signed)
Dr Sharyn Lull here to see the pt.

## 2012-07-13 NOTE — H&P (Signed)
Randall [name] is an 72 y.o. male.   Chief Complaint: Vague generalized abdominal pain HPI: Patient is 72 year old male with past medical history significant for coronary artery disease status post recent anterolateral wall myocardial infarction status post PTCA stenting to 100% occluded LAD approximately one month ago, multivessel coronary artery disease, hypercholesteremia, tobacco abuse, positive family history of coronary artery disease, father died of MI in his 74s, recently noted to have vague abdominal pain associated with nausea subsequently and CT of the abdomen showed pancreatic mass with metastases to liver. Patient is admitted here for bridge with Integrilin as patient is scheduled for liver biopsy and Port-A-Cath insertion for definite diagnosis in next few days. Patient has stopped brilinta 2 days ago. Patient presently denies any chest pain nausea or vomiting diaphoresis. Denies palpitation lightheadedness or syncopal episode. Denies any PND orthopnea leg swelling. Patient complains of vague abdominal pain which is being managed with narcotics. Patient denies any nausea vomiting complains of mild constipation. Patient denies any fever chills   Past Medical History  Diagnosis Date  . Anxiety   . Pancreatic cancer metastasized to liver   . MI (myocardial infarction)   . HTN (hypertension)   . Right rotator cuff tear   . Diverticulosis     Past Surgical History  Procedure Laterality Date  . Cardiac surgery      MI with stent placement    Family History  Problem Relation Age of Onset  . Cancer Mother     oropharengial  . Heart attack Father   . Ovarian cancer Sister   . Anuerysm Brother     AAA   Social History:  reports that he has been smoking Cigarettes and Cigars.  He has been smoking about 0.00 packs per day. He has never used smokeless tobacco. He reports that he does not drink alcohol or use illicit drugs.  Allergies: No Known Allergies  Medications Prior to  Admission  Medication Sig Dispense Refill  . acetaminophen (TYLENOL) 500 MG tablet Take 500-1,000 mg by mouth daily as needed for pain.      Marland Kitchen ALPRAZolam (XANAX) 0.5 MG tablet Take 0.5 mg by mouth 3 (three) times daily as needed for anxiety.      Marland Kitchen aspirin EC 81 MG tablet Take 81 mg by mouth daily.      Marland Kitchen atorvastatin (LIPITOR) 80 MG tablet Take 1 tablet (80 mg total) by mouth daily at 6 PM.  30 tablet  1  . carvedilol (COREG) 6.25 MG tablet Take 1 tablet (6.25 mg total) by mouth 2 (two) times daily with a meal.  60 tablet  1  . docusate sodium (COLACE) 100 MG capsule Take 100 mg by mouth as needed for constipation.      Marland Kitchen HYDROcodone-acetaminophen (NORCO/VICODIN) 5-325 MG per tablet Take 1 tablet by mouth every 6 (six) hours as needed for pain.  50 tablet  2  . ramipril (ALTACE) 1.25 MG capsule Take 1 capsule (1.25 mg total) by mouth daily.  30 capsule  1  . Ticagrelor (BRILINTA) 90 MG TABS tablet Take 1 tablet (90 mg total) by mouth 2 (two) times daily.  60 tablet  1  . nitroGLYCERIN (NITROSTAT) 0.4 MG SL tablet Place 1 tablet (0.4 mg total) under the tongue every 5 (five) minutes x 3 doses as needed for chest pain.  25 tablet  0  . prochlorperazine (COMPAZINE) 10 MG tablet Take 1 tablet (10 mg total) by mouth every 6 (six) hours as needed (nausea).  60 tablet  1    No results found for this or any previous visit (from the past 48 hour(s)). No results found.  Review of Systems  Constitutional: Negative for fever and chills.  HENT: Negative for hearing loss.   Eyes: Negative for blurred vision and double vision.  Cardiovascular: Negative for chest pain, palpitations, orthopnea and claudication.  Gastrointestinal: Positive for abdominal pain and constipation. Negative for nausea, vomiting and diarrhea.  Genitourinary: Negative for dysuria.  Neurological: Negative for dizziness and headaches.    Blood pressure 111/52, pulse 71, temperature 97.9 F (36.6 C), temperature source Oral, resp.  rate 18, SpO2 97.00%. Physical Exam  Constitutional: He is oriented to person, place, and time.  HENT:  Head: Normocephalic and atraumatic.  Mouth/Throat: No oropharyngeal exudate.  Eyes: Conjunctivae are normal. Pupils are equal, round, and reactive to light. Left eye exhibits no discharge. No scleral icterus.  Neck: Normal range of motion. Neck supple. No JVD present. No tracheal deviation present. No thyromegaly present.  Cardiovascular: Normal rate and regular rhythm.  Exam reveals no gallop and no friction rub.   No murmur heard. Respiratory: Effort normal and breath sounds normal. No respiratory distress. He has no wheezes. He has no rales.  GI: Soft. Bowel sounds are normal. He exhibits no distension. There is no tenderness.  Musculoskeletal: He exhibits no edema and no tenderness.  Neurological: He is alert and oriented to person, place, and time.     Assessment/Plan Possible metastatic Ca. of pancreas Status post recent anterolateral wall MI status post PCI to LAD Multivessel coronary artery disease  hypercholesteremia History of tobacco abuse Tobacco abuse Anxiety disorder Positive family history of coronary artery disease Plan As per orders Continue home meds Start Integrilin per pharmacy Hold Brilinta Will consult interventional radiology for ultrasound-guided/CT-guided biopsy and Port-A-Cath insertion  Sharea Guinther N 07/13/2012, 6:32 PM

## 2012-07-13 NOTE — Progress Notes (Signed)
MEDICATION RELATED CONSULT NOTE - INITIAL   Pharmacy Consult for Integrelin Indication: bridge in preparation for biopsy in patient with recent MI on Brilinta  No Known Allergies  Patient Measurements:   Adjusted Body Weight:   Vital Signs: Temp: 97.9 F (36.6 C) (05/13 1750) Temp src: Oral (05/13 1750) BP: 111/52 mmHg (05/13 1750) Pulse Rate: 71 (05/13 1750) Intake/Output from previous day:   Intake/Output from this shift:    Labs: No results found for this basename: WBC, HGB, HCT, PLT, APTT, CREATININE, LABCREA, CREATININE, CREAT24HRUR, MG, PHOS, ALBUMIN, PROT, ALBUMIN, AST, ALT, ALKPHOS, BILITOT, BILIDIR, IBILI,  in the last 72 hours The CrCl is unknown because both a height and weight (above a minimum accepted value) are required for this calculation.   Microbiology: Recent Results (from the past 720 hour(s))  MRSA PCR SCREENING     Status: None   Collection Time    06/16/12 10:14 PM      Result Value Range Status   MRSA by PCR NEGATIVE  NEGATIVE Final   Comment:            The GeneXpert MRSA Assay (FDA     approved for NASAL specimens     only), is one component of a     comprehensive MRSA colonization     surveillance program. It is not     intended to diagnose MRSA     infection nor to guide or     monitor treatment for     MRSA infections.    Medical History: Past Medical History  Diagnosis Date  . Anxiety   . Pancreatic cancer metastasized to liver   . MI (myocardial infarction)   . HTN (hypertension)   . Right rotator cuff tear   . Diverticulosis     Medications:  Scheduled:  . aspirin EC  81 mg Oral Daily  . [START ON 07/14/2012] atorvastatin  80 mg Oral q1800  . [START ON 07/14/2012] carvedilol  6.25 mg Oral BID WC  . heparin  5,000 Units Subcutaneous Q8H  . pantoprazole  40 mg Oral Q0600  . ramipril  1.25 mg Oral Daily  . sodium chloride  3 mL Intravenous Q12H   Infusions:  . sodium chloride      Assessment: 72 year old male with past  medical history significant for coronary artery disease status post recent anterolateral wall myocardial infarction status post PTCA stenting to 100% occluded LAD approximately one month ago, multivessel coronary artery disease, hypercholesteremia, tobacco abuse, positive family history of coronary artery disease, father died of MI in his 28s, recently noted to have vague abdominal pain associated with nausea subsequently and CT of the abdomen showed pancreatic mass with metastases to liver. Patient is admitted here for bridge with Integrilin as patient is scheduled for liver biopsy and Port-A-Cath insertion for definite diagnosis in next few days. Patient has stopped brilinta 2 days ago  Awaiting baseline CBC and renal function, but labs drawn on 4/30 are good  Will start Integrilin after baseline labs have been drawn  Goal of Therapy:  Monitor CBC and renal function  Plan: **Only after baseline labs have been drawn, start Integrilin** 1) Due to fact patient has not had dose of Brilinta in a few days, will give standard Integrilin 180 mcg/kg bolus then 2) Integrilin 35mcg/kg/min thereafter 3) Daily CBC and SCr 4) as per cards, generally stop Brilinta a few hours prior to when biopsy is going to be scheduled and PAC insertion   Hessie Knows,  PharmD, BCPS Pager [phone] 07/13/2012 7:37 PM

## 2012-07-13 NOTE — Progress Notes (Signed)
Call placed to Dr Sharyn Lull to return call.

## 2012-07-14 ENCOUNTER — Encounter (HOSPITAL_COMMUNITY): Payer: Self-pay | Admitting: Radiology

## 2012-07-14 LAB — CREATININE, SERUM
Creatinine, Ser: 0.86 mg/dL (ref 0.50–1.35)
GFR calc Af Amer: >90 mL/min (ref >90)
GFR calc non Af Amer: 85 mL/min — ABNORMAL LOW (ref >90)

## 2012-07-14 LAB — CBC
Platelets: 224 10*3/uL (ref 150–400)
RDW: 13.2 % (ref 11.5–15.5)
WBC: 7.7 10*3/uL (ref 4.0–10.5)

## 2012-07-14 MED ORDER — CEFAZOLIN SODIUM-DEXTROSE 2-3 GM-% IV SOLR
2.0000 g | INTRAVENOUS | Status: AC
  Administered 2012-07-16: 2 g via INTRAVENOUS
  Filled 2012-07-14: qty 50

## 2012-07-14 MED ORDER — ENSURE COMPLETE PO LIQD
237.0000 mL | ORAL | Status: DC
  Administered 2012-07-15: 237 mL via ORAL

## 2012-07-14 NOTE — Progress Notes (Signed)
Subjective:  Patient denies any chest pain or shortness of breath. Tolerating Integrilin okay. No evidence of bleeding  Objective:  Vital Signs in the last 24 hours: Temp:  [98 F (36.7 C)-98.7 F (37.1 C)] 98 F (36.7 C) (05/14 1401) Pulse Rate:  [56-65] 65 (05/14 1401) Resp:  [16-18] 18 (05/14 1401) BP: (105-117)/(62-68) 105/68 mmHg (05/14 1401) SpO2:  [97 %-98 %] 98 % (05/14 1401)  Intake/Output from previous day:   Intake/Output from this shift:    Physical Exam: Neck: no adenopathy, no carotid bruit, no JVD and supple, symmetrical, trachea midline Lungs: clear to auscultation bilaterally Heart: regular rate and rhythm, S1, S2 normal, no murmur, click, rub or gallop Abdomen: soft, non-tender; bowel sounds normal; no masses,  no organomegaly Extremities: extremities normal, atraumatic, no cyanosis or edema  Lab Results:  Recent Labs  07/13/12 2028 07/14/12 0514  WBC 8.1 7.7  HGB 11.3* 11.0*  PLT 229 224    Recent Labs  07/13/12 2028 07/14/12 0514  NA 134*  --   K 4.3  --   CL 99  --   CO2 26  --   GLUCOSE 106*  --   BUN 19  --   CREATININE 1.05 0.86   No results found for this basename: TROPONINI, CK, MB,  in the last 72 hours Hepatic Function Panel  Recent Labs  07/13/12 2028  PROT 7.3  ALBUMIN 3.4*  AST 50*  ALT 58*  ALKPHOS 198*  BILITOT 0.4   No results found for this basename: CHOL,  in the last 72 hours No results found for this basename: PROTIME,  in the last 72 hours  Imaging: Imaging results have been reviewed and No results found.  Cardiac Studies:  Assessment/Plan:  Possible metastatic Ca. of pancreas  Status post recent anterolateral wall MI status post PCI to LAD  Multivessel coronary artery disease  hypercholesteremia  History of tobacco abuse  Tobacco abuse  Anxiety disorder  Positive family history of coronary artery disease  Plan Continue present management Schedule for liver biopsy and Port-A-Cath insertion on  Friday   LOS: 1 day    Mccartney Chuba N 07/14/2012, 7:10 PM

## 2012-07-14 NOTE — Progress Notes (Signed)
CSW provided patient with Advance Directives packet. Patient to review with family & will call CSW tomorrow when ready to complete, business card provided to patient as well.  Clinical Social Worker encouraged patient/family to contact with any additional questions or concerns.  Unice Bailey, LCSW [facility] Clinical Social Worker cell #: [phone]

## 2012-07-14 NOTE — Progress Notes (Signed)
INITIAL NUTRITION ASSESSMENT  DOCUMENTATION CODES Per approved criteria  -Non-severe (moderate) malnutrition in the context of chronic illness  Pt meets criteria for moderate MALNUTRITION in the context of chronic illness as evidenced by estimated energy intake <75% of needs for 1 month and 6% wt loss in 1 month.  INTERVENTION: Encouraged snacking and PO intake >75% of meals Provide Ensure Complete once daily Recommend liberalizing diet to regular due to poor appetite and increased calorie needs.  NUTRITION DIAGNOSIS: Unintentional wt loss related to poor appetite/medical condition as evidenced by 6% wt loss in 1 month.   Goal: Pt to meet >/= 90% of their estimated nutrition needs  Monitor:  PO intake Weight Labs  Reason for Assessment: MST  72 y.o. 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 Recently noted to have vague abdominal pain associated with nausea subsequently and CT of the abdomen showed pancreatic mass with metastases to liver. Patient is admitted here for bridge with Integrilin as patient is scheduled for liver biopsy and Port-A-Cath insertion for definite diagnosis in next few days. Patient complains of vague abdominal pain which is being managed with narcotics.  Pt reports that since his heart attack one month ago he has had a decreased appetite and is disinterested in eating. Pt states that recently he has been trying to eat smaller more frequent amounts and if food is put in front of him he will eat it but, he eats much less than he used to. Pt reports that his usual body weight is between 178 and 180 lbs; when he was discharged after his heart  attack he weighed 175 lbs. Pt reports that he tries to eat healthy and typically gets protein-rich foods throughout the day. Encouraged pt to get adequate calories and protein daily and to maintain weight. Pt made aware that snacks are available upon request throughout the day.  Height: Ht Readings from Last 1 Encounters:  07/13/12 5\' 7"  (1.702 m)    Weight: Wt Readings from Last 1 Encounters:  07/13/12 165 lb (74.844 kg)    Ideal Body Weight: 148 lbs  % Ideal Body Weight: 111%  Wt Readings from Last 10 Encounters:  07/13/12 165 lb (74.844 kg)  07/09/12 166 lb (75.297 kg)  07/07/12 166 lb (75.297 kg)  07/07/12 165 lb 4 oz (74.957 kg)  07/01/12 174 lb (78.926 kg)  06/30/12 174 lb (78.926 kg)  06/22/12 174 lb (78.926 kg)  06/16/12 173 lb 15.1 oz (78.9 kg)  06/16/12 173 lb 15.1 oz (78.9 kg)  06/14/12 176 lb (79.833 kg)    Usual Body Weight: 178 lbs  % Usual Body Weight: 93%  BMI:  Body mass index is 25.84 kg/(m^2).  Estimated Nutritional Needs: Kcal: 2095-2470 Protein: 90-112 grams Fluid: 2.5 L  Skin: WDL  Diet Order: Cardiac  EDUCATION NEEDS: -No education needs identified at this time   Intake/Output Summary (Last 24 hours) at 07/14/12 1558 Last data filed at 07/14/12 1500  Gross per 24 hour  Intake    636 ml  Output      0 ml  Net    636 ml    Last BM: 5/13  Labs:   Recent Labs Lab 07/13/12 2028 07/14/12 0514  NA 134*  --   K  4.3  --   CL 99  --   CO2 26  --   BUN 19  --   CREATININE 1.05 0.86  CALCIUM 9.1  --   GLUCOSE 106*  --     CBG (last 3)  No results found for this basename: GLUCAP,  in the last 72 hours  Scheduled Meds: . aspirin EC  81 mg Oral Daily  . atorvastatin  80 mg Oral q1800  . carvedilol  6.25 mg Oral BID WC  . heparin  5,000 Units Subcutaneous Q8H  . pantoprazole  40 mg Oral Q0600  . ramipril  1.25 mg Oral Daily  . sodium chloride  3 mL Intravenous Q12H    Continuous Infusions: . sodium chloride 250 mL  (07/13/12 2106)  . eptifibatide 2 mcg/kg/min (07/14/12 1610)    Past Medical History  Diagnosis Date  . Anxiety   . Pancreatic cancer metastasized to liver   . MI (myocardial infarction)   . HTN (hypertension)   . Right rotator cuff tear   . Diverticulosis     Past Surgical History  Procedure Laterality Date  . Cardiac surgery      MI with stent placement    Ian Malkin RD, LDN Inpatient Clinical Dietitian Pager: [phone] After Hours Pager: [phone]

## 2012-07-14 NOTE — H&P (Signed)
HPI: Randall [name] is an 72 y.o. male who is found to have pancreatic mass and liver lesions concerning for mets. He also had an MI a few weeks ago requiring cardiac cath and stent placement. He has been placed on Brilinta since then. The medical and Oncology teams would like to get tissue diagnosis and have ordered US liver lesion biopsy. Due to his recent MI and stent, if his Marden Noble is held, he needs to have an Integrelin bridge. The pt stopped his Brilinta as of 5/11 and he has been admitted and is started on IV Integrelin. IR is also asked to place Portacath for anticipated chemotherapy needs at the same time as the biopsy. On 5/16, Brilinta will have been held 5 days and procedures can be performed with reduced risk of bleeding complications.  PMHx and meds reviewed. The pt otherwise feels well.  Past Medical History:  Past Medical History  Diagnosis Date  . Anxiety   . Pancreatic cancer metastasized to liver   . MI (myocardial infarction)   . HTN (hypertension)   . Right rotator cuff tear   . Diverticulosis     Past Surgical History:  Past Surgical History  Procedure Laterality Date  . Cardiac surgery      MI with stent placement    Family History:  Family History  Problem Relation Age of Onset  . Cancer Mother     oropharengial  . Heart attack Father   . Ovarian cancer Sister   . Anuerysm Brother     AAA    Social History:  reports that he has been smoking Cigarettes and Cigars.  He has been smoking about 0.00 packs per day. He has never used smokeless tobacco. He reports that he does not drink alcohol or use illicit drugs.  Allergies: No Known Allergies  Medications: Medications Prior to Admission  Medication Sig Dispense Refill  . acetaminophen (TYLENOL) 500 MG tablet Take 500-1,000 mg by mouth daily as needed for pain.      Marland Kitchen ALPRAZolam (XANAX) 0.5 MG tablet Take 0.5 mg by mouth 3 (three) times daily as needed for anxiety.      Marland Kitchen aspirin EC 81 MG  tablet Take 81 mg by mouth daily.      Marland Kitchen atorvastatin (LIPITOR) 80 MG tablet Take 1 tablet (80 mg total) by mouth daily at 6 PM.  30 tablet  1  . carvedilol (COREG) 6.25 MG tablet Take 1 tablet (6.25 mg total) by mouth 2 (two) times daily with a meal.  60 tablet  1  . docusate sodium (COLACE) 100 MG capsule Take 100 mg by mouth as needed for constipation.      Marland Kitchen HYDROcodone-acetaminophen (NORCO/VICODIN) 5-325 MG per tablet Take 1 tablet by mouth every 6 (six) hours as needed for pain.  50 tablet  2  . ramipril (ALTACE) 1.25 MG capsule Take 1 capsule (1.25 mg total) by mouth daily.  30 capsule  1  . Ticagrelor (BRILINTA) 90 MG TABS tablet Take 1 tablet (90 mg total) by mouth 2 (two) times daily.  60 tablet  1  . nitroGLYCERIN (NITROSTAT) 0.4 MG SL tablet Place 1 tablet (0.4 mg total) under the tongue every 5 (five) minutes x 3 doses as needed for chest pain.  25 tablet  0  . prochlorperazine (COMPAZINE) 10 MG tablet Take 1 tablet (10 mg total) by mouth every 6 (six) hours as needed (nausea).  60 tablet  1    Please HPI for pertinent positives,  otherwise complete 10 system ROS negative.  Physical Exam: Blood pressure 105/68, pulse 65, temperature 98 F (36.7 C), temperature source Oral, resp. rate 18, height 5\' 7"  (1.702 m), weight 165 lb (74.844 kg), SpO2 98.00%. Body mass index is 25.84 kg/(m^2).   General Appearance:  Alert, cooperative, no distress, appears stated age  Head:  Normocephalic, without obvious abnormality, atraumatic  ENT: Unremarkable  Neck: Supple, symmetrical, trachea midline  Lungs:   Clear to auscultation bilaterally, no w/r/r, respirations unlabored without use of accessory muscles.  Chest Wall:  No tenderness or deformity  Heart:  Regular rate and rhythm, S1, S2 normal, no murmur, rub or gallop.   Abdomen:   Soft, non-tender, non distended. Bowel sounds active all four quadrants,  no masses, no organomegaly.  Neurologic: Normal affect, no gross deficits.   Results  for orders placed during the hospital encounter of 07/13/12 (from the past 48 hour(s))  CBC     Status: Abnormal   Collection Time    07/13/12  8:28 PM      Result Value Range   WBC 8.1  4.0 - 10.5 K/uL   RBC 3.94 (*) 4.22 - 5.81 MIL/uL   Hemoglobin 11.3 (*) 13.0 - 17.0 g/dL   HCT 09.8 (*) 11.9 - 14.7 %   MCV 86.0  78.0 - 100.0 fL   MCH 28.7  26.0 - 34.0 pg   MCHC 33.3  30.0 - 36.0 g/dL   RDW 82.9  56.2 - 13.0 %   Platelets 229  150 - 400 K/uL  COMPREHENSIVE METABOLIC PANEL     Status: Abnormal   Collection Time    07/13/12  8:28 PM      Result Value Range   Sodium 134 (*) 135 - 145 mEq/L   Potassium 4.3  3.5 - 5.1 mEq/L   Chloride 99  96 - 112 mEq/L   CO2 26  19 - 32 mEq/L   Glucose, Bld 106 (*) 70 - 99 mg/dL   BUN 19  6 - 23 mg/dL   Creatinine, Ser 8.65  0.50 - 1.35 mg/dL   Calcium 9.1  8.4 - 78.4 mg/dL   Total Protein 7.3  6.0 - 8.3 g/dL   Albumin 3.4 (*) 3.5 - 5.2 g/dL   AST 50 (*) 0 - 37 U/L   ALT 58 (*) 0 - 53 U/L   Alkaline Phosphatase 198 (*) 39 - 117 U/L   Total Bilirubin 0.4  0.3 - 1.2 mg/dL   GFR calc non Af Amer 69 (*) >90 mL/min   GFR calc Af Amer 80 (*) >90 mL/min   Comment:            The eGFR has been calculated     using the CKD EPI equation.     This calculation has not been     validated in all clinical     situations.     eGFR's persistently     <90 mL/min signify     possible Chronic Kidney Disease.  APTT     Status: None   Collection Time    07/13/12  8:28 PM      Result Value Range   aPTT 37  24 - 37 seconds   Comment:            IF BASELINE aPTT IS ELEVATED,     SUGGEST PATIENT RISK ASSESSMENT     BE USED TO DETERMINE APPROPRIATE     ANTICOAGULANT THERAPY.  PROTIME-INR  Status: Abnormal   Collection Time    07/13/12  8:28 PM      Result Value Range   Prothrombin Time 15.5 (*) 11.6 - 15.2 seconds   INR 1.25  0.00 - 1.49  CBC     Status: Abnormal   Collection Time    07/14/12  5:14 AM      Result Value Range   WBC 7.7  4.0 - 10.5  K/uL   RBC 3.86 (*) 4.22 - 5.81 MIL/uL   Hemoglobin 11.0 (*) 13.0 - 17.0 g/dL   HCT 91.4 (*) 78.2 - 95.6 %   MCV 86.5  78.0 - 100.0 fL   MCH 28.5  26.0 - 34.0 pg   MCHC 32.9  30.0 - 36.0 g/dL   RDW 21.3  08.6 - 57.8 %   Platelets 224  150 - 400 K/uL  CREATININE, SERUM     Status: Abnormal   Collection Time    07/14/12  5:14 AM      Result Value Range   Creatinine, Ser 0.86  0.50 - 1.35 mg/dL   GFR calc non Af Amer 85 (*) >90 mL/min   GFR calc Af Amer >90  >90 mL/min   Comment:            The eGFR has been calculated     using the CKD EPI equation.     This calculation has not been     validated in all clinical     situations.     eGFR's persistently     <90 mL/min signify     possible Chronic Kidney Disease.   No results found.  Assessment/Plan Pancreatic mass with liver lesions Discussed US guided liver lesion biopsy with pt and family. Explained each procedure, risks of both, including bleeding, infection, use of sedation. CAD/Recent MI Brilinta has been held since 5/11 IV Integrelin will need to be stopped approx 8 hrs prior to procedure per our(IR) protocol. We have him tentatively on schedule for ~1230 on Fri 5/16 and will order to stop Integrelin at ~0400 that am. SubQ heparin will also need to be held day of procedure. Labs reviewed. Case reviewed with Dr. Lowella Dandy,, who will be IR MD that day. Consent signed in chart   Brayton El PA-C 07/14/2012, 4:23 PM

## 2012-07-15 ENCOUNTER — Ambulatory Visit: Payer: Self-pay | Admitting: Oncology

## 2012-07-15 ENCOUNTER — Ambulatory Visit: Payer: Self-pay

## 2012-07-15 LAB — CBC
MCH: 27.6 pg (ref 26.0–34.0)
MCHC: 32.2 g/dL (ref 30.0–36.0)
RDW: 13.4 % (ref 11.5–15.5)

## 2012-07-15 LAB — CREATININE, SERUM
Creatinine, Ser: 0.84 mg/dL (ref 0.50–1.35)
GFR calc non Af Amer: 85 mL/min — ABNORMAL LOW (ref >90)

## 2012-07-15 MED ORDER — EPTIFIBATIDE 75 MG/100ML IV SOLN
2.0000 ug/kg/min | INTRAVENOUS | Status: AC
  Administered 2012-07-16: 2 ug/kg/min via INTRAVENOUS
  Filled 2012-07-15 (×3): qty 100

## 2012-07-15 MED ORDER — HEPARIN SODIUM (PORCINE) 5000 UNIT/ML IJ SOLN
5000.0000 [IU] | Freq: Three times a day (TID) | INTRAMUSCULAR | Status: DC
  Administered 2012-07-16 – 2012-07-17 (×2): 5000 [IU] via SUBCUTANEOUS
  Filled 2012-07-15 (×5): qty 1

## 2012-07-15 MED ORDER — HEPARIN SODIUM (PORCINE) 5000 UNIT/ML IJ SOLN
5000.0000 [IU] | Freq: Once | INTRAMUSCULAR | Status: AC
  Administered 2012-07-15: 5000 [IU] via SUBCUTANEOUS
  Filled 2012-07-15: qty 1

## 2012-07-15 NOTE — Progress Notes (Signed)
Subjective:  Well denies any chest pain or shortness of breath. No evidence of bleeding. Tolerating Integrilin okay  Objective:  Vital Signs in the last 24 hours: Temp:  [97.1 F (36.2 C)-98.2 F (36.8 C)] 97.5 F (36.4 C) (05/15 1438) Pulse Rate:  [59-81] 63 (05/15 1438) Resp:  [18-20] 18 (05/15 1438) BP: (104-109)/(51-70) 105/70 mmHg (05/15 1438) SpO2:  [98 %-100 %] 100 % (05/15 1438)  Intake/Output from previous day: 05/14 0701 - 05/15 0700 In: 1150.2 [P.O.:820; I.V.:330.2] Out: -  Intake/Output from this shift: Total I/O In: 480 [P.O.:480] Out: -   Physical Exam: Neck: no adenopathy, no carotid bruit, no JVD and supple, symmetrical, trachea midline Lungs: clear to auscultation bilaterally Heart: regular rate and rhythm, S1, S2 normal, no murmur, click, rub or gallop Abdomen: soft, non-tender; bowel sounds normal; no masses,  no organomegaly Extremities: extremities normal, atraumatic, no cyanosis or edema  Lab Results:  Recent Labs  07/14/12 0514 07/15/12 0445  WBC 7.7 7.0  HGB 11.0* 9.9*  PLT 224 183    Recent Labs  07/13/12 2028 07/14/12 0514 07/15/12 0445  NA 134*  --   --   K 4.3  --   --   CL 99  --   --   CO2 26  --   --   GLUCOSE 106*  --   --   BUN 19  --   --   CREATININE 1.05 0.86 0.84   No results found for this basename: TROPONINI, CK, MB,  in the last 72 hours Hepatic Function Panel  Recent Labs  07/13/12 2028  PROT 7.3  ALBUMIN 3.4*  AST 50*  ALT 58*  ALKPHOS 198*  BILITOT 0.4   No results found for this basename: CHOL,  in the last 72 hours No results found for this basename: PROTIME,  in the last 72 hours  Imaging: Imaging results have been reviewed and No results found.  Cardiac Studies:  Assessment/Plan:  Possible metastatic Ca. of pancreas  Status post recent anterolateral wall MI status post PCI to LAD  Multivessel coronary artery disease  hypercholesteremia  History of tobacco abuse  Tobacco abuse  Anxiety  disorder  Positive family history of coronary artery disease  Acute on chronic anemia rule out GI loss Plan Check CBC in a.m. Check stool for occult blood Okay to DC Integrilin at 4 AM  LOS: 2 days    [name] N 07/15/2012, 5:56 PM

## 2012-07-15 NOTE — Progress Notes (Signed)
CSW assisted patient/family in completing Advance Directives. The patient designated his daughter, Leotis Shames as his primary healthcare agent and other daughter, Lillia Abed as his secondary agent.  Patient also completed healthcare living will.  The patient designates he does not want life prolonging measures in the situations indicated in his living will.  Clinical Social Worker notarized documents and made copies for patient/family. Clinical Social Worker placed copy on shadow chart to be scanned into patient's chart.   Clinical Social Worker encouraged patient/family to contact with any additional questions or concerns.  Unice Bailey, LCSW [facility] Clinical Social Worker cell #: [phone]

## 2012-07-16 ENCOUNTER — Ambulatory Visit (HOSPITAL_COMMUNITY): Payer: Medicare Other

## 2012-07-16 ENCOUNTER — Other Ambulatory Visit (HOSPITAL_COMMUNITY): Payer: Self-pay

## 2012-07-16 ENCOUNTER — Inpatient Hospital Stay (HOSPITAL_COMMUNITY): Payer: Medicare Other

## 2012-07-16 LAB — CBC
Hemoglobin: 9.6 g/dL — ABNORMAL LOW (ref 13.0–17.0)
RBC: 3.47 MIL/uL — ABNORMAL LOW (ref 4.22–5.81)

## 2012-07-16 LAB — CREATININE, SERUM
Creatinine, Ser: 0.84 mg/dL (ref 0.50–1.35)
GFR calc non Af Amer: 85 mL/min — ABNORMAL LOW (ref >90)

## 2012-07-16 MED ORDER — MIDAZOLAM HCL 2 MG/2ML IJ SOLN
INTRAMUSCULAR | Status: AC | PRN
  Administered 2012-07-16 (×3): 1 mg via INTRAVENOUS

## 2012-07-16 MED ORDER — TICAGRELOR 90 MG PO TABS
90.0000 mg | ORAL_TABLET | Freq: Two times a day (BID) | ORAL | Status: DC
  Administered 2012-07-16 – 2012-07-17 (×2): 90 mg via ORAL
  Filled 2012-07-16 (×3): qty 1

## 2012-07-16 MED ORDER — CEFAZOLIN SODIUM-DEXTROSE 2-3 GM-% IV SOLR
INTRAVENOUS | Status: AC
  Administered 2012-07-16: 2000 mg
  Filled 2012-07-16: qty 50

## 2012-07-16 MED ORDER — CEFAZOLIN SODIUM 1-5 GM-% IV SOLN
1.0000 g | Freq: Once | INTRAVENOUS | Status: DC
  Filled 2012-07-16: qty 50

## 2012-07-16 MED ORDER — LIDOCAINE HCL 1 % IJ SOLN
INTRAMUSCULAR | Status: AC
  Filled 2012-07-16: qty 20

## 2012-07-16 MED ORDER — MIDAZOLAM HCL 2 MG/2ML IJ SOLN
INTRAMUSCULAR | Status: AC
  Filled 2012-07-16: qty 6

## 2012-07-16 MED ORDER — GELATIN ABSORBABLE 12-7 MM EX MISC
1.0000 | Freq: Once | CUTANEOUS | Status: AC
  Administered 2012-07-16: 1 via TOPICAL

## 2012-07-16 MED ORDER — HEPARIN SOD (PORK) LOCK FLUSH 100 UNIT/ML IV SOLN
INTRAVENOUS | Status: AC | PRN
  Administered 2012-07-16: 500 [IU]

## 2012-07-16 MED ORDER — FENTANYL CITRATE 0.05 MG/ML IJ SOLN
INTRAMUSCULAR | Status: AC | PRN
  Administered 2012-07-16: 50 ug via INTRAVENOUS
  Administered 2012-07-16: 100 ug via INTRAVENOUS

## 2012-07-16 MED ORDER — FENTANYL CITRATE 0.05 MG/ML IJ SOLN
INTRAMUSCULAR | Status: AC
  Filled 2012-07-16: qty 6

## 2012-07-16 NOTE — Progress Notes (Signed)
Received pt from PACU post Liver biopsy and Rt. Chest port-a-cath placement. Pt is alert and oriented, able to verbalize needs. VSS, Will continue to monitor.

## 2012-07-16 NOTE — Procedures (Signed)
Post-Procedure Note  Pre-operative Diagnosis: Metastatic disease to liver      Post-operative Diagnosis: Metastatic disease to liver  Indications: Needs tissue diagnosis and IV for chemotherapy  Procedure Details:   1) Placement of right jugular portacath.  2) Ultrasound guided liver lesion biopsy  Findings: Port tip in lower SVC.  Biopsy obtained from right hepatic lobe lesion.    Complications: None     Condition: Stable  Plan: Bedrest 3 hours.  Port is ready to be used.

## 2012-07-16 NOTE — Progress Notes (Signed)
Subjective:  Patient underwent ultrasound-guided liver relation biopsy and Port-A-Cath insertion today just came back from radiology. Tolerated procedure well.  Objective:  Vital Signs in the last 24 hours: Temp:  [97.8 F (36.6 C)-98.4 F (36.9 C)] 97.8 F (36.6 C) (05/16 1815) Pulse Rate:  [63-86] 78 (05/16 1815) Resp:  [8-22] 18 (05/16 1815) BP: (104-137)/(50-76) 120/65 mmHg (05/16 1815) SpO2:  [94 %-100 %] 95 % (05/16 1746)  Intake/Output from previous day: 05/15 0701 - 05/16 0700 In: 1029.8 [P.O.:480; I.V.:549.8] Out: -  Intake/Output from this shift:    Physical Exam: Neck: no adenopathy, no carotid bruit, no JVD and supple, symmetrical, trachea midline Lungs: clear to auscultation bilaterally Heart: regular rate and rhythm, S1, S2 normal, no murmur, click, rub or gallop and Port-A-Cath site dry Abdomen: soft, non-tender; bowel sounds normal; no masses,  no organomegaly and Abdominal wall minimal ecchymosis Extremities: extremities normal, atraumatic, no cyanosis or edema Pulses: 2+ and symmetric  Lab Results:  Recent Labs  07/15/12 0445 07/16/12 0450  WBC 7.0 6.3  HGB 9.9* 9.6*  PLT 183 185    Recent Labs  07/13/12 2028  07/15/12 0445 07/16/12 0450  NA 134*  --   --   --   K 4.3  --   --   --   CL 99  --   --   --   CO2 26  --   --   --   GLUCOSE 106*  --   --   --   BUN 19  --   --   --   CREATININE 1.05  < > 0.84 0.84  < > = values in this interval not displayed. No results found for this basename: TROPONINI, CK, MB,  in the last 72 hours Hepatic Function Panel  Recent Labs  07/13/12 2028  PROT 7.3  ALBUMIN 3.4*  AST 50*  ALT 58*  ALKPHOS 198*  BILITOT 0.4   No results found for this basename: CHOL,  in the last 72 hours No results found for this basename: PROTIME,  in the last 72 hours  Imaging: Imaging results have been reviewed and Ir Fluoro Guide Cv Line Right  07/16/2012   *RADIOLOGY REPORT*  Clinical Data: 72 year old with a  pancreatic mass and multiple liver lesions. Imaging findings are suspicious for metastatic disease.  Recent myocardial infarction requiring cardiac stent placement.  The patient was on Brilinta and admitted for an Integrelin bridge. Tissue diagnosis is needed and the patient needs a Port-A-Cath for  chemotherapy.  FLUOROSCOPIC AND ULTRASOUND GUIDED PLACEMENT OF A SUBCUTANEOUS PORT.  ULTRASOUND GUIDED LIVER LESION BIOPSY  Physician: Rachelle Hora. Henn, MD  Medications:Versed 2 mg, Fentanyl 100 mcg, Ancef 2 gm.  A radiology nurse monitored the patient for moderate sedation.  As antibiotic prophylaxis, Ancef  was ordered pre-procedure and administered intravenously within one hour of incision.  Moderate sedation time:30 minutes  Fluoroscopy time: 18 seconds  Procedure:  The risks of the procedure were explained to the patient.  Informed consent was obtained.  Patient was placed supine on the interventional table.  Ultrasound confirmed a patent right internal jugular vein.  The right chest and neck were cleaned with a skin antiseptic and a sterile drape was placed.  Maximal barrier sterile technique was utilized including caps, mask, sterile gowns, sterile gloves, sterile drape, hand hygiene and skin antiseptic. The right neck was anesthetized with 1% lidocaine.  Small incision was made in the right neck with a blade.  Micropuncture set was  placed in the right IJ with ultrasound guidance.  The micropuncture wire was used for measurement purposes.  The right chest was anesthetized with 1% lidocaine with epinephrine.  #15 blade was used to make an incision and a subcutaneous port pocket was formed. 8 french Power Port was assembled.  Subcutaneous tunnel was formed with a stiff tunneling device.  The port catheter was brought through the subcutaneous tunnel.  The port was placed in the subcutaneous pocket.  The micropuncture set was exchanged for a peel-away sheath.  The catheter was placed through the peel-away sheath and the  tip was positioned in the lower SVC.  Catheter placement was confirmed with fluoroscopy.  The port was accessed and flushed with heparinized saline.  The port pocket was closed using two layers of absorbable sutures and Dermabond.  The vein skin site was closed using a single layer of absorbable suture and Dermabond.  Sterile dressings were applied.  Patient tolerated the procedure well without an immediate complication.  Ultrasound and fluoroscopic images were taken and saved for this procedure.  Attention was directed to performing the liver biopsy.  The abdomen was evaluated with ultrasound.  Multiple liver lesions were identified.  Lesion in the lower right hepatic lobe were selected for biopsy.  The right lower abdomen was prepped and draped in a sterile fashion.   Maximal barrier sterile technique was utilized including caps, mask, sterile gowns, sterile gloves, sterile drape, hand hygiene and skin antiseptic.   The skin and liver capsule were anesthetized with lidocaine.  17 gauge guide needle was directed to the right hepatic lobe and placed within a lesion.  Four core biopsies obtained with an 18 gauge core device.  Gelfoam was placed in the biopsy tract.  17 gauge needle was removed without complication.  Specimens were placed in formalin.  Findings:  Port-A-Cath tip in the lower SVC.  Multiple hypoechoic liver lesions.  Prominent lesion in the inferior right hepatic lobe was biopsied.  Complications: None  Impression:  Placement of a subcutaneous port device.  The catheter tip is in the lower SVC and ready to be used.  Ultrasound guided liver lesion biopsy.   Original Report Authenticated By: Richarda Overlie, M.D.   Ir US Guide Vasc Access Right  07/16/2012   *RADIOLOGY REPORT*  Clinical Data: 72 year old with a pancreatic mass and multiple liver lesions. Imaging findings are suspicious for metastatic disease.  Recent myocardial infarction requiring cardiac stent placement.  The patient was on Brilinta and  admitted for an Integrelin bridge. Tissue diagnosis is needed and the patient needs a Port-A-Cath for  chemotherapy.  FLUOROSCOPIC AND ULTRASOUND GUIDED PLACEMENT OF A SUBCUTANEOUS PORT.  ULTRASOUND GUIDED LIVER LESION BIOPSY  Physician: Rachelle Hora. Henn, MD  Medications:Versed 2 mg, Fentanyl 100 mcg, Ancef 2 gm.  A radiology nurse monitored the patient for moderate sedation.  As antibiotic prophylaxis, Ancef  was ordered pre-procedure and administered intravenously within one hour of incision.  Moderate sedation time:30 minutes  Fluoroscopy time: 18 seconds  Procedure:  The risks of the procedure were explained to the patient.  Informed consent was obtained.  Patient was placed supine on the interventional table.  Ultrasound confirmed a patent right internal jugular vein.  The right chest and neck were cleaned with a skin antiseptic and a sterile drape was placed.  Maximal barrier sterile technique was utilized including caps, mask, sterile gowns, sterile gloves, sterile drape, hand hygiene and skin antiseptic. The right neck was anesthetized with 1% lidocaine.  Small incision  was made in the right neck with a blade.  Micropuncture set was placed in the right IJ with ultrasound guidance.  The micropuncture wire was used for measurement purposes.  The right chest was anesthetized with 1% lidocaine with epinephrine.  #15 blade was used to make an incision and a subcutaneous port pocket was formed. 8 french Power Port was assembled.  Subcutaneous tunnel was formed with a stiff tunneling device.  The port catheter was brought through the subcutaneous tunnel.  The port was placed in the subcutaneous pocket.  The micropuncture set was exchanged for a peel-away sheath.  The catheter was placed through the peel-away sheath and the tip was positioned in the lower SVC.  Catheter placement was confirmed with fluoroscopy.  The port was accessed and flushed with heparinized saline.  The port pocket was closed using two layers of  absorbable sutures and Dermabond.  The vein skin site was closed using a single layer of absorbable suture and Dermabond.  Sterile dressings were applied.  Patient tolerated the procedure well without an immediate complication.  Ultrasound and fluoroscopic images were taken and saved for this procedure.  Attention was directed to performing the liver biopsy.  The abdomen was evaluated with ultrasound.  Multiple liver lesions were identified.  Lesion in the lower right hepatic lobe were selected for biopsy.  The right lower abdomen was prepped and draped in a sterile fashion.   Maximal barrier sterile technique was utilized including caps, mask, sterile gowns, sterile gloves, sterile drape, hand hygiene and skin antiseptic.   The skin and liver capsule were anesthetized with lidocaine.  17 gauge guide needle was directed to the right hepatic lobe and placed within a lesion.  Four core biopsies obtained with an 18 gauge core device.  Gelfoam was placed in the biopsy tract.  17 gauge needle was removed without complication.  Specimens were placed in formalin.  Findings:  Port-A-Cath tip in the lower SVC.  Multiple hypoechoic liver lesions.  Prominent lesion in the inferior right hepatic lobe was biopsied.  Complications: None  Impression:  Placement of a subcutaneous port device.  The catheter tip is in the lower SVC and ready to be used.  Ultrasound guided liver lesion biopsy.   Original Report Authenticated By: Richarda Overlie, M.D.   Ir US Guide Bx Asp/drain  07/16/2012   *RADIOLOGY REPORT*  Clinical Data: 72 year old with a pancreatic mass and multiple liver lesions. Imaging findings are suspicious for metastatic disease.  Recent myocardial infarction requiring cardiac stent placement.  The patient was on Brilinta and admitted for an Integrelin bridge. Tissue diagnosis is needed and the patient needs a Port-A-Cath for  chemotherapy.  FLUOROSCOPIC AND ULTRASOUND GUIDED PLACEMENT OF A SUBCUTANEOUS PORT.  ULTRASOUND  GUIDED LIVER LESION BIOPSY  Physician: Rachelle Hora. Henn, MD  Medications:Versed 2 mg, Fentanyl 100 mcg, Ancef 2 gm.  A radiology nurse monitored the patient for moderate sedation.  As antibiotic prophylaxis, Ancef  was ordered pre-procedure and administered intravenously within one hour of incision.  Moderate sedation time:30 minutes  Fluoroscopy time: 18 seconds  Procedure:  The risks of the procedure were explained to the patient.  Informed consent was obtained.  Patient was placed supine on the interventional table.  Ultrasound confirmed a patent right internal jugular vein.  The right chest and neck were cleaned with a skin antiseptic and a sterile drape was placed.  Maximal barrier sterile technique was utilized including caps, mask, sterile gowns, sterile gloves, sterile drape, hand hygiene and skin  antiseptic. The right neck was anesthetized with 1% lidocaine.  Small incision was made in the right neck with a blade.  Micropuncture set was placed in the right IJ with ultrasound guidance.  The micropuncture wire was used for measurement purposes.  The right chest was anesthetized with 1% lidocaine with epinephrine.  #15 blade was used to make an incision and a subcutaneous port pocket was formed. 8 french Power Port was assembled.  Subcutaneous tunnel was formed with a stiff tunneling device.  The port catheter was brought through the subcutaneous tunnel.  The port was placed in the subcutaneous pocket.  The micropuncture set was exchanged for a peel-away sheath.  The catheter was placed through the peel-away sheath and the tip was positioned in the lower SVC.  Catheter placement was confirmed with fluoroscopy.  The port was accessed and flushed with heparinized saline.  The port pocket was closed using two layers of absorbable sutures and Dermabond.  The vein skin site was closed using a single layer of absorbable suture and Dermabond.  Sterile dressings were applied.  Patient tolerated the procedure well without  an immediate complication.  Ultrasound and fluoroscopic images were taken and saved for this procedure.  Attention was directed to performing the liver biopsy.  The abdomen was evaluated with ultrasound.  Multiple liver lesions were identified.  Lesion in the lower right hepatic lobe were selected for biopsy.  The right lower abdomen was prepped and draped in a sterile fashion.   Maximal barrier sterile technique was utilized including caps, mask, sterile gowns, sterile gloves, sterile drape, hand hygiene and skin antiseptic.   The skin and liver capsule were anesthetized with lidocaine.  17 gauge guide needle was directed to the right hepatic lobe and placed within a lesion.  Four core biopsies obtained with an 18 gauge core device.  Gelfoam was placed in the biopsy tract.  17 gauge needle was removed without complication.  Specimens were placed in formalin.  Findings:  Port-A-Cath tip in the lower SVC.  Multiple hypoechoic liver lesions.  Prominent lesion in the inferior right hepatic lobe was biopsied.  Complications: None  Impression:  Placement of a subcutaneous port device.  The catheter tip is in the lower SVC and ready to be used.  Ultrasound guided liver lesion biopsy.   Original Report Authenticated By: Richarda Overlie, M.D.    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 will DC home tomorrow stable  LOS: 3 days    Randall [name] 07/16/2012, 6:41 PM

## 2012-07-17 LAB — CREATININE, SERUM
Creatinine, Ser: 0.81 mg/dL (ref 0.50–1.35)
GFR calc Af Amer: >90 mL/min (ref >90)

## 2012-07-17 LAB — CBC
MCH: 27.8 pg (ref 26.0–34.0)
Platelets: 207 10*3/uL (ref 150–400)
RBC: 3.85 MIL/uL — ABNORMAL LOW (ref 4.22–5.81)
WBC: 7.7 10*3/uL (ref 4.0–10.5)

## 2012-07-17 MED ORDER — VITAMINS A & D EX OINT
TOPICAL_OINTMENT | CUTANEOUS | Status: AC
  Administered 2012-07-17: 06:00:00
  Filled 2012-07-17: qty 5

## 2012-07-17 NOTE — Discharge Summary (Signed)
NAMENASSER, KU NO.:  [id number]  MEDICAL RECORD NO.:  [id number]  LOCATION:  1435                         FACILITY:  [facility]  PHYSICIAN:  Fredis Malkiewicz N. Sharyn Lull, M.D. DATE OF BIRTH:  08-18-1940  DATE OF ADMISSION:  07/13/2012 DATE OF DISCHARGE:  07/17/2012                              DISCHARGE SUMMARY   ADMITTING DIAGNOSES: 1. 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 2. Hypercholesteremia. 3. History of tobacco abuse. 4. Positive family history of coronary artery disease. 5. Anxiety disorder.  DISCHARGE DIAGNOSES: 1. 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 2. Status post recent anterolateral wall myocardial infarction, status     post PCI to LAD. 3. Multivessel coronary artery disease. 4. Hypercholesteremia. 5. History of tobacco abuse. 6. Anxiety disorder. 7. Positive family history of coronary artery disease.  DISCHARGE HOME MEDICATIONS: 1. Enteric-coated aspirin 81 mg 1 tablet daily. 2. Brilinta 90 mg 1 tablet twice daily. 3. Alprazolam 0.5 mg 3 times daily as needed. 4. Tylenol 500 mg to 1000 mg as needed. 5. Atorvastatin 80 mg 1 tablet daily. 6. Carvedilol 6.25 mg twice daily. 7. Colace 100 mg as needed for constipation. 8. Vicodin 1 tablet every 6 hours for pain as needed. 9. Nitrostat 0.4 mg sublingual use as directed. 10.Compazine 1 tablet every 6 hours as needed for nausea. 11.Ramipril 1.25 mg 1 capsule daily.  DIET:  Low salt, low cholesterol.  ACTIVITY:  Increase activity as tolerated.  FOLLOWUP:  Follow up with Dr. Truett Perna, Oncology next week as scheduled. Follow up with me in 2 weeks.  CONDITION AT DISCHARGE:  Stable.  BRIEF HISTORY AND HOSPITAL COURSE:  Mr. Broughton is a 72 year old male with past medical history significant  for coronary artery disease, status post recent anterolateral wall myocardial infarction, status post PTCA stenting to 100% occluded LAD approximately 1 month ago. Multivessel coronary artery disease, hypercholesteremia, tobacco abuse, positive family history of coronary artery disease.  Father died of MI in his 48s, recently noted to have vague abdominal pain associated with nausea, subsequently had CT of the abdomen which showed pancreatic mass with mets to liver.  The patient was admitted for bridge with Integrilin as the patient is scheduled for liver biopsy and Port-A-Cath insertion for definite diagnosis in few days.  The patient has stopped Brilinta 2 days ago.  The patient presently denies any chest pain, nausea, vomiting, diaphoresis.  Denies palpitation, lightheadedness, or syncopal episode.  Denies any PND, orthopnea, leg swelling.  The patient complains of vague abdominal pain which is being managed with narcotics. The patient denies any nausea, vomiting, but complains of mild constipation.  The patient denies any fever or chills.  PHYSICAL EXAMINATION:  GENERAL:  He was alert, awake, oriented x3. VITAL SIGNS:  Blood pressure was 111/52, pulse was 71.  He was afebrile. EYES:  Conjunctivae was pink. NECK:  Supple.  No JVD.  No bruit. LUNGS:  Clear to auscultation without rhonchi or rales. CARDIOVASCULAR:  S1, S2 was normal.  There was no S3 or gallop. ABDOMEN:  Soft.  Bowel sounds were present.  Nontender. EXTREMITIES:  There is no clubbing, cyanosis, or edema.  LABORATORY DATA:  His admission labs; sodium was 134, potassium 4.3, BUN 19, creatinine 1.05.  His glucose was 106, hemoglobin 11.3, hematocrit 33.9, white count of 8.1 and today hemoglobin is 10.7, hematocrit 33, white count of 7.7, which has been stable, and platelet count is 207,000.  His CA 19-9 was 40,775.  CEA was 24.7.  BRIEF HOSPITAL COURSE:  The patient was admitted to telemetry unit, was started on IV  Integrilin which was discontinued 8 hours prior to liver biopsy.  The patient tolerated the procedure well.  Post procedure, the patient was restarted on Brilinta which he is tolerating it well. Received 1 dose last night and 1 dose this morning.  There is no evidence of bleeding and his liver biopsy site and port access site appears to be okay.  The patient has been ambulating in room without any problems.  The patient will be discharged home on above medications, and will be followed up closely as outpatient as above.     Eduardo Osier. Sharyn Lull, M.D.     MNH/MEDQ  D:  07/17/2012  T:  07/17/2012  Job:  [id number]

## 2012-07-17 NOTE — Discharge Summary (Signed)
  Discharge summary dictated on 07/17/2012 dictation number is [phone]

## 2012-07-18 ENCOUNTER — Encounter: Payer: Self-pay | Admitting: Oncology

## 2012-07-18 ENCOUNTER — Other Ambulatory Visit: Payer: Self-pay | Admitting: Oncology

## 2012-07-18 DIAGNOSIS — C259 Malignant neoplasm of pancreas, unspecified: Secondary | ICD-10-CM

## 2012-07-18 HISTORY — DX: Malignant neoplasm of pancreas, unspecified: C25.9

## 2012-07-19 ENCOUNTER — Telehealth: Payer: Self-pay | Admitting: Internal Medicine

## 2012-07-19 ENCOUNTER — Telehealth: Payer: Self-pay | Admitting: Gastroenterology

## 2012-07-19 ENCOUNTER — Telehealth: Payer: Self-pay | Admitting: *Deleted

## 2012-07-19 NOTE — Telephone Encounter (Signed)
Pt informed, papers are ready for pick up

## 2012-07-19 NOTE — Telephone Encounter (Signed)
Pt would like a call back concerning forms that need to be picked up.

## 2012-07-19 NOTE — Telephone Encounter (Signed)
Notified patient that the path report is not back yet-case was not signed out until late Friday evening. He reports that the appointment at [facility] was changed to 5/27 because [facility] wanted to see path before he got there. Made him aware they will be notified and provided copy of report when available.

## 2012-07-19 NOTE — Telephone Encounter (Signed)
Voice mail from daughter requesting liver biopsy results done Friday, 07/16/12. Goes to [facility] on 07/20/12 and they will need report. MD notified of request.

## 2012-07-19 NOTE — Telephone Encounter (Signed)
Spoke with pts wife and let her know the biopsy results are not back yet.

## 2012-07-20 ENCOUNTER — Other Ambulatory Visit: Payer: Self-pay | Admitting: *Deleted

## 2012-07-20 DIAGNOSIS — C259 Malignant neoplasm of pancreas, unspecified: Secondary | ICD-10-CM

## 2012-07-21 ENCOUNTER — Ambulatory Visit (HOSPITAL_BASED_OUTPATIENT_CLINIC_OR_DEPARTMENT_OTHER): Payer: Medicare Other

## 2012-07-21 ENCOUNTER — Ambulatory Visit (HOSPITAL_BASED_OUTPATIENT_CLINIC_OR_DEPARTMENT_OTHER): Payer: Medicare Other | Admitting: Oncology

## 2012-07-21 ENCOUNTER — Telehealth: Payer: Self-pay | Admitting: Oncology

## 2012-07-21 ENCOUNTER — Other Ambulatory Visit (HOSPITAL_BASED_OUTPATIENT_CLINIC_OR_DEPARTMENT_OTHER): Payer: Medicare Other | Admitting: Lab

## 2012-07-21 VITALS — BP 112/57 | HR 67 | Temp 97.1°F | Resp 18 | Ht 67.0 in | Wt 161.9 lb

## 2012-07-21 DIAGNOSIS — C787 Secondary malignant neoplasm of liver and intrahepatic bile duct: Secondary | ICD-10-CM

## 2012-07-21 DIAGNOSIS — Z5111 Encounter for antineoplastic chemotherapy: Secondary | ICD-10-CM

## 2012-07-21 DIAGNOSIS — C259 Malignant neoplasm of pancreas, unspecified: Secondary | ICD-10-CM

## 2012-07-21 DIAGNOSIS — G893 Neoplasm related pain (acute) (chronic): Secondary | ICD-10-CM

## 2012-07-21 LAB — CBC WITH DIFFERENTIAL/PLATELET
Basophils Absolute: 0 10*3/uL (ref 0.0–0.1)
Eosinophils Absolute: 0.3 10*3/uL (ref 0.0–0.5)
HGB: 10.6 g/dL — ABNORMAL LOW (ref 13.0–17.1)
MCV: 85.6 fL (ref 79.3–98.0)
MONO#: 1.2 10*3/uL — ABNORMAL HIGH (ref 0.1–0.9)
NEUT#: 6.1 10*3/uL (ref 1.5–6.5)
RBC: 3.71 10*6/uL — ABNORMAL LOW (ref 4.20–5.82)
RDW: 13.9 % (ref 11.0–14.6)
WBC: 8.5 10*3/uL (ref 4.0–10.3)
lymph#: 0.8 10*3/uL — ABNORMAL LOW (ref 0.9–3.3)

## 2012-07-21 MED ORDER — ONDANSETRON 8 MG/50ML IVPB (CHCC)
8.0000 mg | Freq: Once | INTRAVENOUS | Status: AC
  Administered 2012-07-21: 8 mg via INTRAVENOUS

## 2012-07-21 MED ORDER — SODIUM CHLORIDE 0.9 % IV SOLN
Freq: Once | INTRAVENOUS | Status: AC
  Administered 2012-07-21: 13:00:00 via INTRAVENOUS

## 2012-07-21 MED ORDER — DEXAMETHASONE SODIUM PHOSPHATE 10 MG/ML IJ SOLN
10.0000 mg | Freq: Once | INTRAMUSCULAR | Status: AC
  Administered 2012-07-21: 10 mg via INTRAVENOUS

## 2012-07-21 MED ORDER — SODIUM CHLORIDE 0.9 % IV SOLN
800.0000 mg/m2 | Freq: Once | INTRAVENOUS | Status: AC
  Administered 2012-07-21: 1520 mg via INTRAVENOUS
  Filled 2012-07-21: qty 40

## 2012-07-21 MED ORDER — PACLITAXEL PROTEIN-BOUND CHEMO INJECTION 100 MG
100.0000 mg/m2 | Freq: Once | INTRAVENOUS | Status: AC
  Administered 2012-07-21: 200 mg via INTRAVENOUS
  Filled 2012-07-21: qty 40

## 2012-07-21 MED ORDER — HEPARIN SOD (PORK) LOCK FLUSH 100 UNIT/ML IV SOLN
500.0000 [IU] | Freq: Once | INTRAVENOUS | Status: AC | PRN
  Administered 2012-07-21: 500 [IU]
  Filled 2012-07-21: qty 5

## 2012-07-21 MED ORDER — SODIUM CHLORIDE 0.9 % IJ SOLN
10.0000 mL | INTRAMUSCULAR | Status: DC | PRN
  Administered 2012-07-21: 10 mL
  Filled 2012-07-21: qty 10

## 2012-07-21 NOTE — Progress Notes (Signed)
   [name]    OFFICE PROGRESS NOTE   INTERVAL HISTORY:   Randall [name] returns as scheduled. He underwent an ultrasound-guided biopsy of a liver lesion and placement of a Port-A-Cath on 07/16/2012. He was admitted in order to continue anticoagulation therapy surrounding the procedure. Brilinta was resumed at discharge. He reports anorexia. He continues to have back and abdominal pain. The pain is relieved with approximately 2 hydrocodone tablets per day. He has attended chemotherapy teaching class. Randall [name] has decided to have a second medical oncology opinion at [facility]. This is scheduled for next week.  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 The tumor cells are positive for monoclonal CEA, polyclonal CEA, cytokeratin 7, MOC-31, and mucicarmine. The tumor cells were negative for Hepar-1, AST, CD10, CD X-2, cytokeratin 20, TTF-1, and Napsin A.  Objective:  Vital signs in last 24 hours:  Blood pressure 112/57, pulse 67, temperature 97.1 F (36.2 C), temperature source Oral, resp. rate 18, height 5\' 7"  (1.702 m), weight 161 lb 14.4 oz (73.437 kg).   Resp: Lungs clear bilaterally Cardio: Regular rate and rhythm GI: No hepatosplenomegaly, resolving ecchymosis at the left lower abdominal wall, mild tenderness in the mid upper abdomen Vascular: No leg edema   Portacath with a surrounding ecchymosis  Lab Results:  Lab Results  Component Value Date   WBC 8.5 07/21/2012   HGB 10.6* 07/21/2012   HCT 31.7* 07/21/2012   MCV 85.6 07/21/2012   PLT 229 07/21/2012   ANC 6.1    Medications: I have reviewed the patient's current medications.  Assessment/Plan: 1. Metastatic pancreas cancer-the clinical presentation, elevated CA 19-9, and abdomen CT are consistent with a diagnosis of metastatic pancreas cancer . 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 2.  Pain secondary to #1-currently relieved with hydrocodone  3. Acute myocardial infarction 06/16/2012-status post PTCA/drug-eluting stent , maintained ob Brilinta 4. Status post Port-A-Cath placement 07/16/2012   Disposition:  Biopsy of a liver lesion confirmed a diagnosis of metastatic pancreas cancer. Randall [name] is scheduled to begin gemcitabine/Abraxane today. We reviewed treatment options again today. He agrees to proceed. He will be scheduled for week 2 of chemotherapy on 07/28/2012. I will see him prior to week 3 of chemotherapy on 08/04/2012.  Randall [name] will continue hydrocodone for pain. If he requires more frequent hydrocodone we will consider adding a long-acting narcotic or referring him for a celiac block. Hopefully the pain will improve with the chemotherapy.   Thornton Papas, MD  07/21/2012  12:03 PM

## 2012-07-21 NOTE — Progress Notes (Signed)
Assisted patient with EMLA cream application while in exam room.

## 2012-07-21 NOTE — Progress Notes (Signed)
Met with patient and daughter, explained role of GI navigator.  Patient has already received information on pancreatic cancer and has attended chemo class.  Urgent referral to dietician for diet education.  Patient and daughter denied other barriers to care at present time, but are aware of the [facility] support resources and stated they would contact this RN if needs arise.  Will continue to follow as needed.

## 2012-07-21 NOTE — Patient Instructions (Addendum)
Gemcitabine injection  What is this medicine?  GEMCITABINE (jem SIT a been) is a chemotherapy drug. This medicine is used to treat many types of cancer like breast cancer, lung cancer, pancreatic cancer, and ovarian cancer.  This medicine may be used for other purposes; ask your health care provider or pharmacist if you have questions.  What should I tell my health care provider before I take this medicine?  They need to know if you have any of these conditions:  -blood disorders  -infection  -kidney disease  -liver disease  -recent or ongoing radiation therapy  -an unusual or allergic reaction to gemcitabine, other chemotherapy, other medicines, foods, dyes, or preservatives  -pregnant or trying to get pregnant  -breast-feeding  How should I use this medicine?  This drug is given as an infusion into a vein. It is administered in a hospital or clinic by a specially trained health care professional.  Talk to your pediatrician regarding the use of this medicine in children. Special care may be needed.  Overdosage: If you think you have taken too much of this medicine contact a poison control center or emergency room at once.  NOTE: This medicine is only for you. Do not share this medicine with others.  What if I miss a dose?  It is important not to miss your dose. Call your doctor or health care professional if you are unable to keep an appointment.  What may interact with this medicine?  -medicines to increase blood counts like filgrastim, pegfilgrastim, sargramostim  -some other chemotherapy drugs like cisplatin  -vaccines  Talk to your doctor or health care professional before taking any of these medicines:  -acetaminophen  -aspirin  -ibuprofen  -ketoprofen  -naproxen  This list may not describe all possible interactions. Give your health care provider a list of all the medicines, herbs, non-prescription drugs, or dietary supplements you use. Also tell them if you smoke, drink alcohol, or use illegal drugs. Some  items may interact with your medicine.  What should I watch for while using this medicine?  Visit your doctor for checks on your progress. This drug may make you feel generally unwell. This is not uncommon, as chemotherapy can affect healthy cells as well as cancer cells. Report any side effects. Continue your course of treatment even though you feel ill unless your doctor tells you to stop.  In some cases, you may be given additional medicines to help with side effects. Follow all directions for their use.  Call your doctor or health care professional for advice if you get a fever, chills or sore throat, or other symptoms of a cold or flu. Do not treat yourself. This drug decreases your body's ability to fight infections. Try to avoid being around people who are sick.  This medicine may increase your risk to bruise or bleed. Call your doctor or health care professional if you notice any unusual bleeding.  Be careful brushing and flossing your teeth or using a toothpick because you may get an infection or bleed more easily. If you have any dental work done, tell your dentist you are receiving this medicine.  Avoid taking products that contain aspirin, acetaminophen, ibuprofen, naproxen, or ketoprofen unless instructed by your doctor. These medicines may hide a fever.  Women should inform their doctor if they wish to become pregnant or think they might be pregnant. There is a potential for serious side effects to an unborn child. Talk to your health care professional or pharmacist for   reactions like skin rash, itching or hives, swelling of the face, lips, or tongue -low blood counts - this medicine may decrease the number of white blood cells, red blood cells and  platelets. You may be at increased risk for infections and bleeding. -signs of infection - fever or chills, cough, sore throat, pain or difficulty passing urine -signs of decreased platelets or bleeding - bruising, pinpoint red spots on the skin, black, tarry stools, blood in the urine -signs of decreased red blood cells - unusually weak or tired, fainting spells, lightheadedness -breathing problems -chest pain -mouth sores -nausea and vomiting -pain, swelling, redness at site where injected -pain, tingling, numbness in the hands or feet -stomach pain -swelling of ankles, feet, hands -unusual bleeding Side effects that usually do not require medical attention (report to your doctor or health care professional if they continue or are bothersome): -constipation -diarrhea -hair loss -loss of appetite -stomach upset This list may not describe all possible side effects. Call your doctor for medical advice about side effects. You may report side effects to FDA at 1-800-FDA-1088. Where should I keep my medicine? This drug is given in a hospital or clinic and will not be stored at home. NOTE: This sheet is a summary. It may not cover all possible information. If you have questions about this medicine, talk to your doctor, pharmacist, or health care provider.  2013, [employer]. (06/29/2007 6:45:54 PM) Nanoparticle Albumin-Bound Paclitaxel injection What is this medicine? NANOPARTICLE ALBUMIN-BOUND PACLITAXEL (Na no PAHR ti kuhl al BYOO muhn-bound PAK li TAX el) is a chemotherapy drug. It targets fast dividing cells, like cancer cells, and causes these cells to die. This medicine is used to treat advanced breast cancer and advanced lung cancer. This medicine may be used for other purposes; ask your health care provider or pharmacist if you have questions. What should I tell my health care provider before I take this medicine? They need to know if you have any of these  conditions: -kidney disease -liver disease -low blood counts, like low platelets, red blood cells, or white blood cells -recent or ongoing radiation therapy -an unusual or allergic reaction to paclitaxel, albumin, other chemotherapy, other medicines, foods, dyes, or preservatives -pregnant or trying to get pregnant -breast-feeding How should I use this medicine? This drug is given as an infusion into a vein. It is administered in a hospital or clinic by a specially trained health care professional. Talk to your pediatrician regarding the use of this medicine in children. Special care may be needed. Overdosage: If you think you have taken too much of this medicine contact a poison control center or emergency room at once. NOTE: This medicine is only for you. Do not share this medicine with others. What if I miss a dose? It is important not to miss your dose. Call your doctor or health care professional if you are unable to keep an appointment. What may interact with this medicine? This medicine may also interact with the following medications: -cyclosporine -dexamethasone -diazepam -ketoconazole -medicines to increase blood counts like filgrastim, pegfilgrastim, sargramostim -other chemotherapy drugs like cisplatin, doxorubicin, epirubicin, etoposide, teniposide, vincristine -quinidine -testosterone -vaccines -verapamil Talk to your doctor or health care professional before taking any of these medicines: -acetaminophen -aspirin -ibuprofen -ketoprofen -naproxen This list may not describe all possible interactions. Give your health care provider a list of all the medicines, herbs, non-prescription drugs, or dietary supplements you use. Also tell them if you smoke, drink alcohol, or use illegal drugs.  Some items may interact with your medicine. What should I watch for while using this medicine? Your condition will be monitored carefully while you are receiving this medicine. You will  need important blood work done while you are taking this medicine. This drug may make you feel generally unwell. This is not uncommon, as chemotherapy can affect healthy cells as well as cancer cells. Report any side effects. Continue your course of treatment even though you feel ill unless your doctor tells you to stop. In some cases, you may be given additional medicines to help with side effects. Follow all directions for their use. Call your doctor or health care professional for advice if you get a fever, chills or sore throat, or other symptoms of a cold or flu. Do not treat yourself. This drug decreases your body's ability to fight infections. Try to avoid being around people who are sick. This medicine may increase your risk to bruise or bleed. Call your doctor or health care professional if you notice any unusual bleeding. Be careful brushing and flossing your teeth or using a toothpick because you may get an infection or bleed more easily. If you have any dental work done, tell your dentist you are receiving this medicine. Avoid taking products that contain aspirin, acetaminophen, ibuprofen, naproxen, or ketoprofen unless instructed by your doctor. These medicines may hide a fever. Do not become pregnant while taking this medicine. Women should inform their doctor if they wish to become pregnant or think they might be pregnant. There is a potential for serious side effects to an unborn child. Talk to your health care professional or pharmacist for more information. Do not breast-feed an infant while taking this medicine. Men are advised not to father a child while receiving this medicine. What side effects may I notice from receiving this medicine? Side effects that you should report to your doctor or health care professional as soon as possible: -allergic reactions like skin rash, itching or hives, swelling of the face, lips, or tongue -low blood counts - This drug may decrease the number of  white blood cells, red blood cells and platelets. You may be at increased risk for infections and bleeding. -signs of infection - fever or chills, cough, sore throat, pain or difficulty passing urine -signs of decreased platelets or bleeding - bruising, pinpoint red spots on the skin, black, tarry stools, nosebleeds -signs of decreased red blood cells - unusually weak or tired, fainting spells, lightheadedness -breathing problems -changes in vision -chest pain -high or low blood pressure -mouth sores -nausea and vomiting -pain, swelling, redness or irritation at the injection site -pain, tingling, numbness in the hands or feet -slow or irregular heartbeat -swelling of the ankle, feet, hands Side effects that usually do not require medical attention (report to your doctor or health care professional if they continue or are bothersome): -aches, pains -changes in the color of fingernails -diarrhea -hair loss -loss of appetite This list may not describe all possible side effects. Call your doctor for medical advice about side effects. You may report side effects to FDA at 1-800-FDA-1088. Where should I keep my medicine? This drug is given in a hospital or clinic and will not be stored at home. NOTE: This sheet is a summary. It may not cover all possible information. If you have questions about this medicine, talk to your doctor, pharmacist, or health care provider.  2013, [employer] Standard. (12/23/2010 4:13:49 PM) [facility] Discharge Instructions for Patients Receiving Chemotherapy  Today you received the following chemotherapy agents :  Abraxane,  Gemcitabine.  To help prevent nausea and vomiting after your treatment, we encourage you to take your nausea medication as instructed by your physician.    If you develop nausea and vomiting that is not controlled by your nausea medication, call the clinic. If it is after clinic hours your family physician or the after  hours number for the clinic or go to the Emergency Department.   BELOW ARE SYMPTOMS THAT SHOULD BE REPORTED IMMEDIATELY:  *FEVER GREATER THAN 100.5 F  *CHILLS WITH OR WITHOUT FEVER  NAUSEA AND VOMITING THAT IS NOT CONTROLLED WITH YOUR NAUSEA MEDICATION  *UNUSUAL SHORTNESS OF BREATH  *UNUSUAL BRUISING OR BLEEDING  TENDERNESS IN MOUTH AND THROAT WITH OR WITHOUT PRESENCE OF ULCERS  *URINARY PROBLEMS  *BOWEL PROBLEMS  UNUSUAL RASH Items with * indicate a potential emergency and should be followed up as soon as possible.  One of the nurses will contact you 24 hours after your treatment. Please let the nurse know about any problems that you may have experienced. Feel free to call the clinic you have any questions or concerns. The clinic phone number is [phone].   I have been informed and understand all the instructions given to me. I know to contact the clinic, my physician, or go to the Emergency Department if any problems should occur. I do not have any questions at this time, but understand that I may call the clinic during office hours   should I have any questions or need assistance in obtaining follow up care.    __________________________________________  _____________  __________ Signature of Patient or Authorized Representative            Date                   Time    __________________________________________ Nurse's Signature

## 2012-07-22 ENCOUNTER — Encounter: Payer: Self-pay | Admitting: Oncology

## 2012-07-22 ENCOUNTER — Encounter (HOSPITAL_COMMUNITY)
Admission: RE | Admit: 2012-07-22 | Discharge: 2012-07-22 | Disposition: A | Discharge status: Home / Self Care | Payer: Medicare Other | Source: Ambulatory Visit | Attending: Cardiology | Admitting: Cardiology

## 2012-07-22 ENCOUNTER — Telehealth: Payer: Self-pay | Admitting: *Deleted

## 2012-07-22 DIAGNOSIS — I2510 Atherosclerotic heart disease of native coronary artery without angina pectoris: Secondary | ICD-10-CM | POA: Insufficient documentation

## 2012-07-22 DIAGNOSIS — Z5189 Encounter for other specified aftercare: Secondary | ICD-10-CM | POA: Insufficient documentation

## 2012-07-22 DIAGNOSIS — I252 Old myocardial infarction: Secondary | ICD-10-CM | POA: Insufficient documentation

## 2012-07-22 DIAGNOSIS — Z8249 Family history of ischemic heart disease and other diseases of the circulatory system: Secondary | ICD-10-CM | POA: Insufficient documentation

## 2012-07-22 NOTE — Telephone Encounter (Signed)
Message copied by Augusto Garbe on Thu Jul 22, 2012  5:30 PM ------      Message from: Normajean Baxter LE      Created: Wed Jul 21, 2012  5:13 PM      Regarding: Chemo f/u call       First time Gemzar, Abraxane,  Dr. Truett Perna, TB, RN. ------

## 2012-07-22 NOTE — Progress Notes (Signed)
Put daughter's fmla form on nurse's desk. °

## 2012-07-22 NOTE — Telephone Encounter (Signed)
Patient is doing well.  States he has not had any pain and needed a pain pill so far today.  He is eating a drinking well.  Denies any new symptoms or side effects.  Denies questions.  Asked that he call if needed.

## 2012-07-22 NOTE — Progress Notes (Signed)
Cardiac Rehab Medication Review by a Pharmacist  Does the patient  feel that his/her medications are working for him/her?  yes  Has the patient been experiencing any side effects to the medications prescribed?  No. Just some constipation.   Does the patient measure his/her own blood pressure or blood glucose at home?  no   Does the patient have any problems obtaining medications due to transportation or finances?   no  Understanding of regimen: fair Understanding of indications: good Potential of compliance: excellent   Franchot Erichsen 07/22/2012 9:05 AM

## 2012-07-23 ENCOUNTER — Telehealth: Payer: Self-pay | Admitting: *Deleted

## 2012-07-23 ENCOUNTER — Encounter: Payer: Self-pay | Admitting: Oncology

## 2012-07-23 NOTE — Telephone Encounter (Signed)
Per staff message I have tried to adjust the appt for 6/4. No where to move appt, sent scheduler message back.   JMW

## 2012-07-28 ENCOUNTER — Other Ambulatory Visit (HOSPITAL_BASED_OUTPATIENT_CLINIC_OR_DEPARTMENT_OTHER): Payer: Medicare Other | Admitting: Lab

## 2012-07-28 ENCOUNTER — Telehealth: Payer: Self-pay | Admitting: Dietician

## 2012-07-28 ENCOUNTER — Encounter: Payer: Self-pay | Admitting: Oncology

## 2012-07-28 ENCOUNTER — Ambulatory Visit (HOSPITAL_BASED_OUTPATIENT_CLINIC_OR_DEPARTMENT_OTHER): Payer: Medicare Other

## 2012-07-28 VITALS — BP 100/55 | HR 73 | Temp 98.3°F | Resp 18

## 2012-07-28 DIAGNOSIS — C787 Secondary malignant neoplasm of liver and intrahepatic bile duct: Secondary | ICD-10-CM

## 2012-07-28 DIAGNOSIS — C259 Malignant neoplasm of pancreas, unspecified: Secondary | ICD-10-CM

## 2012-07-28 DIAGNOSIS — Z5111 Encounter for antineoplastic chemotherapy: Secondary | ICD-10-CM

## 2012-07-28 LAB — CBC WITH DIFFERENTIAL/PLATELET
Basophils Absolute: 0 10*3/uL (ref 0.0–0.1)
Eosinophils Absolute: 0.2 10*3/uL (ref 0.0–0.5)
HCT: 29.6 % — ABNORMAL LOW (ref 38.4–49.9)
HGB: 9.9 g/dL — ABNORMAL LOW (ref 13.0–17.1)
LYMPH%: 14.3 % (ref 14.0–49.0)
MCV: 85.2 fL (ref 79.3–98.0)
MONO#: 0.5 10*3/uL (ref 0.1–0.9)
MONO%: 11.1 % (ref 0.0–14.0)
NEUT#: 3 10*3/uL (ref 1.5–6.5)
Platelets: 163 10*3/uL (ref 140–400)
RBC: 3.48 10*6/uL — ABNORMAL LOW (ref 4.20–5.82)
WBC: 4.2 10*3/uL (ref 4.0–10.3)

## 2012-07-28 MED ORDER — SODIUM CHLORIDE 0.9 % IV SOLN
Freq: Once | INTRAVENOUS | Status: AC
  Administered 2012-07-28: 10:00:00 via INTRAVENOUS

## 2012-07-28 MED ORDER — ONDANSETRON 8 MG/50ML IVPB (CHCC)
8.0000 mg | Freq: Once | INTRAVENOUS | Status: AC
  Administered 2012-07-28: 8 mg via INTRAVENOUS

## 2012-07-28 MED ORDER — PACLITAXEL PROTEIN-BOUND CHEMO INJECTION 100 MG
100.0000 mg/m2 | Freq: Once | INTRAVENOUS | Status: AC
  Administered 2012-07-28: 200 mg via INTRAVENOUS
  Filled 2012-07-28: qty 40

## 2012-07-28 MED ORDER — SODIUM CHLORIDE 0.9 % IJ SOLN
10.0000 mL | INTRAMUSCULAR | Status: DC | PRN
  Administered 2012-07-28: 10 mL
  Filled 2012-07-28: qty 10

## 2012-07-28 MED ORDER — SODIUM CHLORIDE 0.9 % IV SOLN
800.0000 mg/m2 | Freq: Once | INTRAVENOUS | Status: AC
  Administered 2012-07-28: 1520 mg via INTRAVENOUS
  Filled 2012-07-28: qty 40

## 2012-07-28 MED ORDER — DEXAMETHASONE SODIUM PHOSPHATE 10 MG/ML IJ SOLN
10.0000 mg | Freq: Once | INTRAMUSCULAR | Status: AC
  Administered 2012-07-28: 10 mg via INTRAVENOUS

## 2012-07-28 MED ORDER — HEPARIN SOD (PORK) LOCK FLUSH 100 UNIT/ML IV SOLN
500.0000 [IU] | Freq: Once | INTRAVENOUS | Status: AC | PRN
  Administered 2012-07-28: 500 [IU]
  Filled 2012-07-28: qty 5

## 2012-07-28 NOTE — Telephone Encounter (Signed)
Brief Outpatient Oncology Nutrition Note  Patient has been identified to be at risk on malnutrition screen.  Wt Readings from Last 10 Encounters:  07/22/12 161 lb 9.6 oz (73.3 kg)  07/21/12 161 lb 14.4 oz (73.437 kg)  07/13/12 165 lb (74.844 kg)  07/09/12 166 lb (75.297 kg)  07/07/12 166 lb (75.297 kg)  07/07/12 165 lb 4 oz (74.957 kg)  07/01/12 174 lb (78.926 kg)  06/30/12 174 lb (78.926 kg)  06/22/12 174 lb (78.926 kg)  06/16/12 173 lb 15.1 oz (78.9 kg)    Patient with continued weight loss.  Seen by inpatient RD 5/14.  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  Called and left message with [facility].  Will also have an appointment made for him with the [facility] RD.  Oran Rein, RD, LDN

## 2012-07-28 NOTE — Patient Instructions (Signed)
 [facility] Discharge Instructions for Patients Receiving Chemotherapy  Today you received the following chemotherapy agents Abraxane/Gemzar To help prevent nausea and vomiting after your treatment, we encourage you to take your nausea medication as prescribed.  If you develop nausea and vomiting that is not controlled by your nausea medication, call the clinic. If it is after clinic hours your family physician or the after hours number for the clinic or go to the Emergency Department.   BELOW ARE SYMPTOMS THAT SHOULD BE REPORTED IMMEDIATELY:  *FEVER GREATER THAN 100.5 F  *CHILLS WITH OR WITHOUT FEVER  NAUSEA AND VOMITING THAT IS NOT CONTROLLED WITH YOUR NAUSEA MEDICATION  *UNUSUAL SHORTNESS OF BREATH  *UNUSUAL BRUISING OR BLEEDING  TENDERNESS IN MOUTH AND THROAT WITH OR WITHOUT PRESENCE OF ULCERS  *URINARY PROBLEMS  *BOWEL PROBLEMS  UNUSUAL RASH Items with * indicate a potential emergency and should be followed up as soon as possible.  Feel free to call the clinic you have any questions or concerns. The clinic phone number is [phone].   I have been informed and understand all the instructions given to me. I know to contact the clinic, my physician, or go to the Emergency Department if any problems should occur. I do not have any questions at this time, but understand that I may call the clinic during office hours   should I have any questions or need assistance in obtaining follow up care.    __________________________________________  _____________  __________ Signature of Patient or Authorized Representative            Date                   Time    __________________________________________ Nurse's Signature

## 2012-07-28 NOTE — Progress Notes (Signed)
Faxed fmla form to [employer] @ [phone].

## 2012-07-30 ENCOUNTER — Encounter (HOSPITAL_COMMUNITY)
Admission: RE | Admit: 2012-07-30 | Discharge: 2012-07-30 | Disposition: A | Discharge status: Home / Self Care | Payer: Medicare Other | Source: Ambulatory Visit | Attending: Cardiology | Admitting: Cardiology

## 2012-07-30 NOTE — Progress Notes (Signed)
Pt in today for his first cardiac rehab exercise session at 1115. Pt tolerated light exercise with no difficulties.  Pt had chemotherapy treatment on 5/21 and reports no side effect symptoms.  Monitor shows SR with pvc's noted occasional in nature.  Will send to Dr. Sharyn Lull for review and advisement.  PHQ2 score 1.  Continue to monitor.

## 2012-08-01 ENCOUNTER — Other Ambulatory Visit: Payer: Self-pay | Admitting: Oncology

## 2012-08-02 ENCOUNTER — Telehealth: Payer: Self-pay | Admitting: Dietician

## 2012-08-02 ENCOUNTER — Encounter (HOSPITAL_COMMUNITY)
Admission: RE | Admit: 2012-08-02 | Discharge: 2012-08-02 | Disposition: A | Discharge status: Home / Self Care | Payer: Medicare Other | Source: Ambulatory Visit | Attending: Cardiology | Admitting: Cardiology

## 2012-08-02 DIAGNOSIS — I2510 Atherosclerotic heart disease of native coronary artery without angina pectoris: Secondary | ICD-10-CM | POA: Insufficient documentation

## 2012-08-02 DIAGNOSIS — Z5189 Encounter for other specified aftercare: Secondary | ICD-10-CM | POA: Insufficient documentation

## 2012-08-02 DIAGNOSIS — I252 Old myocardial infarction: Secondary | ICD-10-CM | POA: Insufficient documentation

## 2012-08-02 DIAGNOSIS — Z8249 Family history of ischemic heart disease and other diseases of the circulatory system: Secondary | ICD-10-CM | POA: Insufficient documentation

## 2012-08-04 ENCOUNTER — Ambulatory Visit: Payer: Medicare Other | Admitting: Nutrition

## 2012-08-04 ENCOUNTER — Institutional Professional Consult (permissible substitution): Payer: Self-pay | Admitting: Cardiology

## 2012-08-04 ENCOUNTER — Telehealth: Payer: Self-pay | Admitting: *Deleted

## 2012-08-04 ENCOUNTER — Other Ambulatory Visit (HOSPITAL_BASED_OUTPATIENT_CLINIC_OR_DEPARTMENT_OTHER): Payer: Medicare Other | Admitting: Lab

## 2012-08-04 ENCOUNTER — Ambulatory Visit (HOSPITAL_BASED_OUTPATIENT_CLINIC_OR_DEPARTMENT_OTHER): Payer: 59 | Admitting: Oncology

## 2012-08-04 ENCOUNTER — Telehealth: Payer: Self-pay | Admitting: Oncology

## 2012-08-04 ENCOUNTER — Other Ambulatory Visit: Payer: Self-pay | Admitting: Lab

## 2012-08-04 ENCOUNTER — Encounter: Payer: Self-pay | Admitting: Oncology

## 2012-08-04 ENCOUNTER — Ambulatory Visit (HOSPITAL_BASED_OUTPATIENT_CLINIC_OR_DEPARTMENT_OTHER): Payer: 59

## 2012-08-04 VITALS — BP 108/63 | HR 76 | Temp 98.2°F | Resp 18 | Ht 69.0 in | Wt 156.5 lb

## 2012-08-04 DIAGNOSIS — Z5111 Encounter for antineoplastic chemotherapy: Secondary | ICD-10-CM

## 2012-08-04 DIAGNOSIS — C787 Secondary malignant neoplasm of liver and intrahepatic bile duct: Secondary | ICD-10-CM

## 2012-08-04 DIAGNOSIS — C259 Malignant neoplasm of pancreas, unspecified: Secondary | ICD-10-CM

## 2012-08-04 LAB — CBC WITH DIFFERENTIAL/PLATELET
BASO%: 0.4 % (ref 0.0–2.0)
Eosinophils Absolute: 0.1 10*3/uL (ref 0.0–0.5)
HCT: 28.4 % — ABNORMAL LOW (ref 38.4–49.9)
LYMPH%: 10.4 % — ABNORMAL LOW (ref 14.0–49.0)
MCHC: 34.6 g/dL (ref 32.0–36.0)
MCV: 84.2 fL (ref 79.3–98.0)
MONO#: 0.4 10*3/uL (ref 0.1–0.9)
MONO%: 14.3 % — ABNORMAL HIGH (ref 0.0–14.0)
NEUT%: 71.4 % (ref 39.0–75.0)
Platelets: 134 10*3/uL — ABNORMAL LOW (ref 140–400)
WBC: 2.8 10*3/uL — ABNORMAL LOW (ref 4.0–10.3)

## 2012-08-04 LAB — COMPREHENSIVE METABOLIC PANEL (CC13)
Alkaline Phosphatase: 183 U/L — ABNORMAL HIGH (ref 40–150)
CO2: 25 mEq/L (ref 22–29)
Creatinine: 0.8 mg/dL (ref 0.7–1.3)
Glucose: 137 mg/dl — ABNORMAL HIGH (ref 70–99)
Total Bilirubin: 0.78 mg/dL (ref 0.20–1.20)

## 2012-08-04 MED ORDER — SODIUM CHLORIDE 0.9 % IV SOLN
Freq: Once | INTRAVENOUS | Status: AC
  Administered 2012-08-04: 11:00:00 via INTRAVENOUS

## 2012-08-04 MED ORDER — DEXAMETHASONE SODIUM PHOSPHATE 10 MG/ML IJ SOLN
10.0000 mg | Freq: Once | INTRAMUSCULAR | Status: AC
  Administered 2012-08-04: 10 mg via INTRAVENOUS

## 2012-08-04 MED ORDER — SODIUM CHLORIDE 0.9 % IJ SOLN
10.0000 mL | INTRAMUSCULAR | Status: DC | PRN
  Administered 2012-08-04: 10 mL
  Filled 2012-08-04: qty 10

## 2012-08-04 MED ORDER — PACLITAXEL PROTEIN-BOUND CHEMO INJECTION 100 MG
100.0000 mg/m2 | Freq: Once | INTRAVENOUS | Status: AC
  Administered 2012-08-04: 200 mg via INTRAVENOUS
  Filled 2012-08-04: qty 40

## 2012-08-04 MED ORDER — ONDANSETRON 8 MG/50ML IVPB (CHCC)
8.0000 mg | Freq: Once | INTRAVENOUS | Status: AC
  Administered 2012-08-04: 8 mg via INTRAVENOUS

## 2012-08-04 MED ORDER — HEPARIN SOD (PORK) LOCK FLUSH 100 UNIT/ML IV SOLN
500.0000 [IU] | Freq: Once | INTRAVENOUS | Status: AC | PRN
  Administered 2012-08-04: 500 [IU]
  Filled 2012-08-04: qty 5

## 2012-08-04 MED ORDER — SODIUM CHLORIDE 0.9 % IV SOLN
800.0000 mg/m2 | Freq: Once | INTRAVENOUS | Status: AC
  Administered 2012-08-04: 1520 mg via INTRAVENOUS
  Filled 2012-08-04: qty 40

## 2012-08-04 NOTE — Telephone Encounter (Signed)
Pt scheduled today for nutrition consult for today, called Britta Mccreedy

## 2012-08-04 NOTE — Telephone Encounter (Signed)
Per staff message and POF I have scheduled appts.  JMW  

## 2012-08-04 NOTE — Progress Notes (Signed)
   [facility]    OFFICE PROGRESS NOTE   INTERVAL HISTORY:   He returns as scheduled. He has completed 2 treatments with gemcitabine/Abraxane. No nausea or neuropathy symptoms. No fever. He continues to have intermittent abdominal pain. The pain is relieved with one or 2 hydrocodone tablets per day. No chest pain. He is participating in a cardiac rehabilitation program.  Objective:  Vital signs in last 24 hours:  Blood pressure 108/63, pulse 76, temperature 98.2 F (36.8 C), temperature source Oral, resp. rate 18, height 5\' 9"  (1.753 m), weight 156 lb 8 oz (70.988 kg).    HEENT: no thrush or ulcers Resp: lungs clear bilaterally Cardio: regular rate and rhythm GI: no hepatomegaly, nontender, no mass Vascular: no leg edema   Portacath/PICC-without erythema  Lab Results:  Lab Results  Component Value Date   WBC 2.8* 08/04/2012   HGB 9.8* 08/04/2012   HCT 28.4* 08/04/2012   MCV 84.2 08/04/2012   PLT 134* 08/04/2012  ANC 2.0    Medications: I have reviewed the patient's current medications.  Assessment/Plan: 1.Metastatic pancreas cancer-the clinical presentation, elevated CA 19-9, and abdomen CT are consistent with a diagnosis of metastatic pancreas cancer . 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  -initiation of gemcitabine/Abraxane on 07/21/2012 2. Pain secondary to #1-currently relieved with hydrocodone  3. Acute myocardial infarction 06/16/2012-status post PTCA/drug-eluting stent , maintained on Brilinta  4. Status post Port-A-Cath placement 07/16/2012    Disposition:  He tolerated the first 2 weeks of gemcitabine/Abraxane without significant acute toxicity. The plan is to proceed with week 3 today. He will return to begin cycle 2 on 08/18/2012. Mr. Loescher is scheduled for an office visit on 09/01/2012.   Thornton Papas, MD  08/04/2012  2:51 PM

## 2012-08-04 NOTE — Progress Notes (Signed)
I received a request from nursing to speak with patient today. Patient had a nutrition appointment scheduled however he refuses to stay past his scheduled chemotherapy treatment for the nutrition consult. I offered to call patient at home. I did call patient and spoke with him briefly. Patient did not want to speak in detail on the phone. He states he is at the drug store. He refused my offer of calling him back several other times this week because they did not fit into his schedule. He reports he is not even sure why he needs to speak with me other than he has lost weight and the doctors are concerned. Of note, patient has had several nutrition conversations with other registered dietitians within the health system. I will put together patient education and information for him and put in the mail to him with my contact numbers. Patient will call me if he has questions or concerns.

## 2012-08-04 NOTE — Progress Notes (Signed)
Met with patient to assess for needs.  He has an appointment with dietician today, but does not want to stay after treatment for the appointment.  This RN spoke with  Dietician Vernell Leep who is aware and will call the patient at home today.  The patient was agreeable to this and expressed appreciation.  The patient denies any other needs or barriers to care at this time.

## 2012-08-04 NOTE — Telephone Encounter (Signed)
appts made and printed. Pt is aware that tx will be added for 6/18, 6/25, and 7/2. i emailed MW to add those tx....td

## 2012-08-04 NOTE — Telephone Encounter (Signed)
sw pt gv appt d/t for tx. 08/18/12@ 9:45am, 08/25/12@ 2pm, and 09/01/12@ 10am...td

## 2012-08-04 NOTE — Patient Instructions (Addendum)
[facility] Discharge Instructions for Patients Receiving Chemotherapy  Today you received the following chemotherapy agents : Gemzar/abraxane  To help prevent nausea and vomiting after your treatment, we encourage you to take your nausea medication.  If you develop nausea and vomiting that is not controlled by your nausea medication, call the clinic.   BELOW ARE SYMPTOMS THAT SHOULD BE REPORTED IMMEDIATELY:  *FEVER GREATER THAN 100.5 F  *CHILLS WITH OR WITHOUT FEVER  NAUSEA AND VOMITING THAT IS NOT CONTROLLED WITH YOUR NAUSEA MEDICATION  *UNUSUAL SHORTNESS OF BREATH  *UNUSUAL BRUISING OR BLEEDING  TENDERNESS IN MOUTH AND THROAT WITH OR WITHOUT PRESENCE OF ULCERS  *URINARY PROBLEMS  *BOWEL PROBLEMS  UNUSUAL RASH Items with * indicate a potential emergency and should be followed up as soon as possible.  Feel free to call the clinic you have any questions or concerns. The clinic phone number is [phone].

## 2012-08-05 ENCOUNTER — Ambulatory Visit: Payer: Self-pay | Admitting: Oncology

## 2012-08-06 ENCOUNTER — Encounter (HOSPITAL_COMMUNITY)
Admission: RE | Admit: 2012-08-06 | Discharge: 2012-08-06 | Disposition: A | Discharge status: Home / Self Care | Payer: Medicare Other | Source: Ambulatory Visit | Attending: Cardiology | Admitting: Cardiology

## 2012-08-06 NOTE — Progress Notes (Signed)
Reviewed home exercise with pt today.  Pt plans to continue walking at home for exercise.  Reviewed THR, pulse, RPE, sign and symptoms, NTG use, and when to call 911 or MD.  Pt voiced understanding. Jefry Lesinski, MA, ACSM RCEP  

## 2012-08-09 ENCOUNTER — Telehealth: Payer: Self-pay | Admitting: Oncology

## 2012-08-09 ENCOUNTER — Ambulatory Visit: Payer: Medicare Other | Admitting: Internal Medicine

## 2012-08-09 ENCOUNTER — Encounter (HOSPITAL_COMMUNITY)
Admission: RE | Admit: 2012-08-09 | Discharge: 2012-08-09 | Disposition: A | Discharge status: Home / Self Care | Payer: Medicare Other | Source: Ambulatory Visit | Attending: Cardiology | Admitting: Cardiology

## 2012-08-09 NOTE — Telephone Encounter (Signed)
Called pt and left a message regarding labs abd chemo for 6/18 and 6/25

## 2012-08-10 ENCOUNTER — Encounter: Payer: Self-pay | Admitting: Internal Medicine

## 2012-08-10 DIAGNOSIS — F329 Major depressive disorder, single episode, unspecified: Secondary | ICD-10-CM

## 2012-08-11 ENCOUNTER — Inpatient Hospital Stay (HOSPITAL_COMMUNITY)
Admission: EM | Admit: 2012-08-11 | Discharge: 2012-08-18 | DRG: 193 | Disposition: A | Discharge status: Home / Self Care | Payer: Medicare Other | Attending: Cardiology | Admitting: Cardiology

## 2012-08-11 ENCOUNTER — Other Ambulatory Visit: Payer: Self-pay | Admitting: Lab

## 2012-08-11 ENCOUNTER — Emergency Department (HOSPITAL_COMMUNITY): Payer: Medicare Other

## 2012-08-11 ENCOUNTER — Encounter (HOSPITAL_COMMUNITY): Payer: Self-pay | Admitting: *Deleted

## 2012-08-11 ENCOUNTER — Ambulatory Visit: Payer: Self-pay | Admitting: Oncology

## 2012-08-11 ENCOUNTER — Telehealth: Payer: Self-pay | Admitting: Dietician

## 2012-08-11 DIAGNOSIS — F3289 Other specified depressive episodes: Secondary | ICD-10-CM | POA: Diagnosis present

## 2012-08-11 DIAGNOSIS — I509 Heart failure, unspecified: Secondary | ICD-10-CM | POA: Diagnosis present

## 2012-08-11 DIAGNOSIS — E8779 Other fluid overload: Secondary | ICD-10-CM | POA: Diagnosis present

## 2012-08-11 DIAGNOSIS — J984 Other disorders of lung: Secondary | ICD-10-CM

## 2012-08-11 DIAGNOSIS — C787 Secondary malignant neoplasm of liver and intrahepatic bile duct: Secondary | ICD-10-CM | POA: Diagnosis present

## 2012-08-11 DIAGNOSIS — I454 Nonspecific intraventricular block: Secondary | ICD-10-CM | POA: Diagnosis present

## 2012-08-11 DIAGNOSIS — Z9861 Coronary angioplasty status: Secondary | ICD-10-CM

## 2012-08-11 DIAGNOSIS — T451X5A Adverse effect of antineoplastic and immunosuppressive drugs, initial encounter: Secondary | ICD-10-CM | POA: Diagnosis present

## 2012-08-11 DIAGNOSIS — R509 Fever, unspecified: Secondary | ICD-10-CM

## 2012-08-11 DIAGNOSIS — I10 Essential (primary) hypertension: Secondary | ICD-10-CM | POA: Diagnosis present

## 2012-08-11 DIAGNOSIS — I252 Old myocardial infarction: Secondary | ICD-10-CM

## 2012-08-11 DIAGNOSIS — I2510 Atherosclerotic heart disease of native coronary artery without angina pectoris: Secondary | ICD-10-CM | POA: Diagnosis present

## 2012-08-11 DIAGNOSIS — D6481 Anemia due to antineoplastic chemotherapy: Secondary | ICD-10-CM | POA: Diagnosis present

## 2012-08-11 DIAGNOSIS — C259 Malignant neoplasm of pancreas, unspecified: Secondary | ICD-10-CM

## 2012-08-11 DIAGNOSIS — Z87891 Personal history of nicotine dependence: Secondary | ICD-10-CM

## 2012-08-11 DIAGNOSIS — R918 Other nonspecific abnormal finding of lung field: Secondary | ICD-10-CM | POA: Diagnosis present

## 2012-08-11 DIAGNOSIS — F411 Generalized anxiety disorder: Secondary | ICD-10-CM | POA: Diagnosis present

## 2012-08-11 DIAGNOSIS — D630 Anemia in neoplastic disease: Secondary | ICD-10-CM | POA: Diagnosis present

## 2012-08-11 DIAGNOSIS — J9601 Acute respiratory failure with hypoxia: Secondary | ICD-10-CM

## 2012-08-11 DIAGNOSIS — Z7982 Long term (current) use of aspirin: Secondary | ICD-10-CM

## 2012-08-11 DIAGNOSIS — R0902 Hypoxemia: Secondary | ICD-10-CM

## 2012-08-11 DIAGNOSIS — E43 Unspecified severe protein-calorie malnutrition: Secondary | ICD-10-CM | POA: Insufficient documentation

## 2012-08-11 DIAGNOSIS — Z8249 Family history of ischemic heart disease and other diseases of the circulatory system: Secondary | ICD-10-CM

## 2012-08-11 DIAGNOSIS — IMO0002 Reserved for concepts with insufficient information to code with codable children: Secondary | ICD-10-CM

## 2012-08-11 DIAGNOSIS — Z79899 Other long term (current) drug therapy: Secondary | ICD-10-CM

## 2012-08-11 DIAGNOSIS — J9600 Acute respiratory failure, unspecified whether with hypoxia or hypercapnia: Secondary | ICD-10-CM | POA: Diagnosis present

## 2012-08-11 DIAGNOSIS — F329 Major depressive disorder, single episode, unspecified: Secondary | ICD-10-CM | POA: Diagnosis present

## 2012-08-11 DIAGNOSIS — E7800 Pure hypercholesterolemia, unspecified: Secondary | ICD-10-CM | POA: Diagnosis present

## 2012-08-11 DIAGNOSIS — J189 Pneumonia, unspecified organism: Principal | ICD-10-CM | POA: Diagnosis present

## 2012-08-11 LAB — CBC WITH DIFFERENTIAL/PLATELET
Eosinophils Relative: 3 % (ref 0–5)
HCT: 24.3 % — ABNORMAL LOW (ref 39.0–52.0)
Lymphocytes Relative: 12 % (ref 12–46)
Lymphs Abs: 0.3 10*3/uL — ABNORMAL LOW (ref 0.7–4.0)
MCV: 82.7 fL (ref 78.0–100.0)
Monocytes Absolute: 0.5 10*3/uL (ref 0.1–1.0)
Monocytes Relative: 20 % — ABNORMAL HIGH (ref 3–12)
RBC: 2.94 MIL/uL — ABNORMAL LOW (ref 4.22–5.81)
WBC: 2.3 10*3/uL — ABNORMAL LOW (ref 4.0–10.5)

## 2012-08-11 LAB — BASIC METABOLIC PANEL
BUN: 17 mg/dL (ref 6–23)
CO2: 23 mEq/L (ref 19–32)
Calcium: 8.6 mg/dL (ref 8.4–10.5)
Creatinine, Ser: 0.78 mg/dL (ref 0.50–1.35)
Glucose, Bld: 123 mg/dL — ABNORMAL HIGH (ref 70–99)

## 2012-08-11 LAB — PROTIME-INR: INR: 1.33 (ref 0.00–1.49)

## 2012-08-11 LAB — URINALYSIS, ROUTINE W REFLEX MICROSCOPIC
Bilirubin Urine: NEGATIVE
Leukocytes, UA: NEGATIVE
Nitrite: NEGATIVE
Specific Gravity, Urine: 1.022 (ref 1.005–1.030)
Urobilinogen, UA: 1 mg/dL (ref 0.0–1.0)

## 2012-08-11 LAB — APTT: aPTT: 44 seconds — ABNORMAL HIGH (ref 24–37)

## 2012-08-11 LAB — HIV ANTIBODY (ROUTINE TESTING W REFLEX): HIV: NONREACTIVE

## 2012-08-11 MED ORDER — ASPIRIN EC 81 MG PO TBEC
81.0000 mg | DELAYED_RELEASE_TABLET | Freq: Every day | ORAL | Status: DC
  Administered 2012-08-11 – 2012-08-18 (×8): 81 mg via ORAL
  Filled 2012-08-11 (×8): qty 1

## 2012-08-11 MED ORDER — RAMIPRIL 1.25 MG PO CAPS
1.2500 mg | ORAL_CAPSULE | Freq: Every day | ORAL | Status: DC
  Administered 2012-08-11 – 2012-08-16 (×6): 1.25 mg via ORAL
  Filled 2012-08-11 (×6): qty 1

## 2012-08-11 MED ORDER — PIPERACILLIN-TAZOBACTAM 3.375 G IVPB 30 MIN
3.3750 g | Freq: Once | INTRAVENOUS | Status: AC
  Administered 2012-08-11: 3.375 g via INTRAVENOUS
  Filled 2012-08-11: qty 50

## 2012-08-11 MED ORDER — LEVOFLOXACIN IN D5W 500 MG/100ML IV SOLN
500.0000 mg | Freq: Once | INTRAVENOUS | Status: AC
  Administered 2012-08-11: 500 mg via INTRAVENOUS
  Filled 2012-08-11: qty 100

## 2012-08-11 MED ORDER — VANCOMYCIN HCL IN DEXTROSE 1-5 GM/200ML-% IV SOLN
1000.0000 mg | Freq: Two times a day (BID) | INTRAVENOUS | Status: AC
  Administered 2012-08-11 – 2012-08-13 (×4): 1000 mg via INTRAVENOUS
  Filled 2012-08-11 (×4): qty 200

## 2012-08-11 MED ORDER — ALPRAZOLAM 0.5 MG PO TABS
0.5000 mg | ORAL_TABLET | Freq: Three times a day (TID) | ORAL | Status: DC | PRN
  Administered 2012-08-11 – 2012-08-18 (×9): 0.5 mg via ORAL
  Filled 2012-08-11 (×9): qty 1

## 2012-08-11 MED ORDER — DOCUSATE SODIUM 100 MG PO CAPS
100.0000 mg | ORAL_CAPSULE | ORAL | Status: DC | PRN
  Filled 2012-08-11: qty 1

## 2012-08-11 MED ORDER — ATORVASTATIN CALCIUM 80 MG PO TABS
80.0000 mg | ORAL_TABLET | Freq: Every day | ORAL | Status: DC
  Administered 2012-08-11 – 2012-08-15 (×5): 80 mg via ORAL
  Filled 2012-08-11 (×6): qty 1

## 2012-08-11 MED ORDER — VANCOMYCIN HCL IN DEXTROSE 1-5 GM/200ML-% IV SOLN
1000.0000 mg | Freq: Once | INTRAVENOUS | Status: AC
  Administered 2012-08-11: 1000 mg via INTRAVENOUS
  Filled 2012-08-11: qty 200

## 2012-08-11 MED ORDER — CITALOPRAM HYDROBROMIDE 20 MG PO TABS
20.0000 mg | ORAL_TABLET | Freq: Every day | ORAL | Status: DC
  Administered 2012-08-11 – 2012-08-18 (×8): 20 mg via ORAL
  Filled 2012-08-11 (×8): qty 1

## 2012-08-11 MED ORDER — TICAGRELOR 90 MG PO TABS
90.0000 mg | ORAL_TABLET | Freq: Two times a day (BID) | ORAL | Status: DC
  Administered 2012-08-11 – 2012-08-18 (×16): 90 mg via ORAL
  Filled 2012-08-11 (×18): qty 1

## 2012-08-11 MED ORDER — IOHEXOL 350 MG/ML SOLN
100.0000 mL | Freq: Once | INTRAVENOUS | Status: AC | PRN
  Administered 2012-08-11: 100 mL via INTRAVENOUS

## 2012-08-11 MED ORDER — PIPERACILLIN-TAZOBACTAM 3.375 G IVPB
3.3750 g | Freq: Three times a day (TID) | INTRAVENOUS | Status: DC
  Administered 2012-08-11 – 2012-08-14 (×9): 3.375 g via INTRAVENOUS
  Filled 2012-08-11 (×10): qty 50

## 2012-08-11 MED ORDER — FERROUS SULFATE 325 (65 FE) MG PO TABS
325.0000 mg | ORAL_TABLET | Freq: Three times a day (TID) | ORAL | Status: DC
  Administered 2012-08-11 – 2012-08-18 (×22): 325 mg via ORAL
  Filled 2012-08-11 (×23): qty 1

## 2012-08-11 MED ORDER — HYDROCODONE-ACETAMINOPHEN 5-325 MG PO TABS
1.0000 | ORAL_TABLET | Freq: Four times a day (QID) | ORAL | Status: DC | PRN
  Administered 2012-08-11 – 2012-08-17 (×9): 1 via ORAL
  Filled 2012-08-11 (×9): qty 1

## 2012-08-11 MED ORDER — HEPARIN SODIUM (PORCINE) 5000 UNIT/ML IJ SOLN
5000.0000 [IU] | Freq: Three times a day (TID) | INTRAMUSCULAR | Status: DC
  Administered 2012-08-11 – 2012-08-16 (×14): 5000 [IU] via SUBCUTANEOUS
  Filled 2012-08-11 (×18): qty 1

## 2012-08-11 MED ORDER — ACETAMINOPHEN 500 MG PO TABS: 500.0000 mg | ORAL_TABLET | Freq: Every day | ORAL | Status: DC | PRN

## 2012-08-11 MED ORDER — CITALOPRAM HYDROBROMIDE 20 MG PO TABS: 20.0000 mg | ORAL_TABLET | Freq: Every day | ORAL | Status: DC

## 2012-08-11 MED ORDER — NITROGLYCERIN 0.4 MG SL SUBL: 0.4000 mg | SUBLINGUAL_TABLET | SUBLINGUAL | Status: DC | PRN

## 2012-08-11 MED ORDER — SODIUM CHLORIDE 0.9 % IV SOLN
INTRAVENOUS | Status: AC
  Administered 2012-08-11: 10:00:00 via INTRAVENOUS

## 2012-08-11 MED ORDER — LEVALBUTEROL HCL 1.25 MG/0.5ML IN NEBU
1.2500 mg | INHALATION_SOLUTION | Freq: Three times a day (TID) | RESPIRATORY_TRACT | Status: DC
  Administered 2012-08-11 – 2012-08-14 (×8): 1.25 mg via RESPIRATORY_TRACT
  Filled 2012-08-11 (×13): qty 0.5

## 2012-08-11 MED ORDER — CARVEDILOL 6.25 MG PO TABS
6.2500 mg | ORAL_TABLET | Freq: Two times a day (BID) | ORAL | Status: DC
  Administered 2012-08-11 – 2012-08-14 (×8): 6.25 mg via ORAL
  Filled 2012-08-11 (×9): qty 1

## 2012-08-11 NOTE — Progress Notes (Signed)
Utilization Review completed.  Dorse Locy RN CM  

## 2012-08-11 NOTE — Progress Notes (Signed)
ANTIBIOTIC CONSULT NOTE - INITIAL  Pharmacy Consult for Vanco and Zosyn  Indication: PNA  No Known Allergies  Patient Measurements: Height: 5\' 9"  (175.3 cm) IBW/kg (Calculated) : 70.7  Vital Signs: Temp: 98.6 F (37 C) (06/11 1326) Temp src: Oral (06/11 1326) BP: 104/60 mmHg (06/11 1326) Pulse Rate: 75 (06/11 1326)  Labs:  Recent Labs  08/11/12 0617  WBC 2.3*  HGB 8.3*  PLT 180  CREATININE 0.78     Medical History: Past Medical History  Diagnosis Date  . Anxiety   . Pancreatic cancer metastasized to liver   . MI (myocardial infarction)   . HTN (hypertension)   . Right rotator cuff tear   . Diverticulosis   . Malignant neoplasm of pancreas, part unspecified 07/18/2012    Medications:  Anti-infectives   Start     Dose/Rate Route Frequency Ordered Stop   08/11/12 0930  levofloxacin (LEVAQUIN) IVPB 500 mg     500 mg 100 mL/hr over 60 Minutes Intravenous  Once 08/11/12 0916 08/11/12 1025   08/11/12 0930  piperacillin-tazobactam (ZOSYN) IVPB 3.375 g     3.375 g 100 mL/hr over 30 Minutes Intravenous  Once 08/11/12 0923 08/11/12 1053   08/11/12 0930  vancomycin (VANCOCIN) IVPB 1000 mg/200 mL premix     1,000 mg 200 mL/hr over 60 Minutes Intravenous  Once 08/11/12 1610 08/11/12 1228     Assessment: 72 yo M admitted 08/11/12 with probable B/L PNA and pancytopenia s/p chemo. Patient received Zosyn 3.375g over 30 min at 10:30am and Vanco 1g IV at 11a.m. Plan is to continue Vanco and Zosyn on admission. Wt = 71kg on 08/04/12, CrCl(n)~14ml/min.  Goal of Therapy:  Vancomycin trough level 15-20 mcg/ml  Plan:  1) Zosyn 3.375g IV q8h (4 hour infusion) 2) Vanco 1g IV q12h 3) F/U updated wt info and CrCl vs dose  Darrol Angel, PharmD Pager: [phone] 08/11/2012,1:29 PM

## 2012-08-11 NOTE — ED Notes (Signed)
Patient transported to X-ray 

## 2012-08-11 NOTE — ED Provider Notes (Signed)
History     CSN: [id number]  Arrival date & time 08/11/12  4098   First MD Initiated Contact with Patient 08/11/12 0550      Chief Complaint  Patient presents with  . Fever  . Cough    (Consider location/radiation/quality/duration/timing/severity/associated sxs/prior treatment) Patient is a 72 y.o. male presenting with fever and cough. The history is provided by the patient.  Fever Associated symptoms: cough   Cough Associated symptoms: fever   He is getting chemotherapy for pancreatic cancer and just completed his third week of Gemcitabine. This morning, his daughter noted that he felt warm and took his temperature and found it was 102.9. He did have chills and sweats during the night last night. He has had a chronic cough which is unchanged. It had been productive of a small amount of clear sputum. He denies sore throat. There's no change in his chronic abdominal pain related to his pancreatic cancer. There's been no nausea, vomiting, diarrhea. He denies any urinary urgency, frequency, and tenesmus, dysuria. He has not had any rash. He has had intermittent nosebleeds which he relates to his Ticagrelor which he takes because of having a cardiac stent.  Past Medical History  Diagnosis Date  . Anxiety   . Pancreatic cancer metastasized to liver   . MI (myocardial infarction)   . HTN (hypertension)   . Right rotator cuff tear   . Diverticulosis   . Malignant neoplasm of pancreas, part unspecified 07/18/2012    Past Surgical History  Procedure Laterality Date  . Cardiac surgery      MI with stent placement    Family History  Problem Relation Age of Onset  . Cancer Mother     oropharengial  . Heart attack Father   . Ovarian cancer Sister   . Anuerysm Brother     AAA    History  Substance Use Topics  . Smoking status: Current Some Day Smoker    Types: Cigarettes, Cigars    Last Attempt to Quit: 03/03/2005  . Smokeless tobacco: Never Used     Comment: pt quit  cigarettes in 2005 but still smoke cigars  . Alcohol Use: No     Comment: quit 06/16/2012      Review of Systems  Constitutional: Positive for fever.  Respiratory: Positive for cough.   All other systems reviewed and are negative.    Allergies  Review of patient's allergies indicates no known allergies.  Home Medications   Current Outpatient Rx  Name  Route  Sig  Dispense  Refill  . acetaminophen (TYLENOL) 500 MG tablet   Oral   Take 500-1,000 mg by mouth daily as needed for pain.         Marland Kitchen ALPRAZolam (XANAX) 0.5 MG tablet   Oral   Take 0.5 mg by mouth 3 (three) times daily as needed for anxiety.         Marland Kitchen aspirin EC 81 MG tablet   Oral   Take 81 mg by mouth daily.         Marland Kitchen atorvastatin (LIPITOR) 80 MG tablet   Oral   Take 1 tablet (80 mg total) by mouth daily at 6 PM.   30 tablet   1   . carvedilol (COREG) 6.25 MG tablet   Oral   Take 1 tablet (6.25 mg total) by mouth 2 (two) times daily with a meal.   60 tablet   1   . docusate sodium (COLACE) 100 MG capsule  Oral   Take 100 mg by mouth as needed for constipation.         Marland Kitchen GEMCITABINE HCL IV   Intravenous   Inject 1,520 mg into the vein.         Marland Kitchen HYDROcodone-acetaminophen (NORCO/VICODIN) 5-325 MG per tablet   Oral   Take 1 tablet by mouth every 6 (six) hours as needed for pain.   50 tablet   2   . lidocaine-prilocaine (EMLA) cream   Topical   Apply topically as needed. To PAC prn         . nitroGLYCERIN (NITROSTAT) 0.4 MG SL tablet   Sublingual   Place 1 tablet (0.4 mg total) under the tongue every 5 (five) minutes x 3 doses as needed for chest pain.   25 tablet   0   . prochlorperazine (COMPAZINE) 10 MG tablet   Oral   Take 1 tablet (10 mg total) by mouth every 6 (six) hours as needed (nausea).   60 tablet   1   . ramipril (ALTACE) 1.25 MG capsule   Oral   Take 1 capsule (1.25 mg total) by mouth daily.   30 capsule   1   . Ticagrelor (BRILINTA) 90 MG TABS tablet    Oral   Take 1 tablet (90 mg total) by mouth 2 (two) times daily.   60 tablet   1     BP 136/76  Temp(Src) 102.7 F (39.3 C) (Oral)  Resp 18  SpO2 94%  Physical Exam  Nursing note and vitals reviewed.  72 year old male, resting comfortably and in no acute distress. Vital signs are significant for fever with temperature 102.7. Oxygen saturation is 94%, which is normal. Head is normocephalic and atraumatic. PERRLA, EOMI. Oropharynx is clear. Increased vascularity is noted in the right side of the septum and this is apparently the site of his recent nosebleeds. Neck is nontender and supple without adenopathy or JVD. Back is nontender and there is no CVA tenderness. Lungs have decreased breath sounds at the left base. Fine rales are noted at both bases-worse on the right. Chest is nontender. Heart has regular rate and rhythm without murmur. Abdomen is soft, flat, nontender without masses or hepatosplenomegaly and peristalsis is normoactive. Extremities have no cyanosis or edema, full range of motion is present. Skin is warm and dry without rash. Neurologic: Mental status is normal, cranial nerves are intact, there are no motor or sensory deficits.  ED Course  Procedures (including critical care time)  Results for orders placed during the hospital encounter of 08/11/12  CBC WITH DIFFERENTIAL      Result Value Range   WBC 2.3 (*) 4.0 - 10.5 K/uL   RBC 2.94 (*) 4.22 - 5.81 MIL/uL   Hemoglobin 8.3 (*) 13.0 - 17.0 g/dL   HCT 16.1 (*) 09.6 - 04.5 %   MCV 82.7  78.0 - 100.0 fL   MCH 28.2  26.0 - 34.0 pg   MCHC 34.2  30.0 - 36.0 g/dL   RDW 40.9  81.1 - 91.4 %   Platelets 180  150 - 400 K/uL   Neutrophils Relative % 65  43 - 77 %   Neutro Abs 1.5 (*) 1.7 - 7.7 K/uL   Lymphocytes Relative 12  12 - 46 %   Lymphs Abs 0.3 (*) 0.7 - 4.0 K/uL   Monocytes Relative 20 (*) 3 - 12 %   Monocytes Absolute 0.5  0.1 - 1.0 K/uL   Eosinophils Relative  3  0 - 5 %   Eosinophils Absolute 0.1  0.0 -  0.7 K/uL   Basophils Relative 0  0 - 1 %   Basophils Absolute 0.0  0.0 - 0.1 K/uL  BASIC METABOLIC PANEL      Result Value Range   Sodium 131 (*) 135 - 145 mEq/L   Potassium 4.3  3.5 - 5.1 mEq/L   Chloride 99  96 - 112 mEq/L   CO2 23  19 - 32 mEq/L   Glucose, Bld 123 (*) 70 - 99 mg/dL   BUN 17  6 - 23 mg/dL   Creatinine, Ser 1.61  0.50 - 1.35 mg/dL   Calcium 8.6  8.4 - 09.6 mg/dL   GFR calc non Af Amer 88 (*) >90 mL/min   GFR calc Af Amer >90  >90 mL/min  URINALYSIS, ROUTINE W REFLEX MICROSCOPIC      Result Value Range   Color, Urine YELLOW  YELLOW   APPearance CLEAR  CLEAR   Specific Gravity, Urine 1.022  1.005 - 1.030   pH 7.0  5.0 - 8.0   Glucose, UA NEGATIVE  NEGATIVE mg/dL   Hgb urine dipstick NEGATIVE  NEGATIVE   Bilirubin Urine NEGATIVE  NEGATIVE   Ketones, ur NEGATIVE  NEGATIVE mg/dL   Protein, ur NEGATIVE  NEGATIVE mg/dL   Urobilinogen, UA 1.0  0.0 - 1.0 mg/dL   Nitrite NEGATIVE  NEGATIVE   Leukocytes, UA NEGATIVE  NEGATIVE   Dg Chest 2 View  08/11/2012   *RADIOLOGY REPORT*  Clinical Data: Fever.  Shortness of breath.  Cough and nose bleeds over the past month.  Pancreatic cancer with liver metastasis.  CHEST - 2 VIEW  Comparison: 06/30/2012  Findings: Interval placement of a power port type central venous catheter with tip over the mid SVC region. No pneumothorax. Emphysematous changes and scattered fibrosis in the lungs. The heart size and pulmonary vascularity are normal. The lungs appear clear and expanded without focal air space disease or consolidation. No blunting of the costophrenic angles.  No blunting of costophrenic angles.  Calcified and tortuous aorta. Degenerative changes in the spine.  IMPRESSION: Emphysematous changes and fibrosis in the lungs.  No evidence of active pulmonary disease.   Original Report Authenticated By: Burman Nieves, M.D.      1. Fever   2. Cancer of pancreas       MDM  Fever in a cancer patient on chemotherapy. Old records are  reviewed and last WBC was about one week ago and was 2.8 with adequate neutrophils. CBC will need to be repeated today. If he is leukopenic, he will need to be admitted. Septic workup was also initiated with blood cultures, he urinalysis, and chest x-ray.  Workup is unremarkable although WBC has fallen slightly to 2.3. He has adequate absolute neutrophil count of over 1000. Case is discussed with Dr.Murinson, on call for Dr. Truett Perna, who recommended CT scan to look for possible pulmonary embolism and possible occult pneumonia. This has been ordered. If negative, we'll discuss with Dr. Truett Perna regarding possible antibiotic use.     CT appears to show multi-lobar pneumonia. Case is signed out to Dr. Ranae Palms pending final report on CT scan.   Dione Booze, MD 08/12/12 [phone]

## 2012-08-11 NOTE — ED Notes (Signed)
Pt states he is a ca pt, woke up this morning with a fever, pts daughter states he has been "sluggish" the past couple days and has also had a more "junky" cough.

## 2012-08-11 NOTE — Care Management Note (Signed)
    Page 1 of 1   08/11/2012     2:24:40 PM   CARE MANAGEMENT NOTE 08/11/2012  Patient:  Randall [name], Randall [name]   Account Number:  [id number]  Date Initiated:  08/11/2012  Documentation initiated by:  Lanier Clam  Subjective/Objective Assessment:   ADMITTED W/COUGH.PNA.JY:NWGNFAOZHY CA,CAD,MI.READMIT 5/13-5/17/14     Action/Plan:   FROM HOME.   Anticipated DC Date:  08/13/2012   Anticipated DC Plan:  HOME/SELF CARE      DC Planning Services  CM consult      Choice offered to / List presented to:             Status of service:  In process, will continue to follow Medicare Important Message given?   (If response is "NO", the following Medicare IM given date fields will be blank) Date Medicare IM given:   Date Additional Medicare IM given:    Discharge Disposition:    Per UR Regulation:  Reviewed for med. necessity/level of care/duration of stay  If discussed at Long Length of Stay Meetings, dates discussed:    Comments:  08/11/12 [facility] Abiola Behring RN,BSN NCM [phone]

## 2012-08-11 NOTE — ED Provider Notes (Signed)
Received pt in from Dr Preston Fleeting in handover pending CT chest to r/o PE/PNA. CT consistent with multifocal pneumonia. Pt is comfortable in no respiratory distress. VS stable. Will treat as hospital aquired pneumonia given recent admission. Discussed with Dr Sharyn Lull who will admit pt. Advised holding orders for tele bed and restarting home meds. Will see in ED.   Loren Racer, MD 08/11/12 [phone]

## 2012-08-11 NOTE — ED Notes (Signed)
Attempted to call report will call back. 

## 2012-08-11 NOTE — H&P (Signed)
Randall [name] is an 72 y.o. male.   Chief Complaint: Cough associated with high-grade fever and chills  HPI: Patient is 72 year old male with past medical history significant for coronary artery disease status post anterolateral wall myocardial infarction in April of 2014 status post PTCA stenting 100% occluded proximal LAD noted to have multivessel coronary artery disease, recently diagnosed metastatic adenocarcinoma of pancreas, hypertension, hypercholesteremia, history of tobacco abuse, positive family history of coronary artery disease, anemia of chronic disease, came to the ER complaining of cough with whitish phlegm associated with dry throat chills cold for last few days today early this morning noted by daughter to have temp of 102.9. And came to the ER patient denies any chest pain complains of mild shortness of breath. Denies any hemoptysis. Denies nausea vomiting or diarrhea. Denies any black tarry stools. Denies PND orthopnea leg swelling. Denies palpitation lightheadedness or syncope. Patient states he had chemotherapy on last Wednesday one week ago. Patient had CT of the chest in the ED which showed bilateral multi-lobar infiltrates and also questionable metastases.  Past Medical History  Diagnosis Date  . Anxiety   . Pancreatic cancer metastasized to liver   . MI (myocardial infarction)   . HTN (hypertension)   . Right rotator cuff tear   . Diverticulosis   . Malignant neoplasm of pancreas, part unspecified 07/18/2012    Past Surgical History  Procedure Laterality Date  . Cardiac catheterization    . Coronary stent placement      Family History  Problem Relation Age of Onset  . Cancer Mother     oropharengial  . Heart attack Father   . Ovarian cancer Sister   . Anuerysm Brother     AAA   Social History:  reports that he has been smoking Cigarettes and Cigars.  He has been smoking about 0.00 packs per day. He has never used smokeless tobacco. He reports that he does not  drink alcohol or use illicit drugs.  Allergies: No Known Allergies   (Not in a hospital admission)  Results for orders placed during the hospital encounter of 08/11/12 (from the past 48 hour(s))  CBC WITH DIFFERENTIAL     Status: Abnormal   Collection Time    08/11/12  6:17 AM      Result Value Range   WBC 2.3 (*) 4.0 - 10.5 K/uL   RBC 2.94 (*) 4.22 - 5.81 MIL/uL   Hemoglobin 8.3 (*) 13.0 - 17.0 g/dL   HCT 16.1 (*) 09.6 - 04.5 %   MCV 82.7  78.0 - 100.0 fL   MCH 28.2  26.0 - 34.0 pg   MCHC 34.2  30.0 - 36.0 g/dL   RDW 40.9  81.1 - 91.4 %   Platelets 180  150 - 400 K/uL   Neutrophils Relative % 65  43 - 77 %   Neutro Abs 1.5 (*) 1.7 - 7.7 K/uL   Lymphocytes Relative 12  12 - 46 %   Lymphs Abs 0.3 (*) 0.7 - 4.0 K/uL   Monocytes Relative 20 (*) 3 - 12 %   Monocytes Absolute 0.5  0.1 - 1.0 K/uL   Eosinophils Relative 3  0 - 5 %   Eosinophils Absolute 0.1  0.0 - 0.7 K/uL   Basophils Relative 0  0 - 1 %   Basophils Absolute 0.0  0.0 - 0.1 K/uL  BASIC METABOLIC PANEL     Status: Abnormal   Collection Time    08/11/12  6:17 AM  Result Value Range   Sodium 131 (*) 135 - 145 mEq/L   Potassium 4.3  3.5 - 5.1 mEq/L   Chloride 99  96 - 112 mEq/L   CO2 23  19 - 32 mEq/L   Glucose, Bld 123 (*) 70 - 99 mg/dL   BUN 17  6 - 23 mg/dL   Creatinine, Ser 1.61  0.50 - 1.35 mg/dL   Calcium 8.6  8.4 - 09.6 mg/dL   GFR calc non Af Amer 88 (*) >90 mL/min   GFR calc Af Amer >90  >90 mL/min   Comment:            The eGFR has been calculated     using the CKD EPI equation.     This calculation has not been     validated in all clinical     situations.     eGFR's persistently     <90 mL/min signify     possible Chronic Kidney Disease.  URINALYSIS, ROUTINE W REFLEX MICROSCOPIC     Status: None   Collection Time    08/11/12  6:38 AM      Result Value Range   Color, Urine YELLOW  YELLOW   APPearance CLEAR  CLEAR   Specific Gravity, Urine 1.022  1.005 - 1.030   pH 7.0  5.0 - 8.0    Glucose, UA NEGATIVE  NEGATIVE mg/dL   Hgb urine dipstick NEGATIVE  NEGATIVE   Bilirubin Urine NEGATIVE  NEGATIVE   Ketones, ur NEGATIVE  NEGATIVE mg/dL   Protein, ur NEGATIVE  NEGATIVE mg/dL   Urobilinogen, UA 1.0  0.0 - 1.0 mg/dL   Nitrite NEGATIVE  NEGATIVE   Leukocytes, UA NEGATIVE  NEGATIVE   Comment: MICROSCOPIC NOT DONE ON URINES WITH NEGATIVE PROTEIN, BLOOD, LEUKOCYTES, NITRITE, OR GLUCOSE <1000 mg/dL.   Dg Chest 2 View  08/11/2012   *RADIOLOGY REPORT*  Clinical Data: Fever.  Shortness of breath.  Cough and nose bleeds over the past month.  Pancreatic cancer with liver metastasis.  CHEST - 2 VIEW  Comparison: 06/30/2012  Findings: Interval placement of a power port type central venous catheter with tip over the mid SVC region. No pneumothorax. Emphysematous changes and scattered fibrosis in the lungs. The heart size and pulmonary vascularity are normal. The lungs appear clear and expanded without focal air space disease or consolidation. No blunting of the costophrenic angles.  No blunting of costophrenic angles.  Calcified and tortuous aorta. Degenerative changes in the spine.  IMPRESSION: Emphysematous changes and fibrosis in the lungs.  No evidence of active pulmonary disease.   Original Report Authenticated By: Burman Nieves, M.D.   Ct Angio Chest W/cm &/or Wo Cm  08/11/2012   *RADIOLOGY REPORT*  Clinical Data: Fever, cough and shortness of breath.  History of pancreatic cancer.  Ongoing chemotherapy.  CT ANGIOGRAPHY CHEST  Technique:  Multidetector CT imaging of the chest using the standard protocol during bolus administration of intravenous contrast. Multiplanar reconstructed images including MIPs were obtained and reviewed to evaluate the vascular anatomy.  Contrast: OMNIPAQUE IOHEXOL 350 MG/ML SOLN  Comparison: Chest radiograph 08/11/2012.  Findings: Negative for pulmonary embolus.  Mediastinal and hilar lymph nodes are not enlarged by CT size criteria.  Coronary artery  calcification.  Heart size normal.  No pericardial effusion.  Mild biapical pleural parenchymal scarring.  Centrilobular and paraseptal emphysema.  Patchy bilateral ground-glass air space disease with minimal associated consolidation.  A few scattered pulmonary nodules measure up to 9 mm in  the posterior left lower lobe (image 72).  No pleural fluid.  Airway is unremarkable.  Incidental imaging of the upper abdomen shows numerous intermediate low attenuation lesions in the liver, measuring up to 4.4 cm in the left hepatic lobe.  There may be thickening of the medial limb right adrenal gland.  No worrisome lytic or sclerotic lesions. Degenerative changes are seen in the spine.  IMPRESSION:  1.  Negative for pulmonary embolus. 2.  Multilobar patchy ground-glass and scattered consolidation, most indicative of multilobar pneumonia. Drug reaction or drug toxicity would be another consideration. 3.   A few scattered pulmonary nodules, predominating in the left lower lobe.  Given the patient's diagnosis of pancreatic carcinoma, findings are worrisome for metastatic disease. 4.  Numerous hepatic lesions are better evaluated on 06/30/2012, but indicative of metastatic disease.   Original Report Authenticated By: Leanna Battles, M.D.    Review of Systems  Constitutional: Positive for fever and chills.  Eyes: Negative for blurred vision and double vision.  Respiratory: Positive for cough, sputum production and shortness of breath. Negative for hemoptysis.   Cardiovascular: Negative for chest pain, palpitations, orthopnea, claudication and leg swelling.  Gastrointestinal: Negative for nausea and vomiting.  Genitourinary: Negative for dysuria.  Neurological: Negative for dizziness and headaches.    Blood pressure 136/76, temperature 99.8 F (37.7 C), temperature source Oral, resp. rate 18, SpO2 94.00%. Physical Exam  Constitutional: He is oriented to person, place, and time.  HENT:  Head: Normocephalic and  atraumatic.  Eyes: Conjunctivae are normal. Pupils are equal, round, and reactive to light. Left eye exhibits no discharge. No scleral icterus.  Neck: Normal range of motion. Neck supple. No JVD present. No tracheal deviation present. No thyromegaly present.  Cardiovascular: Normal rate, regular rhythm and intact distal pulses.   Murmur (Soft systolic murmur noted no S3 gallop) heard. Respiratory:  Decreased breath sound at bases with bilateral occasional rhonchi  GI: Soft. Bowel sounds are normal. He exhibits no distension. There is no tenderness. There is no rebound.  Musculoskeletal: He exhibits no edema and no tenderness.  Neurological: He is alert and oriented to person, place, and time.     Assessment/Plan Probable bilateral pneumonia Pancytopenia status post chemotherapy Metastatic adenocarcinoma of pancreas Coronary artery disease status post anterolateral wall myocardial infarction status post PCI to LAD in the past Multivessel coronary artery disease Hypertension Hypercholesteremia Acute on Chronic anemia rule out GI loss History of tobacco abuse Positive family history of coronary artery disease Plan As per orders Dr. Algie Coffer on call for me until Monday morning  Arva Slaugh N 08/11/2012, 11:31 AM

## 2012-08-12 LAB — EXPECTORATED SPUTUM ASSESSMENT W GRAM STAIN, RFLX TO RESP C

## 2012-08-12 LAB — BASIC METABOLIC PANEL
BUN: 16 mg/dL (ref 6–23)
CO2: 23 mEq/L (ref 19–32)
Chloride: 97 mEq/L (ref 96–112)
Creatinine, Ser: 0.83 mg/dL (ref 0.50–1.35)
Glucose, Bld: 108 mg/dL — ABNORMAL HIGH (ref 70–99)

## 2012-08-12 LAB — CBC
HCT: 21.6 % — ABNORMAL LOW (ref 39.0–52.0)
MCH: 27.9 pg (ref 26.0–34.0)
MCHC: 34.3 g/dL (ref 30.0–36.0)
MCV: 81.5 fL (ref 78.0–100.0)
RDW: 15.5 % (ref 11.5–15.5)

## 2012-08-12 LAB — LEGIONELLA ANTIGEN, URINE

## 2012-08-12 MED ORDER — SODIUM CHLORIDE 0.9 % IJ SOLN
10.0000 mL | INTRAMUSCULAR | Status: DC | PRN
  Administered 2012-08-17 – 2012-08-18 (×4): 10 mL

## 2012-08-12 MED ORDER — ENSURE COMPLETE PO LIQD
237.0000 mL | Freq: Two times a day (BID) | ORAL | Status: DC
  Administered 2012-08-13 – 2012-08-18 (×4): 237 mL via ORAL

## 2012-08-12 NOTE — Progress Notes (Signed)
Subjective:  Not feeling good. T max 100.2. Had visit from Cancer doctor.  Objective:  Vital Signs in the last 24 hours: Temp:  [98 F (36.7 C)-100.2 F (37.9 C)] 99.5 F (37.5 C) (06/12 0808) Pulse Rate:  [69-80] 80 (06/12 0533) Cardiac Rhythm:  [-] Normal sinus rhythm (06/12 0810) Resp:  [18] 18 (06/12 0533) BP: (91-104)/(46-60) 104/55 mmHg (06/12 0533) SpO2:  [90 %-95 %] 95 % (06/12 0730) Weight:  [71.623 kg (157 lb 14.4 oz)] 71.623 kg (157 lb 14.4 oz) (06/11 1326)  Physical Exam: BP Readings from Last 1 Encounters:  08/12/12 104/55     Wt Readings from Last 1 Encounters:  08/11/12 71.623 kg (157 lb 14.4 oz)    Weight change:   HEENT: Energy/AT, Eyes-Blue, PERL, EOMI, Conjunctiva-Pale, Sclera-Non-icteric Neck: No JVD, No bruit, Trachea midline. Lungs:  Clear, Bilateral. Cardiac:  Regular rhythm, normal S1 and S2, no S3.  Abdomen:  Soft, non-distended, mild epigastric tenderness. Extremities:  No edema present. No cyanosis. No clubbing. CNS: AxOx3, Cranial nerves grossly intact, moves all 4 extremities. Right handed. Skin: Warm and dry.   Intake/Output from previous day: 06/11 0701 - 06/12 0700 In: 1200 [I.V.:900; IV Piggyback:300] Out: -     Lab Results: BMET    Component Value Date/Time   NA 130* 08/12/2012 0525   NA 134* 08/04/2012 0955   K 3.8 08/12/2012 0525   K 4.5 08/04/2012 0955   CL 97 08/12/2012 0525   CL 103 08/04/2012 0955   CO2 23 08/12/2012 0525   CO2 25 08/04/2012 0955   GLUCOSE 108* 08/12/2012 0525   GLUCOSE 137* 08/04/2012 0955   BUN 16 08/12/2012 0525   BUN 16.7 08/04/2012 0955   CREATININE 0.83 08/12/2012 0525   CREATININE 0.8 08/04/2012 0955   CALCIUM 8.3* 08/12/2012 0525   CALCIUM 8.8 08/04/2012 0955   GFRNONAA 86* 08/12/2012 0525   GFRAA >90 08/12/2012 0525   CBC    Component Value Date/Time   WBC 2.5* 08/12/2012 0525   WBC 2.8* 08/04/2012 0955   RBC 2.65* 08/12/2012 0525   RBC 3.37* 08/04/2012 0955   HGB 7.4* 08/12/2012 0525   HGB 9.8* 08/04/2012 0955   HCT  21.6* 08/12/2012 0525   HCT 28.4* 08/04/2012 0955   PLT 163 08/12/2012 0525   PLT 134* 08/04/2012 0955   MCV 81.5 08/12/2012 0525   MCV 84.2 08/04/2012 0955   MCH 27.9 08/12/2012 0525   MCH 29.1 08/04/2012 0955   MCHC 34.3 08/12/2012 0525   MCHC 34.6 08/04/2012 0955   RDW 15.5 08/12/2012 0525   RDW 14.0 08/04/2012 0955   LYMPHSABS 0.3* 08/11/2012 0617   LYMPHSABS 0.3* 08/04/2012 0955   MONOABS 0.5 08/11/2012 0617   MONOABS 0.4 08/04/2012 0955   EOSABS 0.1 08/11/2012 0617   EOSABS 0.1 08/04/2012 0955   BASOSABS 0.0 08/11/2012 0617   BASOSABS 0.0 08/04/2012 0955   CARDIAC ENZYMES Lab Results  Component Value Date   CKTOTAL 196 06/16/2012   CKMB 13.3* 06/16/2012   TROPONINI 16.39* 06/19/2012    Scheduled Meds: . aspirin EC  81 mg Oral Daily  . atorvastatin  80 mg Oral q1800  . carvedilol  6.25 mg Oral BID WC  . citalopram  20 mg Oral Daily  . ferrous sulfate  325 mg Oral TID WC  . heparin  5,000 Units Subcutaneous Q8H  . levalbuterol  1.25 mg Nebulization TID  . piperacillin-tazobactam (ZOSYN)  IV  3.375 g Intravenous Q8H  . ramipril  1.25 mg  Oral Daily  . Ticagrelor  90 mg Oral BID  . vancomycin  1,000 mg Intravenous Q12H   Continuous Infusions:  PRN Meds:.acetaminophen, ALPRAZolam, docusate sodium, HYDROcodone-acetaminophen, nitroGLYCERIN  Assessment/Plan: Pneumonia Pancytopenia status post chemotherapy  Metastatic adenocarcinoma of pancreas  Coronary artery disease status post anterolateral wall myocardial infarction status post PCI to LAD in the past  Multivessel coronary artery disease  Hypertension  Hypercholesteremia  Acute on Chronic anemia rule out GI loss  History of tobacco abuse  Positive family history of coronary artery disease  Continue antibiotic.   LOS: 1 day    Orpah Cobb  MD  08/12/2012, 9:10 AM

## 2012-08-12 NOTE — Progress Notes (Signed)
INITIAL NUTRITION ASSESSMENT  Pt meets criteria for severe MALNUTRITION in the context of chronic illness as evidenced by <75% estimated energy intake in the past few months with 13.7% weight loss in the past 6 months.  DOCUMENTATION CODES Per approved criteria  -Severe malnutrition in the context of chronic illness   INTERVENTION: - Ensure Complete BID - Educated pt and daughter on strategies to help with taste changes from chemotherapy. Provided handout of this information. Provided pt with contact information for outpatient [facility] [address].  - Encouraged increased PO intake - Will continue to monitor   NUTRITION DIAGNOSIS: Increased nutrient needs related to metastatic pancreatic CA with recent chemotherapy as evidenced by MD notes.    Goal: Pt to consume >90% of meals/supplements  Monitor:  Weights, labs, intake  Reason for Assessment: Nutrition risk   72 y.o. male  Admitting Dx: Cough associated with high-grade fever and chills  ASSESSMENT: Pt with metastatic pancreatic CA s/p completion of cycle 1 of gemcitabine/Abraxane on 08/04/2012. Pt has lost 25 pounds unintentionally in the past 6 months. Pt had struggled with poor appetite and taste changes but family reports pt has gained 2 pounds recently. Pt reports he eats mostly sweet things because that's what he has a taste for. He c/o that tastes that he used to enjoy don't appeal to him anymore. Pt states he has dry mouth occasionally. Pt reports his daughter makes him drink Ensure at home. Pt states he eats 3 meals/day at home. Pt seen by RD during admission last month who noted pt with poor intake in April since his heart attack.   Height: Ht Readings from Last 1 Encounters:  08/11/12 5\' 9"  (1.753 m)    Weight: Wt Readings from Last 1 Encounters:  08/11/12 157 lb 14.4 oz (71.623 kg)    Ideal Body Weight: 160 lb  % Ideal Body Weight: 98  Wt Readings from Last 10 Encounters:  08/11/12 157 lb 14.4 oz  (71.623 kg)  08/04/12 156 lb 8 oz (70.988 kg)  07/22/12 161 lb 9.6 oz (73.3 kg)  07/21/12 161 lb 14.4 oz (73.437 kg)  07/13/12 165 lb (74.844 kg)  07/09/12 166 lb (75.297 kg)  07/07/12 166 lb (75.297 kg)  07/07/12 165 lb 4 oz (74.957 kg)  07/01/12 174 lb (78.926 kg)  06/30/12 174 lb (78.926 kg)    Usual Body Weight: 182 lb in January 2014  % Usual Body Weight: 86  BMI:  Body mass index is 23.31 kg/(m^2).  Estimated Nutritional Needs: Kcal: 1800-2200 Protein: 85-105g Fluid: 1.8-2.2L/day  Skin: Intact   Diet Order: Cardiac  EDUCATION NEEDS: -Education needs addressed - discussed strategies to help with changes in taste    Intake/Output Summary (Last 24 hours) at 08/12/12 1620 Last data filed at 08/12/12 0709  Gross per 24 hour  Intake   1300 ml  Output      0 ml  Net   1300 ml    Last BM: 6/10  Labs:   Recent Labs Lab 08/11/12 0617 08/12/12 0525  NA 131* 130*  K 4.3 3.8  CL 99 97  CO2 23 23  BUN 17 16  CREATININE 0.78 0.83  CALCIUM 8.6 8.3*  GLUCOSE 123* 108*    CBG (last 3)  No results found for this basename: GLUCAP,  in the last 72 hours  Scheduled Meds: . aspirin EC  81 mg Oral Daily  . atorvastatin  80 mg Oral q1800  . carvedilol  6.25 mg Oral BID  WC  . citalopram  20 mg Oral Daily  . ferrous sulfate  325 mg Oral TID WC  . heparin  5,000 Units Subcutaneous Q8H  . levalbuterol  1.25 mg Nebulization TID  . piperacillin-tazobactam (ZOSYN)  IV  3.375 g Intravenous Q8H  . ramipril  1.25 mg Oral Daily  . Ticagrelor  90 mg Oral BID  . vancomycin  1,000 mg Intravenous Q12H    Continuous Infusions:   Past Medical History  Diagnosis Date  . Anxiety   . Pancreatic cancer metastasized to liver   . MI (myocardial infarction)   . HTN (hypertension)   . Right rotator cuff tear   . Diverticulosis   . Malignant neoplasm of pancreas, part unspecified 07/18/2012    Past Surgical History  Procedure Laterality Date  . Cardiac catheterization     . Coronary stent placement       Levon Hedger MS, RD, LDN [phone] Pager [phone] After Hours Pager

## 2012-08-12 NOTE — Progress Notes (Signed)
IP PROGRESS NOTE  Subjective:   He is well-known to me with a history of metastatic pancreas cancer. He completed cycle 1 of gemcitabine/Abraxane on 08/04/2012. He reports tolerating the chemotherapy well.  Mr. Kirkwood was admitted yesterday with a high fever. He has noted a cough over the past week. The cough has generally been nonproductive. He noted chills for several days prior to hospital admission. The abdominal pain has improved.  Objective: Vital signs in last 24 hours: Blood pressure 104/55, pulse 80, temperature 99.5 F (37.5 C), temperature source Oral, resp. rate 18, height 5\' 9"  (1.753 m), weight 157 lb 14.4 oz (71.623 kg), SpO2 95.00%.  Intake/Output from previous day: 06/11 0701 - 06/12 0700 In: 1200 [I.V.:900; IV Piggyback:300] Out: -   Physical Exam:  HEENT: No thrush or ulcers Lungs: Inspiratory rhonchi at the right posterior base, no respiratory distress Cardiac: Regular rate and rhythm Abdomen: No mass, no hepatomegaly Extremities: No leg edema   Portacath/PICC-without erythema  Lab Results:  Recent Labs  08/11/12 0617 08/12/12 0525  WBC 2.3* 2.5*  HGB 8.3* 7.4*  HCT 24.3* 21.6*  PLT 180 163   ANC 1.5 on 08/11/2012  BMET  Recent Labs  08/11/12 0617 08/12/12 0525  NA 131* 130*  K 4.3 3.8  CL 99 97  CO2 23 23  GLUCOSE 123* 108*  BUN 17 16  CREATININE 0.78 0.83  CALCIUM 8.6 8.3*    Studies/Results: Dg Chest 2 View  08/11/2012   *RADIOLOGY REPORT*  Clinical Data: Fever.  Shortness of breath.  Cough and nose bleeds over the past month.  Pancreatic cancer with liver metastasis.  CHEST - 2 VIEW  Comparison: 06/30/2012  Findings: Interval placement of a power port type central venous catheter with tip over the mid SVC region. No pneumothorax. Emphysematous changes and scattered fibrosis in the lungs. The heart size and pulmonary vascularity are normal. The lungs appear clear and expanded without focal air space disease or consolidation. No  blunting of the costophrenic angles.  No blunting of costophrenic angles.  Calcified and tortuous aorta. Degenerative changes in the spine.  IMPRESSION: Emphysematous changes and fibrosis in the lungs.  No evidence of active pulmonary disease.   Original Report Authenticated By: Burman Nieves, M.D.   Ct Angio Chest W/cm &/or Wo Cm  08/11/2012   *RADIOLOGY REPORT*  Clinical Data: Fever, cough and shortness of breath.  History of pancreatic cancer.  Ongoing chemotherapy.  CT ANGIOGRAPHY CHEST  Technique:  Multidetector CT imaging of the chest using the standard protocol during bolus administration of intravenous contrast. Multiplanar reconstructed images including MIPs were obtained and reviewed to evaluate the vascular anatomy.  Contrast: OMNIPAQUE IOHEXOL 350 MG/ML SOLN  Comparison: Chest radiograph 08/11/2012.  Findings: Negative for pulmonary embolus.  Mediastinal and hilar lymph nodes are not enlarged by CT size criteria.  Coronary artery calcification.  Heart size normal.  No pericardial effusion.  Mild biapical pleural parenchymal scarring.  Centrilobular and paraseptal emphysema.  Patchy bilateral ground-glass air space disease with minimal associated consolidation.  A few scattered pulmonary nodules measure up to 9 mm in the posterior left lower lobe (image 72).  No pleural fluid.  Airway is unremarkable.  Incidental imaging of the upper abdomen shows numerous intermediate low attenuation lesions in the liver, measuring up to 4.4 cm in the left hepatic lobe.  There may be thickening of the medial limb right adrenal gland.  No worrisome lytic or sclerotic lesions. Degenerative changes are seen in the spine.  IMPRESSION:  1.  Negative for pulmonary embolus. 2.  Multilobar patchy ground-glass and scattered consolidation, most indicative of multilobar pneumonia. Drug reaction or drug toxicity would be another consideration. 3.   A few scattered pulmonary nodules, predominating in the left lower lobe.   Given the patient's diagnosis of pancreatic carcinoma, findings are worrisome for metastatic disease. 4.  Numerous hepatic lesions are better evaluated on 06/30/2012, but indicative of metastatic disease.   Original Report Authenticated By: Leanna Battles, M.D.    Medications: I have reviewed the patient's current medications.  Assessment/Plan: 1.Metastatic pancreas cancer-the clinical presentation, elevated CA 19-9, and abdomen CT are consistent with a diagnosis of metastatic pancreas cancer . 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  -initiation of gemcitabine/Abraxane on 07/21/2012, last treated on 08/04/2012  2. Pain secondary to #1- relieved with hydrocodone, improved  3. Acute myocardial infarction 06/16/2012-status post PTCA/drug-eluting stent , maintained on Brilinta  4. Status post Port-A-Cath placement 07/16/2012  5. Admission with a high fever and chills on 08/11/2012, a CT of the chest revealed multilobar infiltrates 6. Pancytopenia secondary to chemotherapy  Mr. Ramthun is admitted with a febrile illness. The clinical presentation is most consistent with pneumonia, but he could have chemotherapy-related pneumonitis. Gemcitabine has been associated with pneumonitis. I have a low clinical suspicion for metastatic pancreas cancer involving the lungs.  Recommendations:  1. Continue broad-spectrum antibiotics and followup cultures 2. Consider infectious disease/pulmonary medicine consultation if his clinical status does not improve over the next few days 3. Check CBC with a white cell differential on 08/13/2012 4. I will be out until 08/16/2012. I will ask oncology to check on him 08/13/2012. Please call as needed.       LOS: 1 day   Kirtan Sada, Jillyn Hidden  08/12/2012, 1:34 PM

## 2012-08-13 ENCOUNTER — Encounter (HOSPITAL_COMMUNITY): Payer: Medicare Other

## 2012-08-13 ENCOUNTER — Encounter (HOSPITAL_COMMUNITY): Payer: Self-pay | Admitting: Internal Medicine

## 2012-08-13 ENCOUNTER — Inpatient Hospital Stay (HOSPITAL_COMMUNITY): Payer: Medicare Other

## 2012-08-13 ENCOUNTER — Other Ambulatory Visit: Payer: Self-pay

## 2012-08-13 DIAGNOSIS — E43 Unspecified severe protein-calorie malnutrition: Secondary | ICD-10-CM | POA: Insufficient documentation

## 2012-08-13 DIAGNOSIS — C787 Secondary malignant neoplasm of liver and intrahepatic bile duct: Secondary | ICD-10-CM

## 2012-08-13 DIAGNOSIS — J189 Pneumonia, unspecified organism: Secondary | ICD-10-CM

## 2012-08-13 DIAGNOSIS — J9600 Acute respiratory failure, unspecified whether with hypoxia or hypercapnia: Secondary | ICD-10-CM

## 2012-08-13 DIAGNOSIS — C259 Malignant neoplasm of pancreas, unspecified: Secondary | ICD-10-CM

## 2012-08-13 DIAGNOSIS — R0902 Hypoxemia: Secondary | ICD-10-CM

## 2012-08-13 LAB — BLOOD GAS, ARTERIAL
Acid-base deficit: 1.3 mmol/L (ref 0.0–2.0)
Drawn by: 232811
O2 Content: 4 L/min
O2 Saturation: 92.6 %
TCO2: 20.2 mmol/L (ref 0–100)
pO2, Arterial: 65.9 mmHg — ABNORMAL LOW (ref 80.0–100.0)

## 2012-08-13 LAB — BASIC METABOLIC PANEL
BUN: 14 mg/dL (ref 6–23)
Calcium: 8.4 mg/dL (ref 8.4–10.5)
Chloride: 100 mEq/L (ref 96–112)
Creatinine, Ser: 0.87 mg/dL (ref 0.50–1.35)
GFR calc Af Amer: >90 mL/min (ref >90)
GFR calc non Af Amer: 84 mL/min — ABNORMAL LOW (ref >90)

## 2012-08-13 LAB — CBC WITH DIFFERENTIAL/PLATELET
Basophils Absolute: 0 10*3/uL (ref 0.0–0.1)
Basophils Relative: 0 % (ref 0–1)
HCT: 21.8 % — ABNORMAL LOW (ref 39.0–52.0)
MCHC: 33.5 g/dL (ref 30.0–36.0)
Monocytes Absolute: 0.5 10*3/uL (ref 0.1–1.0)
Neutro Abs: 1.6 10*3/uL — ABNORMAL LOW (ref 1.7–7.7)
RDW: 15.7 % — ABNORMAL HIGH (ref 11.5–15.5)

## 2012-08-13 LAB — ABO/RH: ABO/RH(D): B NEG

## 2012-08-13 MED ORDER — SODIUM CHLORIDE 0.9 % IV SOLN
INTRAVENOUS | Status: DC
  Administered 2012-08-13: 20 mL/h via INTRAVENOUS
  Administered 2012-08-13 – 2012-08-17 (×4): via INTRAVENOUS

## 2012-08-13 MED ORDER — FUROSEMIDE 10 MG/ML IJ SOLN
20.0000 mg | Freq: Once | INTRAMUSCULAR | Status: AC
  Administered 2012-08-13: 20 mg via INTRAVENOUS
  Filled 2012-08-13: qty 2

## 2012-08-13 MED ORDER — VANCOMYCIN HCL 10 G IV SOLR
1250.0000 mg | Freq: Two times a day (BID) | INTRAVENOUS | Status: DC
  Administered 2012-08-13: 1250 mg via INTRAVENOUS
  Filled 2012-08-13 (×2): qty 1250

## 2012-08-13 MED ORDER — AZITHROMYCIN 250 MG PO TABS
250.0000 mg | ORAL_TABLET | ORAL | Status: DC
  Filled 2012-08-13: qty 1

## 2012-08-13 MED ORDER — OSELTAMIVIR PHOSPHATE 75 MG PO CAPS
75.0000 mg | ORAL_CAPSULE | Freq: Two times a day (BID) | ORAL | Status: AC
  Administered 2012-08-14 – 2012-08-18 (×10): 75 mg via ORAL
  Filled 2012-08-13 (×10): qty 1

## 2012-08-13 MED ORDER — AZITHROMYCIN 500 MG PO TABS
500.0000 mg | ORAL_TABLET | Freq: Every day | ORAL | Status: AC
  Administered 2012-08-13: 500 mg via ORAL
  Filled 2012-08-13: qty 1

## 2012-08-13 NOTE — Progress Notes (Signed)
This patient was seen again this evening. His clinical condition has clearly deteriorated since this morning. He is now on nasal O2 and satting at 88%. He seems dyspneic with talking. He now has fairly loud inspiratory rales half way up on the right and some rales at the left base. Chest x-ray from today shows pulmonary infiltrates not evident on the chest x-ray from 08/11/2012.  The patient is being transferred to step down. A critical care consultation has been requested. I have spoken to the patient's daughter and ex-wife. The situation has been explained to them and the patient. I have spoken with Dr. Algie Coffer who is covering for Dr. Marni Griffon, the patient's cardiologist. The patient has confirmed that he wishes to BE a full code.  Samul Dada 08/13/2012 10:07 PM

## 2012-08-13 NOTE — Progress Notes (Signed)
HOSPITAL PROGRESS NOTE   Randall [name] 72 y.o.  1940-07-02    HISTORY: Patient states that he is feeling better and voiced no complaints when I saw him on rounds this morning. He is ambulatory and states he has slight dyspnea on exertion and slight cough which is generally nonproductive. He denies any pain. Appetite is fair. He has had a couple of low-grade temps in the last 24-48 hours.  MEDICATIONS:  Scheduled Meds: . aspirin EC  81 mg Oral Daily  . atorvastatin  80 mg Oral q1800  . azithromycin  500 mg Oral Daily   Followed by  . [START ON 08/14/2012] azithromycin  250 mg Oral Q24H  . carvedilol  6.25 mg Oral BID WC  . citalopram  20 mg Oral Daily  . feeding supplement  237 mL Oral BID BM  . ferrous sulfate  325 mg Oral TID WC  . [START ON 08/14/2012] furosemide  20 mg Intravenous Once  . heparin  5,000 Units Subcutaneous Q8H  . levalbuterol  1.25 mg Nebulization TID  . piperacillin-tazobactam (ZOSYN)  IV  3.375 g Intravenous Q8H  . ramipril  1.25 mg Oral Daily  . Ticagrelor  90 mg Oral BID  . vancomycin  1,250 mg Intravenous Q12H   Continuous Infusions: . sodium chloride 20 mL/hr at 08/13/12 1732   PRN Meds:.acetaminophen, ALPRAZolam, docusate sodium, HYDROcodone-acetaminophen, nitroGLYCERIN, sodium chloride  I have reviewed the patient's current medications.  PHYSICAL EXAM:  Blood pressure 119/58, pulse 82, temperature 98.4 F (36.9 C), temperature source Oral, resp. rate 20, height 5\' 9"  (1.753 m), weight 157 lb 14.4 oz (71.623 kg), SpO2 85.00%. General: No acute distress.   HEENT: No scleral icterus. Mouth and pharynx benign.  Lymphatics: No adenopathy Resp: Inspiratory crackles at the right base. Left lung was clear. No wheezing or rhonchi. Cardiac: Regular rhythm with ectopic beats. No murmur. Abdomen: Benign with no organomegaly or masses palpable. Extrem: No peripheral edema or calf tenderness. Neuro: Nonfocal.  Skin: No rashes, petechiae or  purpura. Right-sided Port-A-Cath is present.    LABS:  CBC:   Recent Labs Lab 08/11/12 0617 08/12/12 0525 08/13/12 0355  WBC 2.3* 2.5* 3.0*  HGB 8.3* 7.4* 7.3*  HCT 24.3* 21.6* 21.8*  PLT 180 163 191  MCV 82.7 81.5 82.6  MCH 28.2 27.9 27.7  MCHC 34.2 34.3 33.5  RDW 15.2 15.5 15.7*  LYMPHSABS 0.3*  --  0.7  MONOABS 0.5  --  0.5  EOSABS 0.1  --  0.1  BASOSABS 0.0  --  0.0     CHEM:   Recent Labs Lab 08/11/12 0617 08/12/12 0525 08/13/12 0355  NA 131* 130* 133*  K 4.3 3.8 3.5  CL 99 97 100  CO2 23 23 24   GLUCOSE 123* 108* 120*  BUN 17 16 14   CREATININE 0.78 0.83 0.87  CALCIUM 8.6 8.3* 8.4     Recent Labs Lab 08/11/12 1330  INR 1.33   Blood cultures from 08/11/2012 out without growth.  Urine for legionella and strep were negative. HIV was negative. Sputum for culture was inadequate sample.  RADIOLOGY:  Dg Chest 2 View  08/13/2012   *RADIOLOGY REPORT*  Clinical Data: 72 year old male with cough and fever.  Abnormal CT scan.  CHEST - 2 VIEW  Comparison: 08/11/2012.  Findings: There is a broad area of hazy and reticular density in the left lung.  Right lower lung involvement.  These are radiographically progressed since 08/11/2012.  Stable lung volumes. Stable right chest Port-A-Cath.  Stable cardiac size and mediastinal contours.  No pneumothorax or pleural effusion. Stable visualized osseous structures.  IMPRESSION: Bilateral (left upper lung predominance and right lower lung predominant) pneumonia with radiographic progression since 08/11/2012 chest films.   Original Report Authenticated By: Erskine Speed, M.D.     ASSESSMENT & PLAN: 1. Metastatic pancreatic cancer---the clinical presentation, elevated CA 19-9, and abdomen CT are consistent with a diagnosis of metastatic pancreas cancer . An ultrasound-guided biopsy of a liver lesion on 07/16/2012 confirmed metastatic adenocarcinoma consistent with metastatic pancreatic cancer.  -initiation of  gemcitabine/Abraxane on 07/21/2012, last treated on 08/04/2012.  2.  Admission with a high fever and chills on 08/11/2012 and a CT of the chest showing multilobar infiltrates. The initial chest x-ray on 08/11/2012 was read as showing emphysematous changes and fibrosis with no evidence of active pulmonary disease. Today's chest x-ray however shows a rather dramatic change with bilateral infiltrates, clearly progressive. The patient has been on empiric vancomycin and Zosyn since admission. I will add Zithromax. To consider empiric steroids for possible gemcitabine-induced pneumonitis.  3. Pancytopenia secondary to chemotherapy. Patient is not neutropenic.  4.  Anemia. Patient was given 2 units of packed red cells today for a hemoglobin of 7.3, down from 9.8 on 08/04/2012. Stools for occult blood have been ordered.  5.  Pain secondary to #1- relieved with hydrocodone, improved.  6.  Acute myocardial infarction 06/16/2012-status post PTCA/drug-eluting stent , maintained on Brilinta.   7.  Status post Port-A-Cath placement 07/16/2012.  8.  Full CODE STATUS.   Etiology of patient's pneumonia/pneumonitis is unclear. No culture confirmation to date. Appearance of today's chest x-ray is of great concern. When seen this morning on rounds, the patient said he was feeling better and seemed to be minimally symptomatic. His O2 sats may be starting to decline. He was given 2 units of packed red cells today for his hemoglobin of 7.3. I am adding Zithromax to his current antibiotic program. He may need a pulmonary and infectious diseases consult. If his condition worsens, he may need to be transferred to the step down unit for closer monitoring. Currently he is a full code.  The possibility of gemcitabine-induced pneumonitis remains. An empiric trial of high-dose steroids will need to be considered.  I have asked our rounding team to follow him over the weekend.   Antowan Samford S 08/13/2012, 8:10 PM

## 2012-08-13 NOTE — Progress Notes (Signed)
ANTIBIOTIC CONSULT NOTE - Follow Up  Pharmacy Consult for Vanco and Zosyn  Indication: PNA  No Known Allergies  Patient Measurements: Height: 5\' 9"  (175.3 cm) Weight: 157 lb 14.4 oz (71.623 kg) IBW/kg (Calculated) : 70.7  Vital Signs: Temp: 98.6 F (37 C) (06/13 0555) Temp src: Oral (06/13 0555) BP: 132/84 mmHg (06/13 0555) Pulse Rate: 90 (06/13 0555)  Labs:  Recent Labs  08/11/12 0617 08/12/12 0525 08/13/12 0355  WBC 2.3* 2.5* 3.0*  HGB 8.3* 7.4* 7.3*  PLT 180 163 191  CREATININE 0.78 0.83 0.87     Medications:  Anti-infectives   Start     Dose/Rate Route Frequency Ordered Stop   08/11/12 2200  vancomycin (VANCOCIN) IVPB 1000 mg/200 mL premix     1,000 mg 200 mL/hr over 60 Minutes Intravenous Every 12 hours 08/11/12 1346     08/11/12 1500  piperacillin-tazobactam (ZOSYN) IVPB 3.375 g     3.375 g 12.5 mL/hr over 240 Minutes Intravenous 3 times per day 08/11/12 1346     08/11/12 0930  levofloxacin (LEVAQUIN) IVPB 500 mg     500 mg 100 mL/hr over 60 Minutes Intravenous  Once 08/11/12 0916 08/11/12 1025   08/11/12 0930  piperacillin-tazobactam (ZOSYN) IVPB 3.375 g     3.375 g 100 mL/hr over 30 Minutes Intravenous  Once 08/11/12 0923 08/11/12 1053   08/11/12 0930  vancomycin (VANCOCIN) IVPB 1000 mg/200 mL premix     1,000 mg 200 mL/hr over 60 Minutes Intravenous  Once 08/11/12 1610 08/11/12 1228     Assessment: 72 yo M admitted 08/11/12 with probable B/L PNA and pancytopenia s/p chemo. Today is Day #3 Vanco 1g IV q12h + Zosyn EI. Vanco trough was subtherapeutic (11.2). Would prefer not to use q8h dosing in a 72 y.o, therefore will increase Vanco dose to 1250mg  IV q12h. Recheck trough at new steady state.  Goal of Therapy:  Vancomycin trough level 15-20 mcg/ml  Plan:  1) Continue Zosyn 3.375g IV q8h (4 hour infusion) 2) Increase Vanco to 1250mg  IV q12h  Darrol Angel, PharmD Pager: [phone] 08/13/2012,11:07 AM

## 2012-08-13 NOTE — Progress Notes (Signed)
Subjective:  Little shortness of breath. T max 99.3 F  Objective:  Vital Signs in the last 24 hours: Temp:  [97.9 F (36.6 C)-99.3 F (37.4 C)] 98.6 F (37 C) (06/13 0555) Pulse Rate:  [78-90] 90 (06/13 0555) Cardiac Rhythm:  [-] Normal sinus rhythm (06/13 0714) Resp:  [18-20] 20 (06/13 0555) BP: (88-132)/(44-84) 132/84 mmHg (06/13 0555) SpO2:  [90 %-96 %] 90 % (06/13 0927)  Physical Exam: BP Readings from Last 1 Encounters:  08/13/12 132/84     Wt Readings from Last 1 Encounters:  08/11/12 71.623 kg (157 lb 14.4 oz)    Weight change:   HEENT: [place]/AT, Eyes-Blue, PERL, EOMI, Conjunctiva-Pale, Sclera-Non-icteric Neck: No JVD, No bruit, Trachea midline. Lungs:  Clear, Bilateral. Cardiac:  Regular rhythm, normal S1 and S2, no S3.  Abdomen:  Soft, non-tender. Extremities:  No edema present. No cyanosis. No clubbing. CNS: AxOx3, Cranial nerves grossly intact, moves all 4 extremities. Right handed. Skin: Warm and dry.   Intake/Output from previous day: 06/12 0701 - 06/13 0700 In: 840 [P.O.:240; IV Piggyback:600] Out: -     Lab Results: BMET    Component Value Date/Time   NA 133* 08/13/2012 0355   NA 134* 08/04/2012 0955   K 3.5 08/13/2012 0355   K 4.5 08/04/2012 0955   CL 100 08/13/2012 0355   CL 103 08/04/2012 0955   CO2 24 08/13/2012 0355   CO2 25 08/04/2012 0955   GLUCOSE 120* 08/13/2012 0355   GLUCOSE 137* 08/04/2012 0955   BUN 14 08/13/2012 0355   BUN 16.7 08/04/2012 0955   CREATININE 0.87 08/13/2012 0355   CREATININE 0.8 08/04/2012 0955   CALCIUM 8.4 08/13/2012 0355   CALCIUM 8.8 08/04/2012 0955   GFRNONAA 84* 08/13/2012 0355   GFRAA >90 08/13/2012 0355   CBC    Component Value Date/Time   WBC 3.0* 08/13/2012 0355   WBC 2.8* 08/04/2012 0955   RBC 2.64* 08/13/2012 0355   RBC 3.37* 08/04/2012 0955   HGB 7.3* 08/13/2012 0355   HGB 9.8* 08/04/2012 0955   HCT 21.8* 08/13/2012 0355   HCT 28.4* 08/04/2012 0955   PLT 191 08/13/2012 0355   PLT 134* 08/04/2012 0955   MCV 82.6 08/13/2012 0355   MCV 84.2 08/04/2012 0955   MCH 27.7 08/13/2012 0355   MCH 29.1 08/04/2012 0955   MCHC 33.5 08/13/2012 0355   MCHC 34.6 08/04/2012 0955   RDW 15.7* 08/13/2012 0355   RDW 14.0 08/04/2012 0955   LYMPHSABS 0.7 08/13/2012 0355   LYMPHSABS 0.3* 08/04/2012 0955   MONOABS 0.5 08/13/2012 0355   MONOABS 0.4 08/04/2012 0955   EOSABS 0.1 08/13/2012 0355   EOSABS 0.1 08/04/2012 0955   BASOSABS 0.0 08/13/2012 0355   BASOSABS 0.0 08/04/2012 0955   CARDIAC ENZYMES Lab Results  Component Value Date   CKTOTAL 196 06/16/2012   CKMB 13.3* 06/16/2012   TROPONINI 16.39* 06/19/2012    Scheduled Meds: . aspirin EC  81 mg Oral Daily  . atorvastatin  80 mg Oral q1800  . carvedilol  6.25 mg Oral BID WC  . citalopram  20 mg Oral Daily  . feeding supplement  237 mL Oral BID BM  . ferrous sulfate  325 mg Oral TID WC  . heparin  5,000 Units Subcutaneous Q8H  . levalbuterol  1.25 mg Nebulization TID  . piperacillin-tazobactam (ZOSYN)  IV  3.375 g Intravenous Q8H  . ramipril  1.25 mg Oral Daily  . Ticagrelor  90 mg Oral BID  . vancomycin  1,250 mg Intravenous Q12H   Continuous Infusions: . sodium chloride     PRN Meds:.acetaminophen, ALPRAZolam, docusate sodium, HYDROcodone-acetaminophen, nitroGLYCERIN, sodium chloride  Assessment/Plan: Pneumonia, now bilateral  Pancytopenia status post chemotherapy  Metastatic adenocarcinoma of pancreas  Coronary artery disease status post anterolateral wall myocardial infarction status post PCI to LAD in the past  Multivessel coronary artery disease  Hypertension  Hypercholesteremia  Acute on Chronic anemia rule out GI loss  History of tobacco abuse  Positive family history of coronary artery disease  Continue antibiotic. Blood transfusion per oncology.   LOS: 2 days    Orpah Cobb  MD  08/13/2012, 12:43 PM

## 2012-08-13 NOTE — Consult Note (Addendum)
Reason for Consult: Hypoxemia in the setting of GGO on CT.  Referring Physician: Dr. Rema Jasmine is an 72 y.o. male with metastatic pancreatic cancer currently on Gemzar/Abraxane with last dose administered 08/04/2012. On [id number] he presented to the ED with fever and chills in the setting of rhinorrhea and a sore throat. In addition, he had a cough productive of whitish sputum. His O2 requirement has gradually increased since he was admitted although he denies SOB. He has noted some swelling in his ankles but it has resolved with elevation of his feet. He denies HA, CP, DOE, PND, orthopnea, abd P, N/V.   Past Medical History  Diagnosis Date  . Anxiety   . Pancreatic cancer metastasized to liver   . MI (myocardial infarction)   . HTN (hypertension)   . Right rotator cuff tear   . Diverticulosis   . Malignant neoplasm of pancreas, part unspecified 07/18/2012    Past Surgical History  Procedure Laterality Date  . Cardiac catheterization    . Coronary stent placement      Family History  Problem Relation Age of Onset  . Cancer Mother     oropharengial  . Heart attack Father   . Ovarian cancer Sister   . Anuerysm Brother     AAA    Social History:  reports that he has been smoking Cigarettes and Cigars.  He has been smoking about 0.00 packs per day. He has never used smokeless tobacco. He reports that he does not drink alcohol or use illicit drugs.  Allergies: No Known Allergies  Medications:  Prior to Admission:  Prescriptions prior to admission  Medication Sig Dispense Refill  . acetaminophen (TYLENOL) 500 MG tablet Take 500-1,000 mg by mouth daily as needed for pain.      Marland Kitchen ALPRAZolam (XANAX) 0.5 MG tablet Take 0.5 mg by mouth 3 (three) times daily as needed for anxiety.      Marland Kitchen aspirin EC 81 MG tablet Take 81 mg by mouth daily.      Marland Kitchen atorvastatin (LIPITOR) 80 MG tablet Take 1 tablet (80 mg total) by mouth daily at 6 PM.  30 tablet  1  . carvedilol  (COREG) 6.25 MG tablet Take 1 tablet (6.25 mg total) by mouth 2 (two) times daily with a meal.  60 tablet  1  . docusate sodium (COLACE) 100 MG capsule Take 100 mg by mouth as needed for constipation.      . lidocaine-prilocaine (EMLA) cream Apply topically as needed. To PAC prn      . ramipril (ALTACE) 1.25 MG capsule Take 1 capsule (1.25 mg total) by mouth daily.  30 capsule  1  . Ticagrelor (BRILINTA) 90 MG TABS tablet Take 1 tablet (90 mg total) by mouth 2 (two) times daily.  60 tablet  1  . citalopram (CELEXA) 20 MG tablet Take 1 tablet (20 mg total) by mouth daily.  30 tablet  3  . HYDROcodone-acetaminophen (NORCO/VICODIN) 5-325 MG per tablet Take 1 tablet by mouth every 6 (six) hours as needed for pain.  50 tablet  2  . nitroGLYCERIN (NITROSTAT) 0.4 MG SL tablet Place 1 tablet (0.4 mg total) under the tongue every 5 (five) minutes x 3 doses as needed for chest pain.  25 tablet  0  . prochlorperazine (COMPAZINE) 10 MG tablet Take 1 tablet (10 mg total) by mouth every 6 (six) hours as needed (nausea).  60 tablet  1   Scheduled: . aspirin EC  81 mg Oral Daily  . atorvastatin  80 mg Oral q1800  . [START ON 08/14/2012] azithromycin  250 mg Oral Q24H  . carvedilol  6.25 mg Oral BID WC  . citalopram  20 mg Oral Daily  . feeding supplement  237 mL Oral BID BM  . ferrous sulfate  325 mg Oral TID WC  . heparin  5,000 Units Subcutaneous Q8H  . levalbuterol  1.25 mg Nebulization TID  . piperacillin-tazobactam (ZOSYN)  IV  3.375 g Intravenous Q8H  . ramipril  1.25 mg Oral Daily  . Ticagrelor  90 mg Oral BID  . vancomycin  1,250 mg Intravenous Q12H   Continuous: . sodium chloride 20 mL/hr (08/13/12 2214)   ZOX:[id number], ALPRAZolam, docusate sodium, HYDROcodone-acetaminophen, nitroGLYCERIN, sodium chloride  Results for orders placed during the hospital encounter of 08/11/12 (from the past 48 hour(s))  BASIC METABOLIC PANEL     Status: Abnormal   Collection Time    08/12/12  5:25 AM       Result Value Range   Sodium 130 (*) 135 - 145 mEq/L   Potassium 3.8  3.5 - 5.1 mEq/L   Chloride 97  96 - 112 mEq/L   CO2 23  19 - 32 mEq/L   Glucose, Bld 108 (*) 70 - 99 mg/dL   BUN 16  6 - 23 mg/dL   Creatinine, Ser 1.47  0.50 - 1.35 mg/dL   Calcium 8.3 (*) 8.4 - 10.5 mg/dL   GFR calc non Af Amer 86 (*) >90 mL/min   GFR calc Af Amer >90  >90 mL/min   Comment:            The eGFR has been calculated     using the CKD EPI equation.     This calculation has not been     validated in all clinical     situations.     eGFR's persistently     <90 mL/min signify     possible Chronic Kidney Disease.  CBC     Status: Abnormal   Collection Time    08/12/12  5:25 AM      Result Value Range   WBC 2.5 (*) 4.0 - 10.5 K/uL   RBC 2.65 (*) 4.22 - 5.81 MIL/uL   Hemoglobin 7.4 (*) 13.0 - 17.0 g/dL   HCT 82.9 (*) 56.2 - 13.0 %   MCV 81.5  78.0 - 100.0 fL   MCH 27.9  26.0 - 34.0 pg   MCHC 34.3  30.0 - 36.0 g/dL   RDW 86.5  78.4 - 69.6 %   Platelets 163  150 - 400 K/uL  CULTURE, EXPECTORATED SPUTUM-ASSESSMENT     Status: None   Collection Time    08/12/12 10:11 AM      Result Value Range   Specimen Description SPUTUM     Special Requests NONE     Sputum evaluation       Value: MICROSCOPIC FINDINGS SUGGEST THAT THIS SPECIMEN IS NOT REPRESENTATIVE OF LOWER RESPIRATORY SECRETIONS. PLEASE RECOLLECT.     RESULT CALLED TO AND VERIFIED WITH GRACE SISON,RN [id number] @ 1157 BY J SCOTTON   Report Status 08/12/2012 FINAL    BASIC METABOLIC PANEL     Status: Abnormal   Collection Time    08/13/12  3:55 AM      Result Value Range   Sodium 133 (*) 135 - 145 mEq/L   Potassium 3.5  3.5 - 5.1 mEq/L   Chloride 100  96 - 112  mEq/L   CO2 24  19 - 32 mEq/L   Glucose, Bld 120 (*) 70 - 99 mg/dL   BUN 14  6 - 23 mg/dL   Creatinine, Ser 1.61  0.50 - 1.35 mg/dL   Calcium 8.4  8.4 - 09.6 mg/dL   GFR calc non Af Amer 84 (*) >90 mL/min   GFR calc Af Amer >90  >90 mL/min   Comment:            The eGFR has been  calculated     using the CKD EPI equation.     This calculation has not been     validated in all clinical     situations.     eGFR's persistently     <90 mL/min signify     possible Chronic Kidney Disease.  CBC WITH DIFFERENTIAL     Status: Abnormal   Collection Time    08/13/12  3:55 AM      Result Value Range   WBC 3.0 (*) 4.0 - 10.5 K/uL   RBC 2.64 (*) 4.22 - 5.81 MIL/uL   Hemoglobin 7.3 (*) 13.0 - 17.0 g/dL   HCT 04.5 (*) 40.9 - 81.1 %   MCV 82.6  78.0 - 100.0 fL   MCH 27.7  26.0 - 34.0 pg   MCHC 33.5  30.0 - 36.0 g/dL   RDW 91.4 (*) 78.2 - 95.6 %   Platelets 191  150 - 400 K/uL   Neutrophils Relative % 55  43 - 77 %   Neutro Abs 1.6 (*) 1.7 - 7.7 K/uL   Lymphocytes Relative 25  12 - 46 %   Lymphs Abs 0.7  0.7 - 4.0 K/uL   Monocytes Relative 17 (*) 3 - 12 %   Monocytes Absolute 0.5  0.1 - 1.0 K/uL   Eosinophils Relative 3  0 - 5 %   Eosinophils Absolute 0.1  0.0 - 0.7 K/uL   Basophils Relative 0  0 - 1 %   Basophils Absolute 0.0  0.0 - 0.1 K/uL  VANCOMYCIN, TROUGH     Status: None   Collection Time    08/13/12  9:55 AM      Result Value Range   Vancomycin Tr 11.2  10.0 - 20.0 ug/mL  PREPARE RBC (CROSSMATCH)     Status: None   Collection Time    08/13/12 10:12 AM      Result Value Range   Order Confirmation ORDER PROCESSED BY BLOOD BANK    TYPE AND SCREEN     Status: None   Collection Time    08/13/12 12:30 PM      Result Value Range   ABO/RH(D) B NEG     Antibody Screen NEG     Sample Expiration 08/16/2012     Unit Number [id number]     Blood Component Type RED CELLS,LR     Unit division 00     Status of Unit ISSUED     Transfusion Status OK TO TRANSFUSE     Crossmatch Result Compatible     Unit Number [id number]     Blood Component Type RED CELLS,LR     Unit division 00     Status of Unit ALLOCATED     Transfusion Status OK TO TRANSFUSE     Crossmatch Result Compatible    ABO/RH     Status: None   Collection Time    08/13/12 12:50 PM       Result Value Range  ABO/RH(D) B NEG    BLOOD GAS, ARTERIAL     Status: Abnormal   Collection Time    08/13/12 10:45 PM      Result Value Range   O2 Content 4.0     Delivery systems NASAL CANNULA     pH, Arterial 7.462 (*) 7.350 - 7.450   pCO2 arterial 30.6 (*) 35.0 - 45.0 mmHg   pO2, Arterial 65.9 (*) 80.0 - 100.0 mmHg   Bicarbonate 21.6  20.0 - 24.0 mEq/L   TCO2 20.2  0 - 100 mmol/L   Acid-base deficit 1.3  0.0 - 2.0 mmol/L   O2 Saturation 92.6     Patient temperature 98.4     Collection site RIGHT RADIAL     Drawn by [id number]     Sample type ARTERIAL     Allens test (pass/fail) PASS  PASS    Dg Chest 2 View  08/13/2012   *RADIOLOGY REPORT*  Clinical Data: 72 year old male with cough and fever.  Abnormal CT scan.  CHEST - 2 VIEW  Comparison: 08/11/2012.  Findings: There is a broad area of hazy and reticular density in the left lung.  Right lower lung involvement.  These are radiographically progressed since 08/11/2012.  Stable lung volumes. Stable right chest Port-A-Cath.  Stable cardiac size and mediastinal contours.  No pneumothorax or pleural effusion. Stable visualized osseous structures.  IMPRESSION: Bilateral (left upper lung predominance and right lower lung predominant) pneumonia with radiographic progression since 08/11/2012 chest films.   Original Report Authenticated By: Erskine Speed, M.D.    Review of Systems  Constitutional: Positive for fever and chills. Negative for weight loss.  HENT: Positive for nosebleeds, congestion and sore throat.   Eyes: Negative.   Respiratory: Positive for cough. Negative for hemoptysis, sputum production, shortness of breath and wheezing.   Cardiovascular: Negative.   Gastrointestinal: Negative.   Genitourinary: Negative.   Musculoskeletal: Negative.   Skin: Negative.   Neurological: Negative.   Psychiatric/Behavioral: Negative.    Blood pressure 112/48, pulse 77, temperature 98.3 F (36.8 C), temperature source Oral, resp. rate 17,  height 5\' 9"  (1.753 m), weight 157 lb 14.4 oz (71.623 kg), SpO2 91.00%. Physical Exam  Constitutional: He is oriented to person, place, and time. He appears well-developed and well-nourished. No distress.  HENT:  Head: Normocephalic and atraumatic.  Mouth/Throat: No oropharyngeal exudate.  Eyes: Conjunctivae are normal. Right eye exhibits no discharge. Left eye exhibits no discharge. No scleral icterus.  Neck: Neck supple. No JVD present. No tracheal deviation present. No thyromegaly present.  Cardiovascular: Normal rate, regular rhythm, normal heart sounds and intact distal pulses.  Exam reveals no gallop and no friction rub.   No murmur heard. Respiratory: Effort normal. No stridor. No respiratory distress. He has no wheezes. He has rales (Bases). He exhibits no tenderness.  GI: Soft. He exhibits no distension. There is no tenderness. There is no guarding.  Musculoskeletal: He exhibits edema (Trace at ankles).  Lymphadenopathy:    He has no cervical adenopathy.  Neurological: He is alert and oriented to person, place, and time.  Skin: Skin is warm and dry. No rash noted. He is not diaphoretic. No erythema. No pallor.    Assessment/Plan: Mr. Davidow is a 72 yo M with metastatic pancreatic cancer and a recent MI now with hypoxemia.  1. Hypoxemia: The differential diagnosis includes conventional bacterial pneunonia, viral pneumonia, ARDS and drug-induced pneumonitis. Given the clinical presentation I favor a viral pneumonititis although I would not narrow the  antibiotics at this time. Unfortunately, there is no easy test to evaluate for acute pneumonitis from a drug reaction; we could consider bronchoscopy but the patient's use of Ticgrelor greatly increases the risk of a bleeding complication. I would favor a more conservative evaluation initially.  Please send a respiratory viral panel; if negative or patient decompensates further would strongly favor bronchoscopy preferably with biopsy if  Ticgrelor can be held  Please collect a repeat sputum sample for culture as the last was rejected   Please continue Vanc/Zosyn/and Azithromycin until all cultures have been negative for at least 48 hrs  Will empirically start Tamiflu although suspicion is low  Wean O2 as tolerated to a goal SpO2 of > 90%  Please target net neutral to slightly negative fluid status   Lawson Isabell R. 08/13/2012, 11:30 PM

## 2012-08-14 ENCOUNTER — Inpatient Hospital Stay (HOSPITAL_COMMUNITY): Payer: Medicare Other

## 2012-08-14 DIAGNOSIS — J189 Pneumonia, unspecified organism: Secondary | ICD-10-CM

## 2012-08-14 DIAGNOSIS — I10 Essential (primary) hypertension: Secondary | ICD-10-CM

## 2012-08-14 DIAGNOSIS — C259 Malignant neoplasm of pancreas, unspecified: Secondary | ICD-10-CM

## 2012-08-14 DIAGNOSIS — C787 Secondary malignant neoplasm of liver and intrahepatic bile duct: Secondary | ICD-10-CM

## 2012-08-14 DIAGNOSIS — J9601 Acute respiratory failure with hypoxia: Secondary | ICD-10-CM

## 2012-08-14 DIAGNOSIS — J9600 Acute respiratory failure, unspecified whether with hypoxia or hypercapnia: Secondary | ICD-10-CM

## 2012-08-14 DIAGNOSIS — T451X5A Adverse effect of antineoplastic and immunosuppressive drugs, initial encounter: Secondary | ICD-10-CM

## 2012-08-14 LAB — COMPREHENSIVE METABOLIC PANEL
Alkaline Phosphatase: 122 U/L — ABNORMAL HIGH (ref 39–117)
BUN: 14 mg/dL (ref 6–23)
Chloride: 99 mEq/L (ref 96–112)
GFR calc Af Amer: >90 mL/min (ref >90)
Glucose, Bld: 110 mg/dL — ABNORMAL HIGH (ref 70–99)
Potassium: 3.5 mEq/L (ref 3.5–5.1)
Total Bilirubin: 0.7 mg/dL (ref 0.3–1.2)

## 2012-08-14 LAB — CBC
HCT: 27.2 % — ABNORMAL LOW (ref 39.0–52.0)
Hemoglobin: 9.2 g/dL — ABNORMAL LOW (ref 13.0–17.0)
RBC: 3.36 MIL/uL — ABNORMAL LOW (ref 4.22–5.81)
WBC: 3.9 10*3/uL — ABNORMAL LOW (ref 4.0–10.5)

## 2012-08-14 LAB — PROCALCITONIN: Procalcitonin: 0.12 ng/mL

## 2012-08-14 MED ORDER — METHYLPREDNISOLONE SODIUM SUCC 125 MG IJ SOLR
80.0000 mg | Freq: Two times a day (BID) | INTRAMUSCULAR | Status: DC
  Administered 2012-08-14 – 2012-08-17 (×7): 80 mg via INTRAVENOUS
  Filled 2012-08-14 (×8): qty 1.28

## 2012-08-14 MED ORDER — MAGNESIUM SULFATE IN D5W 10-5 MG/ML-% IV SOLN
1.0000 g | Freq: Once | INTRAVENOUS | Status: AC
  Administered 2012-08-14: 1 g via INTRAVENOUS
  Filled 2012-08-14: qty 100

## 2012-08-14 MED ORDER — CARVEDILOL 12.5 MG PO TABS
12.5000 mg | ORAL_TABLET | Freq: Two times a day (BID) | ORAL | Status: DC
  Administered 2012-08-15 – 2012-08-16 (×3): 12.5 mg via ORAL
  Filled 2012-08-14 (×4): qty 1

## 2012-08-14 MED ORDER — LEVOFLOXACIN IN D5W 750 MG/150ML IV SOLN
750.0000 mg | INTRAVENOUS | Status: DC
  Administered 2012-08-14 – 2012-08-15 (×2): 750 mg via INTRAVENOUS
  Filled 2012-08-14 (×2): qty 150

## 2012-08-14 NOTE — Progress Notes (Signed)
Pt profile: 89 M with pancreatic cancer recently treated with Gemzar/Abraxane and h/o CAD admitted 6/11 to Cards service with dyspnea, fever, cough. Has progressive diffuse infiltrative pattern on CXR. PCCM asked to see pt 6/13 with worsening hypoxic resp failure   Subj: Mild dyspnea @ rest. No new complaints. Denies pain  Obj: Filed Vitals:   08/14/12 1700  BP: 95/55  Pulse: 69  Temp:   Resp: 25    Gen: mild dyspnea. Pleasant HEENT: WNL Neck: No JVD noted Chest: bibasilar crackles, no wheezes Cardiac: RRR s M WUJ:WJXB, NT, NABS Ext: No edema, warm  BMET    Component Value Date/Time   NA 134* 08/14/2012 0527   NA 134* 08/04/2012 0955   K 3.5 08/14/2012 0527   K 4.5 08/04/2012 0955   CL 99 08/14/2012 0527   CL 103 08/04/2012 0955   CO2 26 08/14/2012 0527   CO2 25 08/04/2012 0955   GLUCOSE 110* 08/14/2012 0527   GLUCOSE 137* 08/04/2012 0955   BUN 14 08/14/2012 0527   BUN 16.7 08/04/2012 0955   CREATININE 0.82 08/14/2012 0527   CREATININE 0.8 08/04/2012 0955   CALCIUM 8.4 08/14/2012 0527   CALCIUM 8.8 08/04/2012 0955   GFRNONAA 86* 08/14/2012 0527   GFRAA >90 08/14/2012 0527    CBC    Component Value Date/Time   WBC 3.9* 08/14/2012 0527   WBC 2.8* 08/04/2012 0955   RBC 3.36* 08/14/2012 0527   RBC 3.37* 08/04/2012 0955   HGB 9.2* 08/14/2012 0527   HGB 9.8* 08/04/2012 0955   HCT 27.2* 08/14/2012 0527   HCT 28.4* 08/04/2012 0955   PLT 204 08/14/2012 0527   PLT 134* 08/04/2012 0955   MCV 81.0 08/14/2012 0527   MCV 84.2 08/04/2012 0955   MCH 27.4 08/14/2012 0527   MCH 29.1 08/04/2012 0955   MCHC 33.8 08/14/2012 0527   MCHC 34.6 08/04/2012 0955   RDW 15.7* 08/14/2012 0527   RDW 14.0 08/04/2012 0955   LYMPHSABS 0.7 08/13/2012 0355   LYMPHSABS 0.3* 08/04/2012 0955   MONOABS 0.5 08/13/2012 0355   MONOABS 0.4 08/04/2012 0955   EOSABS 0.1 08/13/2012 0355   EOSABS 0.1 08/04/2012 0955   BASOSABS 0.0 08/13/2012 0355   BASOSABS 0.0 08/04/2012 0955    CXR:  Increased bilateral infiltrates with confluence in  RUL  IMPRESSION: Acute hypoxic resp failure - infectious PNA vs pneumonitis (chemo induced) vs edema  Favor pneumonitis  PLAN/RECS:  1) check pro BNP, ESR, PCT 2) narrow abx to levofloxacin and cont oseltamivir 3) diuresis to extent permitted by BP and renal failure 4) Begin empiric steroids 6/14 5) follow CXR and clinically   Billy Fischer, MD ; [facility] service Mobile [phone].  After 5:30 PM or weekends, call [phone]

## 2012-08-14 NOTE — Progress Notes (Signed)
Patient alert and oriented.  Sinus tachycardia, rate of about 150bpm, was noted on the cardiac monitoring ECG which was self resolved after six seconds.  Following the sinus tach, the patient entered ventricular trigeminy for about one minute, which then self resolved to sinus rhythm.  Patient's BP was stable.  Patient denied any palpitations, chest pain or shortness of breath.   Dr. Algie Coffer, consulting cardiologist, was notified of cardiac rhythms.  New orders were given.  Will continue to monitor.  Kinnie Feil, RN

## 2012-08-14 NOTE — Progress Notes (Signed)
Progress Note:  Subjective: The patient was transferred to the step down ICU last evening when he developed progressive hypoxia at rest. Please see Dr. Mamie Levers note as well as the note by the critical care physician who consulted. He remains mildly dyspneic at rest. Oxygen saturation greater than or equal to 92% with oxygen increased from 4 to 5 L per minute. Arterial blood gas done last night with a PO2 of 66 mm. No CO2 retention. He is now afebrile. A third antibiotic was added to his regimen. He is now on Zosyn, vancomycin, and erythromycin. Chest radiograph through June 13 shows bilateral pulmonary infiltrates more prominent in the left upper and right lower lobes. These abnormalities are more easily seen on CT scan done on June 11. Today's x-ray pending.  White blood count now recovering from recent chemotherapy up from a nadir of 2300 to today's value of 3900. Hemoglobin up to 9.2 from 7.3 following 2 unit packed red cell transfusion. Platelet count remains normal.     Vitals: Filed Vitals:   08/14/12 0800  BP:   Pulse:   Temp: 99.1 F (37.3 C)  Resp:    Wt Readings from Last 3 Encounters:  08/13/12 160 lb 0.9 oz (72.6 kg)  08/04/12 156 lb 8 oz (70.988 kg)  07/22/12 161 lb 9.6 oz (73.3 kg)     PHYSICAL EXAM:  General mild dyspnea at rest. He is alert and oriented Head: Normal Eyes: Normal Throat: No erythema or exudate Neck: Full range of motion Lymph Nodes: Lungs: Diffuse, dry rales, bilaterally, more prominent left upper lobe and right lower lobe Breasts:  Cardiac: Regular rhythm no murmur Abdominal: Soft, tender in the right upper quadrant, no palpable mass or organomegaly Extremities: No edema or calf tenderness Vascular:  No cyanosis Neurologic grossly normal Skin: No rash or ecchymosis  Labs:   Recent Labs  08/13/12 0355 08/14/12 0527  WBC 3.0* 3.9*  HGB 7.3* 9.2*  HCT 21.8* 27.2*  PLT 191 204    Recent Labs  08/13/12 0355 08/14/12 0527   NA 133* 134*  K 3.5 3.5  CL 100 99  CO2 24 26  GLUCOSE 120* 110*  BUN 14 14  CREATININE 0.87 0.82  CALCIUM 8.4 8.4      Images Studies/Results:   Dg Chest 2 View  08/13/2012   *RADIOLOGY REPORT*  Clinical Data: 72 year old male with cough and fever.  Abnormal CT scan.  CHEST - 2 VIEW  Comparison: 08/11/2012.  Findings: There is a broad area of hazy and reticular density in the left lung.  Right lower lung involvement.  These are radiographically progressed since 08/11/2012.  Stable lung volumes. Stable right chest Port-A-Cath.  Stable cardiac size and mediastinal contours.  No pneumothorax or pleural effusion. Stable visualized osseous structures.  IMPRESSION: Bilateral (left upper lung predominance and right lower lung predominant) pneumonia with radiographic progression since 08/11/2012 chest films.   Original Report Authenticated By: Erskine Speed, M.D.     Patient Active Problem List   Diagnosis Date Noted  . Protein-calorie malnutrition, severe 08/13/2012  . Pneumonia 08/13/2012  . Hypoxemia 08/13/2012  . Malignant neoplasm of pancreas, part unspecified 07/18/2012  . Acute MI, lateral wall, initial episode of care 07/07/2012  . Pancreatic mass 07/07/2012  . ELEVATED BP READING WITHOUT DX HYPERTENSION 12/18/2009  . ABDOMINAL AORTIC ECTASIA 06/18/2009  . ANXIETY STATE, UNSPECIFIED 04/23/2009    Assessment and Plan:  #1. Pancreatic cancer metastatic to liver just diagnosed in May of this year. Recently  started on chemotherapy with a combination of gemcitabine plus Abraxane. Most recent dose on June 4. Too early to assess response.  #2. Bilateral Pulmonary infiltrates, fever, and hypoxia which occurred at the nadir from chemotherapy with associated leukopenia. No absolute granulocytopenia. No infectious exposures. He is a former smoker who stopped 10 years ago. I think it is quite possible that we are dealing with a chemical pneumonitis from the gemcitabine. We cannot rule out  an infectious process at this time. He is covered with broad-spectrum antibiotics. We will continue to monitor his status closely in the intensive care unit setting. Fever has already resolved. I agree with bronchoscopy if situation worsens. I also agree if we can avoid this in view of double antiplatelet agents that we should. I think it would be quite reasonable to put him on a trial of parenteral steroids to treat possible chemical pneumonitis.  #3. Coronary artery disease with recent April 2014 acute myocardial infarction requiring angioplasty and stent placement on double antiplatelet agents.  #4. Anemia secondary to metastatic cancer and acute illness. Stable hemoglobin post transfusion.  #5. Leukopenia related to recent chemotherapy now resolving and this should help improve the situation if the underlying process is an infection.  One of the patient's daughters is present. Status and treatment plan reviewed.    GRANFORTUNA,JAMES M 08/14/2012, 9:21 AM

## 2012-08-14 NOTE — Progress Notes (Signed)
Subjective:  Shortness of breath at 5L oxygen by nasal cannula. Hgb improved to 9.2 T max 99.1. 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  Objective:  Vital Signs in the last 24 hours: Temp:  [97.8 F (36.6 C)-99.1 F (37.3 C)] 99.1 F (37.3 C) (06/14 0800) Pulse Rate:  [61-82] 72 (06/14 0530) Cardiac Rhythm:  [-] Normal sinus rhythm (06/14 0320) Resp:  [17-28] 19 (06/14 0530) BP: (90-138)/(44-99) 119/76 mmHg (06/14 0530) SpO2:  [85 %-98 %] 98 % (06/14 0904) Weight:  [72.6 kg (160 lb 0.9 oz)] 72.6 kg (160 lb 0.9 oz) (06/13 2245)  Physical Exam: BP Readings from Last 1 Encounters:  08/14/12 119/76     Wt Readings from Last 1 Encounters:  08/13/12 72.6 kg (160 lb 0.9 oz)    Weight change:   HEENT: [place]/AT, Eyes-Brown, PERL, EOMI, Conjunctiva-Pale pink, Sclera-Non-icteric Neck: No JVD, No bruit, Trachea midline. Lungs:  Crackles, Bilateral. Cardiac:  Regular rhythm, normal S1 and S2, no S3.  Abdomen:  Soft, non-tender. Extremities:  No edema present. No cyanosis. No clubbing. CNS: AxOx3, Cranial nerves grossly intact, moves all 4 extremities. Right handed. Skin: Warm and dry.   Intake/Output from previous day: 06/13 0701 - 06/14 0700 In: 2381.5 [P.O.:264; I.V.:680; Blood:1050; IV Piggyback:387.5] Out: 1875 [Urine:1875]    Lab Results: BMET    Component Value Date/Time   NA 134* 08/14/2012 0527   NA 134* 08/04/2012 0955   K 3.5 08/14/2012 0527   K 4.5 08/04/2012 0955   CL 99 08/14/2012 0527   CL 103 08/04/2012 0955   CO2 26 08/14/2012 0527   CO2 25 08/04/2012 0955   GLUCOSE 110* 08/14/2012 0527   GLUCOSE 137* 08/04/2012 0955   BUN 14 08/14/2012 0527   BUN 16.7 08/04/2012 0955   CREATININE 0.82 08/14/2012 0527   CREATININE 0.8 08/04/2012 0955   CALCIUM 8.4 08/14/2012 0527   CALCIUM 8.8 08/04/2012 0955   GFRNONAA 86* 08/14/2012 0527   GFRAA >90 08/14/2012 0527   CBC    Component Value Date/Time   WBC 3.9* 08/14/2012 0527   WBC 2.8* 08/04/2012 0955   RBC 3.36* 08/14/2012 0527   RBC 3.37*  08/04/2012 0955   HGB 9.2* 08/14/2012 0527   HGB 9.8* 08/04/2012 0955   HCT 27.2* 08/14/2012 0527   HCT 28.4* 08/04/2012 0955   PLT 204 08/14/2012 0527   PLT 134* 08/04/2012 0955   MCV 81.0 08/14/2012 0527   MCV 84.2 08/04/2012 0955   MCH 27.4 08/14/2012 0527   MCH 29.1 08/04/2012 0955   MCHC 33.8 08/14/2012 0527   MCHC 34.6 08/04/2012 0955   RDW 15.7* 08/14/2012 0527   RDW 14.0 08/04/2012 0955   LYMPHSABS 0.7 08/13/2012 0355   LYMPHSABS 0.3* 08/04/2012 0955   MONOABS 0.5 08/13/2012 0355   MONOABS 0.4 08/04/2012 0955   EOSABS 0.1 08/13/2012 0355   EOSABS 0.1 08/04/2012 0955   BASOSABS 0.0 08/13/2012 0355   BASOSABS 0.0 08/04/2012 0955   CARDIAC ENZYMES Lab Results  Component Value Date   CKTOTAL 196 06/16/2012   CKMB 13.3* 06/16/2012   TROPONINI 16.39* 06/19/2012    Scheduled Meds: . aspirin EC  81 mg Oral Daily  . atorvastatin  80 mg Oral q1800  . carvedilol  6.25 mg Oral BID WC  . citalopram  20 mg Oral Daily  . feeding supplement  237 mL Oral BID BM  . ferrous sulfate  325 mg Oral TID WC  . heparin  5,000 Units Subcutaneous Q8H  . levofloxacin (LEVAQUIN) IV  750 mg Intravenous Q24H  . methylPREDNISolone (SOLU-MEDROL) injection  80 mg Intravenous Q12H  . oseltamivir  75 mg Oral BID  . ramipril  1.25 mg Oral Daily  . Ticagrelor  90 mg Oral BID   Continuous Infusions: . sodium chloride 20 mL/hr (08/13/12 2214)   PRN Meds:.acetaminophen, ALPRAZolam, docusate sodium, HYDROcodone-acetaminophen, nitroGLYCERIN, sodium chloride  Assessment/Plan: Pneumonia, now bilateral  Pancytopenia status post chemotherapy  Metastatic adenocarcinoma of pancreas  Coronary artery disease status post anterolateral wall myocardial infarction status post PCI to LAD in the past  Multivessel coronary artery disease  Hypertension  Hypercholesteremia  Acute on Chronic anemia rule out GI loss  History of tobacco abuse  Positive family history of coronary artery disease  Follow with CCM and oncology.   LOS: 3 days     Orpah Cobb  MD  08/14/2012, 10:41 AM     Shortness of breath at 5 L

## 2012-08-15 ENCOUNTER — Inpatient Hospital Stay (HOSPITAL_COMMUNITY): Payer: Medicare Other

## 2012-08-15 DIAGNOSIS — R509 Fever, unspecified: Secondary | ICD-10-CM

## 2012-08-15 LAB — CBC
HCT: 27.3 % — ABNORMAL LOW (ref 39.0–52.0)
Hemoglobin: 9.2 g/dL — ABNORMAL LOW (ref 13.0–17.0)
MCH: 27.4 pg (ref 26.0–34.0)
MCHC: 33.7 g/dL (ref 30.0–36.0)
RDW: 15.9 % — ABNORMAL HIGH (ref 11.5–15.5)

## 2012-08-15 LAB — TYPE AND SCREEN
ABO/RH(D): B NEG
Unit division: 0

## 2012-08-15 LAB — RESPIRATORY VIRUS PANEL
Influenza A H3: NOT DETECTED
Influenza A: NOT DETECTED
Influenza B: NOT DETECTED
Metapneumovirus: NOT DETECTED
Parainfluenza 1: NOT DETECTED
Respiratory Syncytial Virus A: NOT DETECTED

## 2012-08-15 LAB — COMPREHENSIVE METABOLIC PANEL
BUN: 18 mg/dL (ref 6–23)
Calcium: 9 mg/dL (ref 8.4–10.5)
Creatinine, Ser: 0.71 mg/dL (ref 0.50–1.35)
GFR calc Af Amer: >90 mL/min (ref >90)
Glucose, Bld: 164 mg/dL — ABNORMAL HIGH (ref 70–99)
Total Protein: 5.9 g/dL — ABNORMAL LOW (ref 6.0–8.3)

## 2012-08-15 LAB — MAGNESIUM: Magnesium: 2.4 mg/dL (ref 1.5–2.5)

## 2012-08-15 MED ORDER — FUROSEMIDE 10 MG/ML IJ SOLN
40.0000 mg | Freq: Once | INTRAMUSCULAR | Status: AC
  Administered 2012-08-15: 40 mg via INTRAVENOUS
  Filled 2012-08-15: qty 4

## 2012-08-15 MED ORDER — POTASSIUM CHLORIDE CRYS ER 20 MEQ PO TBCR
40.0000 meq | EXTENDED_RELEASE_TABLET | Freq: Once | ORAL | Status: AC
  Administered 2012-08-15: 40 meq via ORAL
  Filled 2012-08-15: qty 2

## 2012-08-15 MED ORDER — LEVOFLOXACIN 500 MG PO TABS
500.0000 mg | ORAL_TABLET | Freq: Every day | ORAL | Status: DC
  Administered 2012-08-16 – 2012-08-18 (×3): 500 mg via ORAL
  Filled 2012-08-15 (×4): qty 1

## 2012-08-15 NOTE — Progress Notes (Signed)
QT interval 0.46 at time of levaquin and citolapram administration in regards to pharmacy alert.

## 2012-08-15 NOTE — Consult Note (Signed)
[facility] INPATIENT PROGRESS NOTE  Name: Randall [name]      MRN: [id number]    Location: 1233/1233-01  Date: 08/15/2012 Time:9:19 AM   Subjective: Interval History:Ramal P Thomley reported feeling much better than yesterday.  His SOB is much better.  He is able to speak full sentence without SOB.  She still has dry cough but it is improving as well.  He has low grade fever.  He denied mucositis, chest pain, nausea/vomiting, bleeding.  He has mild abdominal pain which is relieved with pain med.   Objective: Vital signs in last 24 hours: Temp:  [96.2 F (35.7 C)-100.7 F (38.2 C)] 97.4 F (36.3 C) (06/15 0750) Pulse Rate:  [61-150] 66 (06/15 0750) Resp:  [11-29] 22 (06/15 0750) BP: (88-116)/(44-69) 116/69 mmHg (06/15 0750) SpO2:  [90 %-96 %] 93 % (06/15 0750) FiO2 (%):  [40 %] 40 % (06/15 0355)     PHYSICAL EXAM: Gen: Well-nourished, in no acute distress. Eyes: No scleral icterus or jaundice. ENT: There was no oropharyngeal lesions. Neck was supple without thyromegaly. Lymphatics: Negative for cervical, supraclavicular, axillary, or inguinal adenopathy.  Respiratory: Lungs showed bilateral diffuse inspiratory crackles.  Cardiovascular: normal heart rate and rhythm; S1/S2; without murmur, rubs, or gallop. There was no pedal edema. GI: Abdomen was soft, flat, nontender, nondistended, without organomegaly.  Neuro exam was nonfocal. Patient was alert and oriented. Attention was good.     Studies/Results: Results for orders placed during the hospital encounter of 08/11/12 (from the past 48 hour(s))  VANCOMYCIN, TROUGH     Status: None   Collection Time    08/13/12  9:55 AM      Result Value Range   Vancomycin Tr 11.2  10.0 - 20.0 ug/mL  PREPARE RBC (CROSSMATCH)     Status: None   Collection Time    08/13/12 10:12 AM      Result Value Range   Order Confirmation ORDER PROCESSED BY BLOOD BANK    TYPE AND SCREEN     Status: None   Collection Time    08/13/12 12:30 PM       Result Value Range   ABO/RH(D) B NEG     Antibody Screen NEG     Sample Expiration 08/16/2012     Unit Number [id number]     Blood Component Type RED CELLS,LR     Unit division 00     Status of Unit ISSUED,FINAL     Transfusion Status OK TO TRANSFUSE     Crossmatch Result Compatible     Unit Number [id number]     Blood Component Type RED CELLS,LR     Unit division 00     Status of Unit ISSUED,FINAL     Transfusion Status OK TO TRANSFUSE     Crossmatch Result Compatible    ABO/RH     Status: None   Collection Time    08/13/12 12:50 PM      Result Value Range   ABO/RH(D) B NEG    BLOOD GAS, ARTERIAL     Status: Abnormal   Collection Time    08/13/12 10:45 PM      Result Value Range   O2 Content 4.0     Delivery systems NASAL CANNULA     pH, Arterial 7.462 (*) 7.350 - 7.450   pCO2 arterial 30.6 (*) 35.0 - 45.0 mmHg   pO2, Arterial 65.9 (*) 80.0 - 100.0 mmHg   Bicarbonate 21.6  20.0 - 24.0 mEq/L  TCO2 20.2  0 - 100 mmol/L   Acid-base deficit 1.3  0.0 - 2.0 mmol/L   O2 Saturation 92.6     Patient temperature 98.4     Collection site RIGHT RADIAL     Drawn by [id number]     Sample type ARTERIAL     Allens test (pass/fail) PASS  PASS  RESPIRATORY VIRUS PANEL     Status: None   Collection Time    08/14/12 12:49 AM      Result Value Range   Source - RVPAN NOSE     Respiratory Syncytial Virus A NOT DETECTED     Respiratory Syncytial Virus B NOT DETECTED     Influenza A NOT DETECTED     Influenza B NOT DETECTED     Parainfluenza 1 NOT DETECTED     Parainfluenza 2 NOT DETECTED     Parainfluenza 3 NOT DETECTED     Metapneumovirus NOT DETECTED     Rhinovirus NOT DETECTED     Adenovirus NOT DETECTED     Influenza A H1 NOT DETECTED     Influenza A H3 NOT DETECTED     Comment: (NOTE)           Normal Reference Range for each Analyte: NOT DETECTED     Testing performed using the Luminex xTAG Respiratory Viral Panel test     kit.     This test was developed and its  performance characteristics determined     by [employer]. It has not been cleared or approved by the [place]     Food and Drug Administration. This test is used for clinical purposes.     It should not be regarded as investigational or for research. This     laboratory is certified under the Clinical Laboratory Improvement     Amendments of 1988 (CLIA) as qualified to perform high complexity     clinical laboratory testing.  CBC     Status: Abnormal   Collection Time    08/14/12  5:27 AM      Result Value Range   WBC 3.9 (*) 4.0 - 10.5 K/uL   RBC 3.36 (*) 4.22 - 5.81 MIL/uL   Hemoglobin 9.2 (*) 13.0 - 17.0 g/dL   Comment: POST TRANSFUSION SPECIMEN     DELTA CHECK NOTED   HCT 27.2 (*) 39.0 - 52.0 %   MCV 81.0  78.0 - 100.0 fL   MCH 27.4  26.0 - 34.0 pg   MCHC 33.8  30.0 - 36.0 g/dL   RDW 62.1 (*) 30.8 - 65.7 %   Platelets 204  150 - 400 K/uL  COMPREHENSIVE METABOLIC PANEL     Status: Abnormal   Collection Time    08/14/12  5:27 AM      Result Value Range   Sodium 134 (*) 135 - 145 mEq/L   Potassium 3.5  3.5 - 5.1 mEq/L   Chloride 99  96 - 112 mEq/L   CO2 26  19 - 32 mEq/L   Glucose, Bld 110 (*) 70 - 99 mg/dL   BUN 14  6 - 23 mg/dL   Creatinine, Ser 8.46  0.50 - 1.35 mg/dL   Calcium 8.4  8.4 - 96.2 mg/dL   Total Protein 5.7 (*) 6.0 - 8.3 g/dL   Albumin 2.3 (*) 3.5 - 5.2 g/dL   AST 48 (*) 0 - 37 U/L   ALT 71 (*) 0 - 53 U/L   Alkaline Phosphatase 122 (*) 39 -  117 U/L   Total Bilirubin 0.7  0.3 - 1.2 mg/dL   GFR calc non Af Amer 86 (*) >90 mL/min   GFR calc Af Amer >90  >90 mL/min   Comment:            The eGFR has been calculated     using the CKD EPI equation.     This calculation has not been     validated in all clinical     situations.     eGFR's persistently     <90 mL/min signify     possible Chronic Kidney Disease.  SEDIMENTATION RATE     Status: Abnormal   Collection Time    08/14/12  5:27 AM      Result Value Range   Sed Rate 34 (*) 0 - 16 mm/hr   PROCALCITONIN     Status: None   Collection Time    08/14/12  5:27 AM      Result Value Range   Procalcitonin 0.12     Comment:            Interpretation:     PCT (Procalcitonin) <= 0.5 ng/mL:     Systemic infection (sepsis) is not likely.     Local bacterial infection is possible.     (NOTE)             ICU PCT Algorithm               Non ICU PCT Algorithm        ----------------------------     ------------------------------             PCT < 0.25 ng/mL                 PCT < 0.1 ng/mL         Stopping of antibiotics            Stopping of antibiotics           strongly encouraged.               strongly encouraged.        ----------------------------     ------------------------------           PCT level decrease by               PCT < 0.25 ng/mL           >= 80% from peak PCT           OR PCT 0.25 - 0.5 ng/mL          Stopping of antibiotics                                                 encouraged.         Stopping of antibiotics               encouraged.        ----------------------------     ------------------------------           PCT level decrease by              PCT >= 0.25 ng/mL           < 80% from peak PCT            AND PCT >= 0.5 ng/mL  Continuing antibiotics                                                  encouraged.           Continuing antibiotics                encouraged.        ----------------------------     ------------------------------         PCT level increase compared          PCT > 0.5 ng/mL             with peak PCT AND              PCT >= 0.5 ng/mL             Escalation of antibiotics                                              strongly encouraged.          Escalation of antibiotics            strongly encouraged.  PRO B NATRIURETIC PEPTIDE     Status: Abnormal   Collection Time    08/14/12  5:27 AM      Result Value Range   Pro B Natriuretic peptide (BNP) 2247.0 (*) 0 - 125 pg/mL  CBC     Status: Abnormal   Collection Time     08/15/12  3:51 AM      Result Value Range   WBC 4.7  4.0 - 10.5 K/uL   RBC 3.36 (*) 4.22 - 5.81 MIL/uL   Hemoglobin 9.2 (*) 13.0 - 17.0 g/dL   HCT 45.4 (*) 09.8 - 11.9 %   MCV 81.3  78.0 - 100.0 fL   MCH 27.4  26.0 - 34.0 pg   MCHC 33.7  30.0 - 36.0 g/dL   RDW 14.7 (*) 82.9 - 56.2 %   Platelets 226  150 - 400 K/uL  COMPREHENSIVE METABOLIC PANEL     Status: Abnormal   Collection Time    08/15/12  3:51 AM      Result Value Range   Sodium 136  135 - 145 mEq/L   Potassium 4.0  3.5 - 5.1 mEq/L   Chloride 103  96 - 112 mEq/L   CO2 26  19 - 32 mEq/L   Glucose, Bld 164 (*) 70 - 99 mg/dL   BUN 18  6 - 23 mg/dL   Creatinine, Ser 1.30  0.50 - 1.35 mg/dL   Calcium 9.0  8.4 - 86.5 mg/dL   Total Protein 5.9 (*) 6.0 - 8.3 g/dL   Albumin 2.3 (*) 3.5 - 5.2 g/dL   AST 58 (*) 0 - 37 U/L   ALT 85 (*) 0 - 53 U/L   Alkaline Phosphatase 145 (*) 39 - 117 U/L   Total Bilirubin 0.4  0.3 - 1.2 mg/dL   GFR calc non Af Amer >90  >90 mL/min   GFR calc Af Amer >90  >90 mL/min   Comment:            The eGFR has been calculated     using the CKD EPI equation.  This calculation has not been     validated in all clinical     situations.     eGFR's persistently     <90 mL/min signify     possible Chronic Kidney Disease.  MAGNESIUM     Status: None   Collection Time    08/15/12  3:51 AM      Result Value Range   Magnesium 2.4  1.5 - 2.5 mg/dL   Dg Chest 2 View  9/81/1914   *RADIOLOGY REPORT*  Clinical Data: 73 year old male with cough and fever.  Abnormal CT scan.  CHEST - 2 VIEW  Comparison: 08/11/2012.  Findings: There is a broad area of hazy and reticular density in the left lung.  Right lower lung involvement.  These are radiographically progressed since 08/11/2012.  Stable lung volumes. Stable right chest Port-A-Cath.  Stable cardiac size and mediastinal contours.  No pneumothorax or pleural effusion. Stable visualized osseous structures.  IMPRESSION: Bilateral (left upper lung predominance  and right lower lung predominant) pneumonia with radiographic progression since 08/11/2012 chest films.   Original Report Authenticated By: Erskine Speed, M.D.   Dg Chest Port 1 View  08/15/2012   *RADIOLOGY REPORT*  Clinical Data: Acute respiratory failure with hypoxia.  Metastatic pancreatic carcinoma.  PORTABLE CHEST - 1 VIEW  Comparison: 08/14/2012  Findings: Bilateral pulmonary air space disease is again seen with predominant involvement right upper lobe.  This is unchanged.  No evidence of pneumothorax or pleural effusion.  Right-sided power port remains in appropriate position.  IMPRESSION: No significant change in bilateral airspace disease with right upper lobe predominance.   Original Report Authenticated By: Myles Rosenthal, M.D.   Dg Chest Port 1v Same Day  08/14/2012   *RADIOLOGY REPORT*  Clinical Data: Shortness of breath and follow up pulmonary infiltrates.  PORTABLE CHEST - 1 VIEW SAME DAY  Comparison: 08/13/2012  Findings: Port-A-Cath tip is in the SVC region and unchanged. There are increased bilateral airspace densities, particularly in the right lung.  Heart size is stable.  IMPRESSION: Worsening bilateral airspace disease.   Original Report Authenticated By: Richarda Overlie, M.D.     MEDICATIONS: reviewed.     Assessment/Plan:  1.  Pancreas cancer. 2.  Diffuse bilateral interstitial infiltrate from either Gemzar-induced pneumonitis or atypical pneumonia. 3.  Chemo-induced anemia. 4.  Depression. 5.  HTN. 6.  History of CAD.     Continue empiric antimicrobials with Levaquin and Tamiflu.  Continue empiric IV Solumedrol with potential conversion to a slow oral taper of Prednisone tomorrow if he continues to improve.

## 2012-08-15 NOTE — Progress Notes (Signed)
Pt profile: 72 M with pancreatic cancer recently treated with Gemzar/Abraxane and h/o CAD admitted 6/11 to Cards service with dyspnea, fever, cough. Has progressive diffuse infiltrative pattern on CXR. PCCM asked to see pt 6/13 with worsening hypoxic resp failure   Subj: Improved dyspnea @ rest. No new complaints. Denies pain  Obj: Filed Vitals:   08/15/12 1310  BP: 107/59  Pulse: 58  Temp:   Resp: 15    Gen: NAD HEENT: WNL Neck: No JVD noted Chest: bibasilar crackles, no wheezes Cardiac: RRR s M ZOX:WRUE, NT, NABS Ext: No edema, warm  BMET    Component Value Date/Time   NA 136 08/15/2012 0351   NA 134* 08/04/2012 0955   K 4.0 08/15/2012 0351   K 4.5 08/04/2012 0955   CL 103 08/15/2012 0351   CL 103 08/04/2012 0955   CO2 26 08/15/2012 0351   CO2 25 08/04/2012 0955   GLUCOSE 164* 08/15/2012 0351   GLUCOSE 137* 08/04/2012 0955   BUN 18 08/15/2012 0351   BUN 16.7 08/04/2012 0955   CREATININE 0.71 08/15/2012 0351   CREATININE 0.8 08/04/2012 0955   CALCIUM 9.0 08/15/2012 0351   CALCIUM 8.8 08/04/2012 0955   GFRNONAA >90 08/15/2012 0351   GFRAA >90 08/15/2012 0351    CBC    Component Value Date/Time   WBC 4.7 08/15/2012 0351   WBC 2.8* 08/04/2012 0955   RBC 3.36* 08/15/2012 0351   RBC 3.37* 08/04/2012 0955   HGB 9.2* 08/15/2012 0351   HGB 9.8* 08/04/2012 0955   HCT 27.3* 08/15/2012 0351   HCT 28.4* 08/04/2012 0955   PLT 226 08/15/2012 0351   PLT 134* 08/04/2012 0955   MCV 81.3 08/15/2012 0351   MCV 84.2 08/04/2012 0955   MCH 27.4 08/15/2012 0351   MCH 29.1 08/04/2012 0955   MCHC 33.7 08/15/2012 0351   MCHC 34.6 08/04/2012 0955   RDW 15.9* 08/15/2012 0351   RDW 14.0 08/04/2012 0955   LYMPHSABS 0.7 08/13/2012 0355   LYMPHSABS 0.3* 08/04/2012 0955   MONOABS 0.5 08/13/2012 0355   MONOABS 0.4 08/04/2012 0955   EOSABS 0.1 08/13/2012 0355   EOSABS 0.1 08/04/2012 0955   BASOSABS 0.0 08/13/2012 0355   BASOSABS 0.0 08/04/2012 0955    CXR:  NSC bilateral infiltrates with confluence in RUL  IMPRESSION: Acute hypoxic  resp failure - infectious PNA vs pneumonitis (chemo induced) vs edema  Favor pneumonitis. Pro BNP 2247, ESR 34, PCT 0.12  PLAN/RECS:  1) Cont empiric steroids 2) Cont levofloxacin and oseltamivir 3) diuresis to extent permitted by BP and renal failure 4) follow CXR intermittently   Billy Fischer, MD ; [facility] service Mobile [phone].  After 5:30 PM or weekends, call [phone]

## 2012-08-15 NOTE — Progress Notes (Signed)
Subjective:  Clinically improved. Less short of breath.  Objective:  Vital Signs in the last 24 hours: Temp:  [96.2 F (35.7 C)-97.4 F (36.3 C)] 97.4 F (36.3 C) (06/15 0750) Pulse Rate:  [61-150] 66 (06/15 0750) Cardiac Rhythm:  [-] Normal sinus rhythm;Bundle branch block (06/15 0750) Resp:  [11-26] 22 (06/15 0750) BP: (88-116)/(47-69) 116/69 mmHg (06/15 0750) SpO2:  [90 %-96 %] 93 % (06/15 0750) FiO2 (%):  [40 %] 40 % (06/15 0355)  Physical Exam: BP Readings from Last 1 Encounters:  08/15/12 116/69     Wt Readings from Last 1 Encounters:  08/13/12 72.6 kg (160 lb 0.9 oz)    Weight change:   HEENT: [place]/AT, Eyes-Blue, PERL, EOMI, Conjunctiva-Pink, Sclera-Non-icteric Neck: No JVD, No bruit, Trachea midline. Lungs:  Crackles, Bilateral. Cardiac:  Regular rhythm, normal S1 and S2, no S3.  Abdomen:  Soft, non-tender. Extremities:  No edema present. No cyanosis. No clubbing. CNS: AxOx3, Cranial nerves grossly intact, moves all 4 extremities. Right handed. Skin: Warm and dry.   Intake/Output from previous day: 06/14 0701 - 06/15 0700 In: 1860 [P.O.:1020; I.V.:590; IV Piggyback:250] Out: 2125 [Urine:2125]    Lab Results: BMET    Component Value Date/Time   NA 136 08/15/2012 0351   NA 134* 08/04/2012 0955   K 4.0 08/15/2012 0351   K 4.5 08/04/2012 0955   CL 103 08/15/2012 0351   CL 103 08/04/2012 0955   CO2 26 08/15/2012 0351   CO2 25 08/04/2012 0955   GLUCOSE 164* 08/15/2012 0351   GLUCOSE 137* 08/04/2012 0955   BUN 18 08/15/2012 0351   BUN 16.7 08/04/2012 0955   CREATININE 0.71 08/15/2012 0351   CREATININE 0.8 08/04/2012 0955   CALCIUM 9.0 08/15/2012 0351   CALCIUM 8.8 08/04/2012 0955   GFRNONAA >90 08/15/2012 0351   GFRAA >90 08/15/2012 0351   CBC    Component Value Date/Time   WBC 4.7 08/15/2012 0351   WBC 2.8* 08/04/2012 0955   RBC 3.36* 08/15/2012 0351   RBC 3.37* 08/04/2012 0955   HGB 9.2* 08/15/2012 0351   HGB 9.8* 08/04/2012 0955   HCT 27.3* 08/15/2012 0351   HCT 28.4* 08/04/2012  0955   PLT 226 08/15/2012 0351   PLT 134* 08/04/2012 0955   MCV 81.3 08/15/2012 0351   MCV 84.2 08/04/2012 0955   MCH 27.4 08/15/2012 0351   MCH 29.1 08/04/2012 0955   MCHC 33.7 08/15/2012 0351   MCHC 34.6 08/04/2012 0955   RDW 15.9* 08/15/2012 0351   RDW 14.0 08/04/2012 0955   LYMPHSABS 0.7 08/13/2012 0355   LYMPHSABS 0.3* 08/04/2012 0955   MONOABS 0.5 08/13/2012 0355   MONOABS 0.4 08/04/2012 0955   EOSABS 0.1 08/13/2012 0355   EOSABS 0.1 08/04/2012 0955   BASOSABS 0.0 08/13/2012 0355   BASOSABS 0.0 08/04/2012 0955   CARDIAC ENZYMES Lab Results  Component Value Date   CKTOTAL 196 06/16/2012   CKMB 13.3* 06/16/2012   TROPONINI 16.39* 06/19/2012    Scheduled Meds: . aspirin EC  81 mg Oral Daily  . atorvastatin  80 mg Oral q1800  . carvedilol  12.5 mg Oral Q12H  . citalopram  20 mg Oral Daily  . feeding supplement  237 mL Oral BID BM  . ferrous sulfate  325 mg Oral TID WC  . furosemide  40 mg Intravenous Once  . heparin  5,000 Units Subcutaneous Q8H  . [START ON 08/16/2012] levofloxacin  500 mg Oral Daily  . methylPREDNISolone (SOLU-MEDROL) injection  80 mg Intravenous  Q12H  . oseltamivir  75 mg Oral BID  . potassium chloride  40 mEq Oral Once  . ramipril  1.25 mg Oral Daily  . Ticagrelor  90 mg Oral BID   Continuous Infusions: . sodium chloride 20 mL/hr at 08/14/12 2154   PRN Meds:.acetaminophen, ALPRAZolam, docusate sodium, HYDROcodone-acetaminophen, nitroGLYCERIN, sodium chloride  Assessment/Plan: Pneumonia, now bilateral  Pancytopenia status post chemotherapy  Metastatic adenocarcinoma of pancreas  Coronary artery disease status post anterolateral wall myocardial infarction status post PCI to LAD in the past  Multivessel coronary artery disease  Hypertension  Hypercholesteremia  Acute on Chronic anemia rule out GI loss  History of tobacco abuse  Positive family history of coronary artery disease  Continue medical treatment.   LOS: 4 days    Orpah Cobb  MD  08/15/2012, 12:24  PM

## 2012-08-16 ENCOUNTER — Encounter (HOSPITAL_COMMUNITY): Payer: Medicare Other

## 2012-08-16 LAB — PROCALCITONIN: Procalcitonin: <0.1 ng/mL

## 2012-08-16 LAB — PRO B NATRIURETIC PEPTIDE: Pro B Natriuretic peptide (BNP): 2332 pg/mL — ABNORMAL HIGH (ref 0–125)

## 2012-08-16 MED ORDER — ATORVASTATIN CALCIUM 40 MG PO TABS
40.0000 mg | ORAL_TABLET | Freq: Every day | ORAL | Status: DC
  Administered 2012-08-16 – 2012-08-18 (×3): 40 mg via ORAL
  Filled 2012-08-16 (×3): qty 1

## 2012-08-16 MED ORDER — ENOXAPARIN SODIUM 40 MG/0.4ML ~~LOC~~ SOLN
40.0000 mg | SUBCUTANEOUS | Status: DC
  Administered 2012-08-17 – 2012-08-18 (×2): 40 mg via SUBCUTANEOUS
  Filled 2012-08-16 (×3): qty 0.4

## 2012-08-16 MED ORDER — POTASSIUM CHLORIDE CRYS ER 20 MEQ PO TBCR
20.0000 meq | EXTENDED_RELEASE_TABLET | Freq: Two times a day (BID) | ORAL | Status: DC
  Administered 2012-08-16 (×2): 20 meq via ORAL
  Filled 2012-08-16 (×4): qty 1

## 2012-08-16 MED ORDER — FUROSEMIDE 10 MG/ML IJ SOLN
40.0000 mg | Freq: Once | INTRAMUSCULAR | Status: AC
  Administered 2012-08-16: 40 mg via INTRAVENOUS
  Filled 2012-08-16: qty 4

## 2012-08-16 MED ORDER — SPIRONOLACTONE 25 MG PO TABS
25.0000 mg | ORAL_TABLET | Freq: Every day | ORAL | Status: DC
  Administered 2012-08-16 – 2012-08-18 (×3): 25 mg via ORAL
  Filled 2012-08-16 (×3): qty 1

## 2012-08-16 MED ORDER — CARVEDILOL 6.25 MG PO TABS
6.2500 mg | ORAL_TABLET | Freq: Two times a day (BID) | ORAL | Status: DC
  Administered 2012-08-16 – 2012-08-18 (×4): 6.25 mg via ORAL
  Filled 2012-08-16 (×5): qty 1

## 2012-08-16 MED ORDER — ENOXAPARIN SODIUM 40 MG/0.4ML ~~LOC~~ SOLN: 40.0000 mg | SUBCUTANEOUS | Status: DC

## 2012-08-16 NOTE — Progress Notes (Signed)
Received from stepdown, alert and oriented, ambulatory, no complaints of any distress. Patient ambulate in the hallway with daughter.Right port infusing with NS.

## 2012-08-16 NOTE — Progress Notes (Signed)
ANTIBIOTIC CONSULT NOTE - Follow Up  Pharmacy Consult for Vanco and Zosyn  Indication: PNA  No Known Allergies  Patient Measurements: Height: 5\' 8"  (172.7 cm) Weight: 159 lb (72.122 kg) IBW/kg (Calculated) : 68.4  Vital Signs: Temp: 97.5 F (36.4 C) (06/16 0000) Temp src: Oral (06/16 0000) BP: 108/57 mmHg (06/16 0526) Pulse Rate: 60 (06/16 0526)  Labs:  Recent Labs  08/14/12 0527 08/15/12 0351  WBC 3.9* 4.7  HGB 9.2* 9.2*  PLT 204 226  CREATININE 0.82 0.71     Medications:  Anti-infectives   Start     Dose/Rate Route Frequency Ordered Stop   08/16/12 1000  levofloxacin (LEVAQUIN) tablet 500 mg     500 mg Oral Daily 08/15/12 1031     08/14/12 1800  azithromycin (ZITHROMAX) tablet 250 mg  Status:  Discontinued     250 mg Oral Every 24 hours 08/13/12 2002 08/14/12 1035   08/14/12 1100  levofloxacin (LEVAQUIN) IVPB 750 mg  Status:  Discontinued     750 mg 100 mL/hr over 90 Minutes Intravenous Every 24 hours 08/14/12 1035 08/15/12 1031   08/14/12 0000  oseltamivir (TAMIFLU) capsule 75 mg     75 mg Oral 2 times daily 08/13/12 2355 08/18/12 2159   08/13/12 2100  azithromycin (ZITHROMAX) tablet 500 mg     500 mg Oral Daily 08/13/12 2002 08/13/12 2216   08/13/12 1800  vancomycin (VANCOCIN) 1,250 mg in sodium chloride 0.9 % 250 mL IVPB  Status:  Discontinued     1,250 mg 166.7 mL/hr over 90 Minutes Intravenous Every 12 hours 08/13/12 1113 08/14/12 1035   08/11/12 2200  vancomycin (VANCOCIN) IVPB 1000 mg/200 mL premix     1,000 mg 200 mL/hr over 60 Minutes Intravenous Every 12 hours 08/11/12 1346 08/13/12 1123   08/11/12 1500  piperacillin-tazobactam (ZOSYN) IVPB 3.375 g  Status:  Discontinued     3.375 g 12.5 mL/hr over 240 Minutes Intravenous 3 times per day 08/11/12 1346 08/14/12 1035   08/11/12 0930  levofloxacin (LEVAQUIN) IVPB 500 mg     500 mg 100 mL/hr over 60 Minutes Intravenous  Once 08/11/12 0916 08/11/12 1025   08/11/12 0930  piperacillin-tazobactam (ZOSYN)  IVPB 3.375 g     3.375 g 100 mL/hr over 30 Minutes Intravenous  Once 08/11/12 0923 08/11/12 1053   08/11/12 0930  vancomycin (VANCOCIN) IVPB 1000 mg/200 mL premix     1,000 mg 200 mL/hr over 60 Minutes Intravenous  Once 08/11/12 1610 08/11/12 1228     Assessment: 72 yo M admitted 08/11/12 with probable B/L PNA and pancytopenia s/p chemo.   6/11 Levaquin 500mg  x1 6/11 >> Zosyn >> 6/14 6/11 >> Vanco >> 6/14 6/13 >> Azithro >> 6/14 6/14 >> Levaquin >> 6/14 >> Tamiflu  Tmax: Afeb WBCs: Rising (recent chemo), now 4.7 Renal: SCr = 0.71, CrCl~77-80  6/11 blood: NGTD 6/12 sputum: needs to be re-collected 6/14: respiratory virus panel - negative   Goal of Therapy:  Vancomycin trough level 15-20 mcg/ml  Plan:  Day #3 levofloxacin/tamiflu   Levofloxacin changed to PO this am, no changes needed for renal function, plan is 7 days of tx per CCM notes.   Could increase dose to 750mg    T[place]fu dose appropriate for renal fx  Follow at distance as not adjustments needed  Juliette Alcide, PharmD, BCPS.   Pager: [phone] 08/16/2012,11:56 AM

## 2012-08-16 NOTE — Progress Notes (Signed)
Pt walked around unit 3 times wearing 6L [place]. No dyspnea with exertion.

## 2012-08-16 NOTE — Progress Notes (Signed)
[id number]/[id number] Earlene Plater, RN, BSN, CCM:  CHART REVIEWED AND UPDATED.  Next chart review due on [id number]. NO DISCHARGE NEEDS PRESENT AT THIS TIME. CASE MANAGEMENT [phone]

## 2012-08-16 NOTE — Progress Notes (Signed)
Pt profile: 10 M with pancreatic cancer recently treated with Gemzar/Abraxane and h/o CAD admitted 6/11 to Cards service with dyspnea, fever, cough. Has progressive diffuse infiltrative pattern on CXR. PCCM asked to see pt 6/13 with worsening hypoxic resp failure   Subj: Improved dyspnea @ rest. No new complaints. Denies pain. Walked three laps in hallway  Obj: Filed Vitals:   08/16/12 0526  BP: 108/57  Pulse: 60  Temp:   Resp: 18    Gen: NAD HEENT: WNL Neck: supple, No JVD noted Chest: Clear bilaterally, no wheezes Cardiac: RRR s M ZOX:WRUE, NT, NABS Ext: Lower ext edema bilaterally, warm, pulses palp  BMET    Component Value Date/Time   NA 136 08/15/2012 0351   NA 134* 08/04/2012 0955   K 4.0 08/15/2012 0351   K 4.5 08/04/2012 0955   CL 103 08/15/2012 0351   CL 103 08/04/2012 0955   CO2 26 08/15/2012 0351   CO2 25 08/04/2012 0955   GLUCOSE 164* 08/15/2012 0351   GLUCOSE 137* 08/04/2012 0955   BUN 18 08/15/2012 0351   BUN 16.7 08/04/2012 0955   CREATININE 0.71 08/15/2012 0351   CREATININE 0.8 08/04/2012 0955   CALCIUM 9.0 08/15/2012 0351   CALCIUM 8.8 08/04/2012 0955   GFRNONAA >90 08/15/2012 0351   GFRAA >90 08/15/2012 0351    CBC    Component Value Date/Time   WBC 4.7 08/15/2012 0351   WBC 2.8* 08/04/2012 0955   RBC 3.36* 08/15/2012 0351   RBC 3.37* 08/04/2012 0955   HGB 9.2* 08/15/2012 0351   HGB 9.8* 08/04/2012 0955   HCT 27.3* 08/15/2012 0351   HCT 28.4* 08/04/2012 0955   PLT 226 08/15/2012 0351   PLT 134* 08/04/2012 0955   MCV 81.3 08/15/2012 0351   MCV 84.2 08/04/2012 0955   MCH 27.4 08/15/2012 0351   MCH 29.1 08/04/2012 0955   MCHC 33.7 08/15/2012 0351   MCHC 34.6 08/04/2012 0955   RDW 15.9* 08/15/2012 0351   RDW 14.0 08/04/2012 0955   LYMPHSABS 0.7 08/13/2012 0355   LYMPHSABS 0.3* 08/04/2012 0955   MONOABS 0.5 08/13/2012 0355   MONOABS 0.4 08/04/2012 0955   EOSABS 0.1 08/13/2012 0355   EOSABS 0.1 08/04/2012 0955   BASOSABS 0.0 08/13/2012 0355   BASOSABS 0.0 08/04/2012 0955    CXR:  6/15 - No  significant change in bilateral airspace disease with right upper lobe predominance.    IMPRESSION: Acute hypoxic resp failure - infectious PNA vs pneumonitis (chemo induced) vs edema - Favor pneumonitis and edema. Pro BNP remains elevated at 2332, overall is 1.2L positive for LOS.  PCT negative, ESR 34.  PLAN/RECS:  1) Cont empiric steroids 2) Cont levofloxacin and oseltamivir - will need 5 day course of oseltamivir and seven days of levofloxacin 3) Can d/c droplet precautions 4) Will repeat dose of lasix  5) Supplement K 6) PA and lateral xray tomorrow am 7) Regular bed, out to triad    Billy Fischer, MD ; [facility] service Mobile [phone].  After 5:30 PM or weekends, call [phone]

## 2012-08-16 NOTE — Progress Notes (Signed)
Subjective:  Patient denies any chest pain states his cough and breathing has improved. Chest x-ray showed persistent bilateral infiltrate with right upper lobe prominent infiltrate.  Objective:  Vital Signs in the last 24 hours: Temp:  [97.3 F (36.3 C)-97.6 F (36.4 C)] 97.3 F (36.3 C) (06/16 1412) Pulse Rate:  [60-76] 76 (06/16 1412) Resp:  [15-21] 20 (06/16 1412) BP: (98-133)/(57-80) 119/61 mmHg (06/16 1412) SpO2:  [91 %-93 %] 92 % (06/16 1412) FiO2 (%):  [40 %] 40 % (06/16 0306) Weight:  [72.122 kg (159 lb)] 72.122 kg (159 lb) (06/16 0526)  Intake/Output from previous day: 06/15 0701 - 06/16 0700 In: 710 [P.O.:120; I.V.:440; IV Piggyback:150] Out: 1550 [Urine:1550] Intake/Output from this shift: Total I/O In: 620 [P.O.:480; I.V.:140] Out: 600 [Urine:600]  Physical Exam: Neck: no adenopathy, no carotid bruit, no JVD and supple, symmetrical, trachea midline Lungs: Bilateral rhonchi with occasional rales noted Heart: regular rate and rhythm, S1, S2 normal and Soft systolic murmur and S3 gallop noted Abdomen: soft, non-tender; bowel sounds normal; no masses,  no organomegaly Extremities: No clubbing cyanosis 1+ edema  Lab Results:  Recent Labs  08/14/12 0527 08/15/12 0351  WBC 3.9* 4.7  HGB 9.2* 9.2*  PLT 204 226    Recent Labs  08/14/12 0527 08/15/12 0351  NA 134* 136  K 3.5 4.0  CL 99 103  CO2 26 26  GLUCOSE 110* 164*  BUN 14 18  CREATININE 0.82 0.71   No results found for this basename: TROPONINI, CK, MB,  in the last 72 hours Hepatic Function Panel  Recent Labs  08/15/12 0351  PROT 5.9*  ALBUMIN 2.3*  AST 58*  ALT 85*  ALKPHOS 145*  BILITOT 0.4   No results found for this basename: CHOL,  in the last 72 hours No results found for this basename: PROTIME,  in the last 72 hours  Imaging: Imaging results have been reviewed and Dg Chest Port 1 View  08/15/2012   *RADIOLOGY REPORT*  Clinical Data: Acute respiratory failure with hypoxia.   Metastatic pancreatic carcinoma.  PORTABLE CHEST - 1 VIEW  Comparison: 08/14/2012  Findings: Bilateral pulmonary air space disease is again seen with predominant involvement right upper lobe.  This is unchanged.  No evidence of pneumothorax or pleural effusion.  Right-sided power port remains in appropriate position.  IMPRESSION: No significant change in bilateral airspace disease with right upper lobe predominance.   Original Report Authenticated By: Myles Rosenthal, M.D.    Cardiac Studies:  Assessment/Plan:  Bilateral pneumonia probably related to chemotherapy/questionable PNA Mild volume overload Pancytopenia status post chemotherapy Metastatic CA of pancreas Coronary artery disease history of anterolateral wall myocardial infarction in the past status post PCI 100% occluded LAD Multivessel coronary artery disease Hypertension Hypercholesteremia Anemia of chronic disease History of tobacco abuse Positive family history of coronary artery disease Plan DC ramipril for now And Aldactone as per orders Check labs in a.m. Steroids wean as per CCM     LOS: 5 days    Randall [name] N 08/16/2012, 5:51 PM

## 2012-08-16 NOTE — Progress Notes (Signed)
IP PROGRESS NOTE  Subjective:   He reports feeling better this morning. He developed increased respiratory distress over the weekend with progressive lung infiltrates. He is now being treated with steroids in addition to antibiotics. He was able to and related in the [facility] yesterday. No significant abdominal pain.  Objective: Vital signs in last 24 hours: Blood pressure 108/57, pulse 60, temperature 97.5 F (36.4 C), temperature source Oral, resp. rate 18, height 5\' 8"  (1.727 m), weight 159 lb (72.122 kg), SpO2 91.00%.  Intake/Output from previous day: 06/15 0701 - 06/16 0700 In: 710 [P.O.:120; I.V.:440; IV Piggyback:150] Out: 1550 [Urine:1550]  Physical Exam:  HEENT: No thrush Lungs: Inspiratory rhonchi at the posterior chest bilaterally Cardiac: Regular rate and rhythm Abdomen: No mass, no hepatomegaly Extremities: No leg edema   Portacath/PICC-without erythema  Lab Results:  Recent Labs  08/14/12 0527 08/15/12 0351  WBC 3.9* 4.7  HGB 9.2* 9.2*  HCT 27.2* 27.3*  PLT 204 226    BMET  Recent Labs  08/14/12 0527 08/15/12 0351  NA 134* 136  K 3.5 4.0  CL 99 103  CO2 26 26  GLUCOSE 110* 164*  BUN 14 18  CREATININE 0.82 0.71  CALCIUM 8.4 9.0    Studies/Results: Dg Chest Port 1 View  08/15/2012   *RADIOLOGY REPORT*  Clinical Data: Acute respiratory failure with hypoxia.  Metastatic pancreatic carcinoma.  PORTABLE CHEST - 1 VIEW  Comparison: 08/14/2012  Findings: Bilateral pulmonary air space disease is again seen with predominant involvement right upper lobe.  This is unchanged.  No evidence of pneumothorax or pleural effusion.  Right-sided power port remains in appropriate position.  IMPRESSION: No significant change in bilateral airspace disease with right upper lobe predominance.   Original Report Authenticated By: Myles Rosenthal, M.D.    Medications: I have reviewed the patient's current medications.  Assessment/Plan: 1.Metastatic pancreas cancer-the clinical  presentation, elevated CA 19-9, and abdomen CT are consistent with a diagnosis of metastatic pancreas cancer . 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  -initiation of gemcitabine/Abraxane on 07/21/2012, last treated on 08/04/2012  2. Pain secondary to #1- relieved with hydrocodone, improved  3. Acute myocardial infarction 06/16/2012-status post PTCA/drug-eluting stent , maintained on Brilinta  4. Status post Port-A-Cath placement 07/16/2012  5. Admission with a high fever and chills on 08/11/2012, a CT of the chest revealed multilobar infiltrates-cultures remain negative to date, he has developed bilateral airspace disease-? Infectious versus pneumonitis. ? Contribution from CHF 6. Pancytopenia secondary to chemotherapy, status post a red cell transfusion 08/13/2012 and 08/14/2012  He developed increased respiratory distress/hypoxia over the weekend and is now maintained on steroids and oxygen support. The clinical presentation is consistent with gemcitabine-induced pneumonitis, though this is an uncommon complication of this agent. There are reports of improvement with steroids.  Recommendations:  1. Continue broad-spectrum antibiotics and followup cultures 2. continue steroids 3. management of volume status per pulmonary medicine 4. plans for further chemotherapy are currently on hold       LOS: 5 days   Randall [name]  08/16/2012, 12:04 PM

## 2012-08-17 ENCOUNTER — Inpatient Hospital Stay (HOSPITAL_COMMUNITY): Payer: Medicare Other

## 2012-08-17 LAB — CBC
MCHC: 33.1 g/dL (ref 30.0–36.0)
Platelets: 327 10*3/uL (ref 150–400)
RDW: 16.7 % — ABNORMAL HIGH (ref 11.5–15.5)
WBC: 10 10*3/uL (ref 4.0–10.5)

## 2012-08-17 LAB — CULTURE, BLOOD (ROUTINE X 2): Culture: NO GROWTH

## 2012-08-17 LAB — BASIC METABOLIC PANEL
BUN: 33 mg/dL — ABNORMAL HIGH (ref 6–23)
Chloride: 101 mEq/L (ref 96–112)
GFR calc Af Amer: >90 mL/min (ref >90)
GFR calc non Af Amer: >90 mL/min (ref >90)
Potassium: 4.6 mEq/L (ref 3.5–5.1)

## 2012-08-17 LAB — PRO B NATRIURETIC PEPTIDE: Pro B Natriuretic peptide (BNP): 4896 pg/mL — ABNORMAL HIGH (ref 0–125)

## 2012-08-17 MED ORDER — PREDNISONE 10 MG PO TABS: 10.0000 mg | ORAL_TABLET | Freq: Every day | ORAL | Status: DC

## 2012-08-17 MED ORDER — PREDNISONE 5 MG PO TABS: 5.0000 mg | ORAL_TABLET | Freq: Every day | ORAL | Status: DC

## 2012-08-17 MED ORDER — METHYLPREDNISOLONE SODIUM SUCC 125 MG IJ SOLR
80.0000 mg | Freq: Two times a day (BID) | INTRAMUSCULAR | Status: AC
  Administered 2012-08-17: 80 mg via INTRAVENOUS
  Filled 2012-08-17: qty 1.28

## 2012-08-17 MED ORDER — FUROSEMIDE 20 MG PO TABS
20.0000 mg | ORAL_TABLET | Freq: Two times a day (BID) | ORAL | Status: DC
  Administered 2012-08-17 – 2012-08-18 (×3): 20 mg via ORAL
  Filled 2012-08-17 (×5): qty 1

## 2012-08-17 MED ORDER — PREDNISONE 20 MG PO TABS
40.0000 mg | ORAL_TABLET | Freq: Every day | ORAL | Status: DC
  Filled 2012-08-17: qty 2

## 2012-08-17 MED ORDER — PREDNISONE 20 MG PO TABS: 20.0000 mg | ORAL_TABLET | Freq: Every day | ORAL | Status: DC

## 2012-08-17 MED ORDER — PREDNISONE 50 MG PO TABS
60.0000 mg | ORAL_TABLET | Freq: Every day | ORAL | Status: AC
  Administered 2012-08-18: 60 mg via ORAL
  Filled 2012-08-17: qty 1

## 2012-08-17 NOTE — Progress Notes (Signed)
Pt profile: 37 M with pancreatic cancer recently treated with Gemzar/Abraxane and h/o CAD admitted 6/11 to Cards service with dyspnea, fever, cough. Has progressive diffuse infiltrative pattern on CXR. PCCM asked to see pt 6/13 with worsening hypoxic resp failure   Subj: Improved dyspnea @ rest. No new complaints. Denies pain. Walked three laps in hallway  Obj: Filed Vitals:   08/17/12 0618  BP: 143/62  Pulse: 62  Temp: 97.8 F (36.6 C)  Resp: 16    Gen: NAD, sitting in chair HEENT: WNL, PERRLA Neck: supple, No JVD noted Chest: Clear bilaterally, no wheezes Cardiac: RRR s M ZOX:WRUE, NT, NABS Ext: Lower ext edema bilaterally, warm, pulses palp  BMET    Component Value Date/Time   NA 136 08/17/2012 0500   NA 134* 08/04/2012 0955   K 4.6 08/17/2012 0500   K 4.5 08/04/2012 0955   CL 101 08/17/2012 0500   CL 103 08/04/2012 0955   CO2 26 08/17/2012 0500   CO2 25 08/04/2012 0955   GLUCOSE 135* 08/17/2012 0500   GLUCOSE 137* 08/04/2012 0955   BUN 33* 08/17/2012 0500   BUN 16.7 08/04/2012 0955   CREATININE 0.72 08/17/2012 0500   CREATININE 0.8 08/04/2012 0955   CALCIUM 9.1 08/17/2012 0500   CALCIUM 8.8 08/04/2012 0955   GFRNONAA >90 08/17/2012 0500   GFRAA >90 08/17/2012 0500    CBC    Component Value Date/Time   WBC 10.0 08/17/2012 0500   WBC 2.8* 08/04/2012 0955   RBC 3.36* 08/17/2012 0500   RBC 3.37* 08/04/2012 0955   HGB 9.2* 08/17/2012 0500   HGB 9.8* 08/04/2012 0955   HCT 27.8* 08/17/2012 0500   HCT 28.4* 08/04/2012 0955   PLT 327 08/17/2012 0500   PLT 134* 08/04/2012 0955   MCV 82.7 08/17/2012 0500   MCV 84.2 08/04/2012 0955   MCH 27.4 08/17/2012 0500   MCH 29.1 08/04/2012 0955   MCHC 33.1 08/17/2012 0500   MCHC 34.6 08/04/2012 0955   RDW 16.7* 08/17/2012 0500   RDW 14.0 08/04/2012 0955   LYMPHSABS 0.7 08/13/2012 0355   LYMPHSABS 0.3* 08/04/2012 0955   MONOABS 0.5 08/13/2012 0355   MONOABS 0.4 08/04/2012 0955   EOSABS 0.1 08/13/2012 0355   EOSABS 0.1 08/04/2012 0955   BASOSABS 0.0 08/13/2012 0355   BASOSABS 0.0 08/04/2012 0955    CXR:  2 Vies - 6/17 - Bilateral upper lobe infiltrates showing improvement    IMPRESSION: Acute hypoxic resp failure - infectious PNA vs pneumonitis (chemo induced) vs edema - Favor pneumonitis and edema. Pro BNP remained elevated at 2332, but now overall is euvolemic for LOS.  PCT negative, ESR 34.  PLAN/RECS:  1) Will begin slow wean of steroids tomorrow - taper pred to off over 7-10 days 2) Cont levofloxacin - total course of seven days (4 of 7) 3) Cont oseltamivir - will need 5 day course (4 of 5) 4) Follow up pulm appointment scheduled for two weeks, July 1st @ 1:30 5) PCCM will sign off. Please call if we can be of further assistance    Billy Fischer, MD ; [facility] [phone].  After 5:30 PM or weekends, call [phone]

## 2012-08-17 NOTE — Progress Notes (Signed)
IP PROGRESS NOTE  Subjective:   He reports feeling better. He would like to go home.  Objective: Vital signs in last 24 hours: Blood pressure 114/59, pulse 64, temperature 97.8 F (36.6 C), temperature source Oral, resp. rate 19, height 5\' 8"  (1.727 m), weight 159 lb (72.122 kg), SpO2 91.00%.  Intake/Output from previous day: 06/16 0701 - 06/17 0700 In: 927.7 [P.O.:480; I.V.:447.7] Out: 2275 [Urine:2275]  Physical Exam:  HEENT: No thrush Lungs: Inspiratory rhonchi at the lower posterior chest bilaterally Cardiac: Regular rate and rhythm Abdomen: No mass, no hepatomegaly, nontender Extremities: No leg edema   Portacath/PICC-without erythema  Lab Results:  Recent Labs  08/15/12 0351 08/17/12 0500  WBC 4.7 10.0  HGB 9.2* 9.2*  HCT 27.3* 27.8*  PLT 226 327    BMET  Recent Labs  08/15/12 0351 08/17/12 0500  NA 136 136  K 4.0 4.6  CL 103 101  CO2 26 26  GLUCOSE 164* 135*  BUN 18 33*  CREATININE 0.71 0.72  CALCIUM 9.0 9.1    Studies/Results: Dg Chest 2 View  08/17/2012   *RADIOLOGY REPORT*  Clinical Data: Shortness of breath  CHEST - 2 VIEW  Comparison: 08/15/2012  Findings: Cardiac shadow is stable.  Patchy infiltrative changes are again noted in both lungs predominately within the upper lobes. There is been slight improvement in the right upper lobe when compared with prior exam.  A right-sided chest port is again seen and stable.  No new focal abnormality is noted.  IMPRESSION: Bilateral upper lobe infiltrates with slight improvement on the right.  No new focal abnormality is noted.   Original Report Authenticated By: Alcide Clever, M.D.    Medications: I have reviewed the patient's current medications.  Assessment/Plan: 1.Metastatic pancreas cancer-the clinical presentation, elevated CA 19-9, and abdomen CT are consistent with a diagnosis of metastatic pancreas cancer . An ultrasound-guided biopsy of a liver lesion on 07/16/2012 confirmed metastatic  adenocarcinoma consistent with metastatic pancreas cancer.  -initiation of gemcitabine/Abraxane on 07/21/2012, last treated on 08/04/2012  2. Pain secondary to #1- relieved with hydrocodone, improved  3. Acute myocardial infarction 06/16/2012-status post PTCA/drug-eluting stent , maintained on Brilinta  4. Status post Port-A-Cath placement 07/16/2012  5. Admission with a high fever and chills on 08/11/2012, a CT of the chest revealed multilobar infiltrates-cultures remain negative to date, he has developed bilateral airspace disease-? Infectious versus pneumonitis. ? Contribution from CHF. Clinical improvement over the past 2 days. 6. Pancytopenia secondary to chemotherapy, status post a red cell transfusion 08/13/2012 and 08/14/2012  The hypoxia has improved. He most likely had pneumonitis related to gemcitabine.  Recommendations:  1. steroid taper as recommended by pulmonary medicine 2. complete course of Levaquin and Tamiflu 3. followup at the cancer Center as scheduled-we will consider resuming chemotherapy with a different regimen when the pneumonitis has completely resolved.        LOS: 6 days   Jakiah Bienaime, Jillyn Hidden  08/17/2012, 5:41 PM

## 2012-08-17 NOTE — Progress Notes (Signed)
Subjective:  Patient denies any chest pain states breathing is improved since yesterday Objective:  Vital Signs in the last 24 hours: Temp:  [97.3 F (36.3 C)-97.9 F (36.6 C)] 97.8 F (36.6 C) (06/17 0618) Pulse Rate:  [62-82] 62 (06/17 0618) Resp:  [11-20] 16 (06/17 0618) BP: (119-143)/(58-65) 143/62 mmHg (06/17 0618) SpO2:  [92 %-95 %] 93 % (06/17 0618)  Intake/Output from previous day: 06/16 0701 - 06/17 0700 In: 927.7 [P.O.:480; I.V.:447.7] Out: 2275 [Urine:2275] Intake/Output from this shift: Total I/O In: -  Out: 500 [Urine:500]  Physical Exam: Neck: no adenopathy, no carotid bruit, no JVD and supple, symmetrical, trachea midline Lungs: Decreased breath sound at bases with bilateral rhonchi and rales air entry improved  Heart: regular rate and rhythm, S1, S2 normal and Soft systolic murmur and S3 gallop noted Abdomen: soft, non-tender; bowel sounds normal; no masses,  no organomegaly Extremities: extremities normal, atraumatic, no cyanosis or edema  Lab Results:  Recent Labs  08/15/12 0351 08/17/12 0500  WBC 4.7 10.0  HGB 9.2* 9.2*  PLT 226 327    Recent Labs  08/15/12 0351 08/17/12 0500  NA 136 136  K 4.0 4.6  CL 103 101  CO2 26 26  GLUCOSE 164* 135*  BUN 18 33*  CREATININE 0.71 0.72   No results found for this basename: TROPONINI, CK, MB,  in the last 72 hours Hepatic Function Panel  Recent Labs  08/15/12 0351  PROT 5.9*  ALBUMIN 2.3*  AST 58*  ALT 85*  ALKPHOS 145*  BILITOT 0.4   No results found for this basename: CHOL,  in the last 72 hours No results found for this basename: PROTIME,  in the last 72 hours  Imaging: Imaging results have been reviewed and Dg Chest 2 View  08/17/2012   *RADIOLOGY REPORT*  Clinical Data: Shortness of breath  CHEST - 2 VIEW  Comparison: 08/15/2012  Findings: Cardiac shadow is stable.  Patchy infiltrative changes are again noted in both lungs predominately within the upper lobes. There is been slight  improvement in the right upper lobe when compared with prior exam.  A right-sided chest port is again seen and stable.  No new focal abnormality is noted.  IMPRESSION: Bilateral upper lobe infiltrates with slight improvement on the right.  No new focal abnormality is noted.   Original Report Authenticated By: Alcide Clever, M.D.    Cardiac Studies:  Assessment/Plan:  Bilateral pneumonia probably related to chemotherapy/questionable PNA  Mild volume overload  Pancytopenia status post chemotherapy  Metastatic CA of pancreas  Coronary artery disease history of anterolateral wall myocardial infarction in the past status post PCI 100% occluded LAD  Multivessel coronary artery disease  Hypertension  Hypercholesteremia  Anemia of chronic disease  History of tobacco abuse  Positive family history of coronary artery disease Plan Continue present management Check labs in a.m.  LOS: 6 days    Lisel Siegrist N 08/17/2012, 12:52 PM

## 2012-08-18 ENCOUNTER — Encounter: Payer: Self-pay | Admitting: Nutrition

## 2012-08-18 ENCOUNTER — Other Ambulatory Visit: Payer: Self-pay | Admitting: Lab

## 2012-08-18 ENCOUNTER — Other Ambulatory Visit: Payer: Self-pay | Admitting: *Deleted

## 2012-08-18 ENCOUNTER — Telehealth: Payer: Self-pay | Admitting: *Deleted

## 2012-08-18 ENCOUNTER — Ambulatory Visit: Payer: Self-pay

## 2012-08-18 LAB — BASIC METABOLIC PANEL
BUN: 30 mg/dL — ABNORMAL HIGH (ref 6–23)
Calcium: 8.9 mg/dL (ref 8.4–10.5)
GFR calc Af Amer: >90 mL/min (ref >90)
GFR calc non Af Amer: >90 mL/min (ref >90)
Glucose, Bld: 145 mg/dL — ABNORMAL HIGH (ref 70–99)
Potassium: 4.5 mEq/L (ref 3.5–5.1)
Sodium: 136 mEq/L (ref 135–145)

## 2012-08-18 LAB — CBC
Hemoglobin: 9.5 g/dL — ABNORMAL LOW (ref 13.0–17.0)
MCH: 26.9 pg (ref 26.0–34.0)
MCHC: 32.4 g/dL (ref 30.0–36.0)
Platelets: 367 10*3/uL (ref 150–400)
RBC: 3.53 MIL/uL — ABNORMAL LOW (ref 4.22–5.81)

## 2012-08-18 LAB — MAGNESIUM: Magnesium: 2.4 mg/dL (ref 1.5–2.5)

## 2012-08-18 LAB — PRO B NATRIURETIC PEPTIDE: Pro B Natriuretic peptide (BNP): 4718 pg/mL — ABNORMAL HIGH (ref 0–125)

## 2012-08-18 MED ORDER — FERROUS SULFATE 325 (65 FE) MG PO TABS: 325.0000 mg | ORAL_TABLET | Freq: Three times a day (TID) | ORAL | Status: DC

## 2012-08-18 MED ORDER — PREDNISONE 20 MG PO TABS: 40.0000 mg | ORAL_TABLET | Freq: Every day | ORAL | Status: DC

## 2012-08-18 MED ORDER — LEVOFLOXACIN 500 MG PO TABS: 500.0000 mg | ORAL_TABLET | Freq: Every day | ORAL | Status: DC

## 2012-08-18 MED ORDER — HEPARIN SOD (PORK) LOCK FLUSH 100 UNIT/ML IV SOLN
500.0000 [IU] | INTRAVENOUS | Status: AC | PRN
  Administered 2012-08-18: 500 [IU]

## 2012-08-18 MED ORDER — SPIRONOLACTONE 25 MG PO TABS: 25.0000 mg | ORAL_TABLET | Freq: Every day | ORAL | Status: DC

## 2012-08-18 NOTE — Discharge Summary (Signed)
Randall [name], Randall [name] NO.:  [id number]  MEDICAL RECORD NO.:  [id number]  LOCATION:  1426                         FACILITY:  [facility]  PHYSICIAN:  Yanelie Abraha N. Sharyn Lull, M.D. DATE OF BIRTH:  1941-02-08  DATE OF ADMISSION:  08/11/2012 DATE OF DISCHARGE:  08/18/2012                              DISCHARGE SUMMARY   ADMITTING DIAGNOSES: 1. Probable bilateral pneumonia. 2. Pancytopenia status post chemotherapy. 3. Metastatic adenocarcinoma of the pancreas. 4. Coronary artery disease status post anterolateral wall myocardial     infarction status post percutaneous coronary intervention to left     anterior descending coronary artery in the past. 5. Multivessel coronary artery disease. 6. Hypertension. 7. Hypercholesteremia. 8. Acute on chronic anemia, rule out gastrointestinal loss. 9. History of tobacco abuse. 10.Positive family history of coronary artery disease.  DISCHARGE DIAGNOSES: 1. Status post probable bilateral pneumonia/pneumonitis secondary to     chemotherapy. 2. Resolving congestive heart failure secondary to volume overload. 3. Resolving pancytopenia status post chemo. 4. Metastatic adenocarcinoma of the pancreas. 5. Coronary artery disease status post anterolateral wall myocardial     infarction in the past status post percutaneous coronary     intervention to left anterior descending coronary artery in the     past. 6. Multivessel coronary artery disease. 7. Hypertension. 8. Hypercholesteremia. 9. Acute on chronic anemia, stable. 10.History of tobacco abuse. 11.Positive family history of coronary artery disease.  MEDICATIONS:  Discharge home medications are" 1. Citalopram 20 mg daily. 2. Ferrous sulfate 325 mg 3 times daily. 3. Levofloxacin 500 mg daily for 3 days. 4. Prednisone taper as per orders. 5. Aldactone 25 mg 1 tablet daily. 6. Tylenol 500 mg as needed for pain. 7. Xanax 0.5 mg 3 times daily. 8. Aspirin 81 mg 1 tablet daily. 9.  Atorvastatin 80 mg 1 tablet daily. 10.Carvedilol 6.25 mg twice daily. 11.Colace 100 mg 1 tablet daily for constipation. 12.Norco 1 tablet every 6 hours for pain as needed. 13.EMLA cream, apply topically as needed. 14.Nitrostat 0.4 mg sublingual, use as directed. 15.Compazine 10 mg 1 tablet every 6 hours as needed. 16.Ramipril 1.25 mg 1 capsule daily.  17.  Brilinta 90 mg twice daily.  DIET:  Low salt, low cholesterol.  ACTIVITY:  Increase activity slowly as tolerated.  FOLLOWUP:  Follow up with me in 1 week.  Follow up with critical care medicine in 2 weeks as scheduled.  Follow up with Oncology as scheduled.  CONDITION AT DISCHARGE:  Stable.  BRIEF HISTORY OF PRESENT ILLNESS:  [name] is a 72 year old male with past medical history significant for coronary artery disease status post anterolateral wall myocardial infarction in April 2014, status post PTCA stenting to 100% occluded proximal LAD, noted to have multivessel coronary artery disease, recently diagnosed to have metastatic adenocarcinoma of the pancreas, hypertension, hypercholesteremia, history of tobacco abuse, positive family history of coronary artery disease, anemia of chronic disease.  He came to the ER, complaining of cough with whitish phlegm associated with dry throat, chills, cold for the last few days.  Today, early this morning, noted by daughter to have temp of 102.9.  He came to the ER.  The patient denies any chest pain, complains  of mild shortness of breath.  Denies any hemoptysis.  Denies nausea, vomiting, diarrhea.  Denies any black tarry stools.  Denies PND, orthopnea, or leg swelling.  Denies palpitation, lightheadedness, or syncope.  The patient states he had chemotherapy on last Wednesday 1 week ago.  The patient had CT of the chest in the ED which showed bilateral multilevel infiltrate and also questionable mets.  PAST MEDICAL HISTORY:  As above.  PAST SURGICAL HISTORY:  He had cath and PTCA  stenting in April 2014.  PHYSICAL EXAMINATION:  GENERAL:  He was alert, awake, and oriented x3. VITAL SIGNS:  Blood pressure was 136/76.  Repeat temperature in the ER was 99.8. HEENT:  Conjunctivae pink. NECK:  Supple.  No JVD.  No bruit. LUNGS:  He has decreased breath sounds at bases with bilateral rhonchi. CARDIOVASCULAR:  S1, S2 was normal.  There was soft systolic murmur.  No S3 gallop. ABDOMEN:  Soft.  Bowel sounds were present.  Nontender. EXTREMITIES:  There is no clubbing, cyanosis, or edema.  LABORATORY DATA:  Sodium was 130, potassium 3.8, BUN 16, creatinine 0.83.  Hemoglobin was 7.4, hematocrit 21.6, white count of 2.5, platelet count 163,000.  Post transfusion, hemoglobin was 9.2, hematocrit 27.2, white count of 3.9 which has been stable.  Today, hemoglobin is 9.5, hematocrit 29.3, white count of 11.  Sodium is 136, potassium 4.5, BUN 30, creatinine 0.73.  IMAGING STUDIES:  Chest x-ray done yesterday showed bilateral upper lobe infiltrate with slight improvement on the right.  No new focal abnormalities noted.  Blood cultures were negative.  Strep pneumonia urinary antigen was negative as was urinary Legionella antigen was negative.  HIV was nonreactive.  BRIEF HOSPITAL COURSE:  The patient was admitted to telemetry unit, was started on broad-spectrum antibiotics.  Pan cultures were obtained.  The patient subsequently became hypoxic and went into acute respiratory failure and requiring transfer to ICU.  Critical Care Medicine consult and Oncology consultation were obtained.  The patient was felt to have chemotherapy-induced pneumonitis.  The patient was started on prednisone.  The patient also received 2 units of packed RBCs during the hospital stay.  Post transfusion, the patient went into mild volume overload, requiring p.o. Lasix which was switched to Aldactone with good diuresis. His prednisone is being weaned off slowly.  IV antibiotics were switched to Levaquin  which he is tolerating it well.  The patient has been ambulating in the hallway without any problems.  The patient did not have any further episodes of fever or chills during the hospital stay. Discussed with the patient also regarding RCA lesion and regarding PCI to RCA.  The patient, at this point, wanted to get stronger and wanted to know about his cancer status before considering PCI and would like to be treated medically at this point.  The patient did not have any episodes of chest pain during the hospital stay, had frequent trigeminy, occasional bigeminy, and few beats of nonsustained SVT which were all asymptomatic.  The patient is eager to go home and will be discharged home and will be followed up in my office in 1 week and all the consultants as scheduled.     Eduardo Osier. Sharyn Lull, M.D.     MNH/MEDQ  D:  08/18/2012  T:  08/18/2012  Job:  [id number]

## 2012-08-18 NOTE — Progress Notes (Signed)
Provided support with pt and daughters.   Pt hopeful to discharge soon. Spoke with chaplain and family about losses and grief related to illness.  Pt generally active, energetic, in control. Coping with large changes in life since April.    Chaplain provided empathic presence, emotional and spiritual support, grief and coping work.    Belva Crome MDiv

## 2012-08-18 NOTE — Discharge Summary (Signed)
  Discharge summary dictated on 08/18/2012 dictation [phone]

## 2012-08-18 NOTE — Progress Notes (Signed)
Pt started having Bigeminy, trigeminy and quadgeminy.  Pt would switch between these and SR with PVC's throughout the night.  Notified Dr. Sharyn Lull and received new orders for BMP and Mag level in the a.m.  Will continue to monitor pt through the night.

## 2012-08-18 NOTE — Progress Notes (Signed)
NUTRITION FOLLOW UP  Intervention:   Ensure Complete BID Encourage continuation of good PO  Nutrition Dx:   Increased nutrient needs related to metastatic pancreatic CA with recent chemotherapy as evidenced by MD notes; ongoing  Goal:   Pt to consume >90% of meals/supplements; being met  Monitor:   Weight; 3 lb wt loss from 6/11 to 6/18 Labs; high BUN, low hemoglobin Intake; 100% x 3 meals and one Ensure daily   Assessment:   Pt reports that his appetite is much improved and he is eating 100% of 3 meals daily. Pt states that he has support at home and has people ready to make food and feed him. Encouraged pt to monitor weight and continue use of Ensure supplements as needed. Pt has no further questions or concerns; pt hope to be discharged soon.   Height: Ht Readings from Last 1 Encounters:  08/13/12 5\' 8"  (1.727 m)    Weight Status:   Wt Readings from Last 1 Encounters:  08/18/12 154 lb 1.6 oz (69.9 kg)    Re-estimated needs:  Kcal: 1800-2200  Protein: 85-105g  Fluid: 1.8-2.2L/day  Skin: intact; +1 RLE and LLE edema  Diet Order: General   Intake/Output Summary (Last 24 hours) at 08/18/12 1625 Last data filed at 08/18/12 1500  Gross per 24 hour  Intake 1132.33 ml  Output   2410 ml  Net -1277.67 ml    Last BM: 6/16   Labs:   Recent Labs Lab 08/15/12 0351 08/17/12 0500 08/18/12 0457  NA 136 136 136  K 4.0 4.6 4.5  CL 103 101 102  CO2 26 26 27   BUN 18 33* 30*  CREATININE 0.71 0.72 0.73  CALCIUM 9.0 9.1 8.9  MG 2.4  --  2.4  GLUCOSE 164* 135* 145*    CBG (last 3)  No results found for this basename: GLUCAP,  in the last 72 hours  Scheduled Meds: . aspirin EC  81 mg Oral Daily  . atorvastatin  40 mg Oral q1800  . carvedilol  6.25 mg Oral Q12H  . citalopram  20 mg Oral Daily  . enoxaparin (LOVENOX) injection  40 mg Subcutaneous Q24H  . feeding supplement  237 mL Oral BID BM  . ferrous sulfate  325 mg Oral TID WC  . furosemide  20 mg Oral BID   . levofloxacin  500 mg Oral Daily  . [START ON 08/19/2012] predniSONE  40 mg Oral Q breakfast   Followed by  . [START ON 08/20/2012] predniSONE  20 mg Oral Q breakfast   Followed by  . [START ON 08/21/2012] predniSONE  10 mg Oral Q breakfast   Followed by  . [START ON 08/22/2012] predniSONE  5 mg Oral Q breakfast  . spironolactone  25 mg Oral Daily  . Ticagrelor  90 mg Oral BID    Continuous Infusions: . sodium chloride 20 mL/hr at 08/17/12 2239    Ian Malkin RD, LDN Inpatient Clinical Dietitian Pager: [phone] After Hours Pager: [phone]

## 2012-08-18 NOTE — Telephone Encounter (Signed)
Per Dr. Truett Perna : Cancel 6/25 lab/chemo. Keep follow up on 7/2. Called patient and made him aware. He was still in hospital room awaiting Dr. Sharyn Lull to discharge him.

## 2012-08-19 NOTE — Progress Notes (Signed)
Discharge summary sent to payer through [employer]  

## 2012-08-20 ENCOUNTER — Encounter (HOSPITAL_COMMUNITY): Payer: Medicare Other

## 2012-08-20 ENCOUNTER — Telehealth: Payer: Self-pay | Admitting: *Deleted

## 2012-08-20 NOTE — Telephone Encounter (Signed)
Received message from pt re: appt date/time.  Spoke with pt informed him next lab/md/chemo is 09/01/12 starting at 8:30am.  Pt verbalized understanding and expressed appreciation for call back.

## 2012-08-23 ENCOUNTER — Encounter (HOSPITAL_COMMUNITY)
Admission: RE | Admit: 2012-08-23 | Discharge: 2012-08-23 | Disposition: A | Discharge status: Home / Self Care | Payer: Medicare Other | Source: Ambulatory Visit | Attending: Cardiology | Admitting: Cardiology

## 2012-08-23 NOTE — Progress Notes (Signed)
Randall [name] returned to exercise today per Dr Annitta Jersey weight is down 4 kg from his last exercise session on 08/09/2012. [name] has a follow up appointment with Dr Sharyn Lull tomorrow. Will fax exercise flow sheets to Dr. Annitta Jersey office for review. [name]'s vital signs were stable today. Will continue to monitor the patient throughout  the program.

## 2012-08-25 ENCOUNTER — Ambulatory Visit: Payer: Self-pay

## 2012-08-25 ENCOUNTER — Other Ambulatory Visit: Payer: Self-pay | Admitting: Lab

## 2012-08-26 ENCOUNTER — Ambulatory Visit: Payer: Medicare Other | Admitting: Nutrition

## 2012-08-26 NOTE — Progress Notes (Signed)
Patient is a 72 year old male diagnosed with pancreas cancer in May of 2014.  He is a patient of Dr. Truett Perna.  Past medical history includes anxiety, MI, hypertension and diverticulosis.  Medications include Xanax, Lipitor, and Colace.  Labs include glucose of 145 and BUN of 30 on June 18.  Height: 68 inches. Weight: 154.1 pounds June 18. Usual body weight: 176 pounds April 2014. BMI: 23.44.  Patient reports that his appetite has improved as well as his oral intake since chemotherapy has been on hold.  He is eating 3 meals daily along with snacks in between.  He drinks one to 2 ensure complete daily.  He also consumes protein bars.  Overall calorie intake appears to be approximately 1800-2000 calories on a daily basis.  Patient denies nutrition side effects at this time.  Nutrition diagnosis: Unintended weight loss related to diagnosis of pancreas cancer as evidenced by 12% weight loss in the past 2 months.  Patient meets criteria for severe malnutrition in the context of acute illness secondary to 12% weight loss and severe depletion of muscle and fat stores on physical exam.  Intervention: Patient was educated to increase ensure complete to twice a day to 3 times a day as well as continuing protein bars one to 2 daily.  I've educated him on healthy fats that he can add to his meals and snacks to provide additional calories.  I provided patient with education fact sheets, as well as samples, and coupons.  Teach back method used.  Monitoring, evaluation, goals: Patient will tolerate adequate calories and protein to minimize further weight loss.  Next visit: Patient will contact me if he develops questions or concerns or needs to resume treatment.

## 2012-08-27 ENCOUNTER — Encounter (HOSPITAL_COMMUNITY)
Admission: RE | Admit: 2012-08-27 | Discharge: 2012-08-27 | Disposition: A | Discharge status: Home / Self Care | Payer: Medicare Other | Source: Ambulatory Visit | Attending: Cardiology | Admitting: Cardiology

## 2012-08-30 ENCOUNTER — Encounter (HOSPITAL_COMMUNITY)
Admission: RE | Admit: 2012-08-30 | Discharge: 2012-08-30 | Disposition: A | Discharge status: Home / Self Care | Payer: Medicare Other | Source: Ambulatory Visit | Attending: Cardiology | Admitting: Cardiology

## 2012-08-31 ENCOUNTER — Encounter: Payer: Self-pay | Admitting: Adult Health

## 2012-08-31 ENCOUNTER — Other Ambulatory Visit: Payer: Self-pay | Admitting: *Deleted

## 2012-08-31 ENCOUNTER — Ambulatory Visit (INDEPENDENT_AMBULATORY_CARE_PROVIDER_SITE_OTHER): Payer: Medicare Other | Admitting: Adult Health

## 2012-08-31 ENCOUNTER — Ambulatory Visit (INDEPENDENT_AMBULATORY_CARE_PROVIDER_SITE_OTHER)
Admission: RE | Admit: 2012-08-31 | Discharge: 2012-08-31 | Disposition: A | Discharge status: Home / Self Care | Payer: Medicare Other | Source: Ambulatory Visit | Attending: Adult Health | Admitting: Adult Health

## 2012-08-31 VITALS — BP 130/62 | HR 62 | Temp 97.7°F | Ht 67.0 in | Wt 152.4 lb

## 2012-08-31 DIAGNOSIS — J189 Pneumonia, unspecified organism: Secondary | ICD-10-CM

## 2012-08-31 DIAGNOSIS — C259 Malignant neoplasm of pancreas, unspecified: Secondary | ICD-10-CM

## 2012-08-31 NOTE — Assessment & Plan Note (Addendum)
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 Craige Cotta   With follow up cxr .  Hold on additional steroids for now

## 2012-08-31 NOTE — Progress Notes (Signed)
  Subjective:    Patient ID: Randall [name], male    DOB: Feb 28, 1941, 72 y.o.   MRN: [id number]  HPI 72 yo male with known hx of metastatic pancreatic cancer recently dx 06/2012 admitted to hospital 08/11/12 for hypoxia .  Former smoker. PCCM consult felt to be chemo induced pneumonitis.    08/31/2012 Post Hospital follow up  Pt was admitted 6/11-6/18 for Probable bilateral pneumonia/pneumonitis secondary to chemo recently treated with Gemzar/Abraxane   complicated by CHF,  Pancytopenia And  Metastatic adenocarcinoma of the pancreas.  CT chest showed Multilobar patchy ground-glass and scattered consolidation,  A few scattered pulmonary nodules, predominating in the left lower lobe  Tx w/ steroid challenge for chemotherapy induced pneumonitis . He did have to be transfused 2 u PRBC which contributed to fluid overload requiring diuresis.  He was treated with empiric abx and discharged on levaquin and steroid taper.   Cx data was neg. He does have known CAD w/ recent stent in 06/2012 .    Since discharge he is feeling better. CXR today shows  Marked improvement in the bilateral pulmonary infiltrates.  Infiltrate does persist at the right base and left apex. Has finished ABX and steroids .   Has oncology follow up in am. Future treatment plan to be decided per oncology.   No cough, wheezing, edema, fever or hemotpysis.  Former smoker but no previous dx of COPD or asthma   Review of Systems Constitutional:   No  weight loss, night sweats,  Fevers, chills,  +fatigue, or  lassitude.  HEENT:   No headaches,  Difficulty swallowing,  Tooth/dental problems, or  Sore throat,                No sneezing, itching, ear ache, nasal congestion, post nasal drip,   CV:  No chest pain,  Orthopnea, PND, swelling in lower extremities, anasarca, dizziness, palpitations, syncope.   GI  No heartburn, indigestion, abdominal pain, nausea, vomiting, diarrhea, change in bowel habits, loss of appetite, bloody  stools.   Resp:    No chest wall deformity  Skin: no rash or lesions.  GU: no dysuria, change in color of urine, no urgency or frequency.  No flank pain, no hematuria   MS:  No joint pain or swelling.  No decreased range of motion.  No back pain.  Psych:  No change in mood or affect. No depression or anxiety.  No memory loss.         Objective:   Physical Exam GEN: A/Ox3; pleasant , NAD  HEENT:  [place]/AT,  EACs-clear, TMs-wnl, NOSE-clear, THROAT-clear, no lesions, no postnasal drip or exudate noted.   NECK:  Supple w/ fair ROM; no JVD; normal carotid impulses w/o bruits; no thyromegaly or nodules palpated; no lymphadenopathy.  RESP  Clear  P & A; w/o, wheezes/ rales/ or rhonchi.no accessory muscle use, no dullness to percussion  CARD:  RRR, no m/r/g  , no peripheral edema, pulses intact, no cyanosis or clubbing.  GI:   Soft & nt; nml bowel sounds; no organomegaly or masses detected.  Musco: Warm bil, no deformities or joint swelling noted.   Neuro: alert, no focal deficits noted.    Skin: Warm, no lesions or rashes    CXR  Marked improvement in the bilateral pulmonary infiltrates.  Infiltrate does persist at the right base and left apex.    Assessment & Plan:

## 2012-08-31 NOTE — Progress Notes (Signed)
Reviewed and agree with assessment/plan. 

## 2012-08-31 NOTE — Patient Instructions (Addendum)
Continue on current regimen.  Follow up with Dr. Craige Cotta  In 3-4 weeks with chest xray

## 2012-09-01 ENCOUNTER — Ambulatory Visit: Payer: Medicare Other

## 2012-09-01 ENCOUNTER — Other Ambulatory Visit (HOSPITAL_BASED_OUTPATIENT_CLINIC_OR_DEPARTMENT_OTHER): Payer: Medicare Other

## 2012-09-01 ENCOUNTER — Encounter: Payer: Self-pay | Admitting: *Deleted

## 2012-09-01 ENCOUNTER — Telehealth: Payer: Self-pay | Admitting: Oncology

## 2012-09-01 ENCOUNTER — Ambulatory Visit (HOSPITAL_BASED_OUTPATIENT_CLINIC_OR_DEPARTMENT_OTHER): Payer: Medicare Other | Admitting: Oncology

## 2012-09-01 VITALS — BP 92/59 | HR 75 | Temp 97.6°F | Resp 20 | Ht 67.0 in | Wt 150.4 lb

## 2012-09-01 DIAGNOSIS — C259 Malignant neoplasm of pancreas, unspecified: Secondary | ICD-10-CM

## 2012-09-01 LAB — COMPREHENSIVE METABOLIC PANEL (CC13)
ALT: 25 U/L (ref 0–55)
AST: 17 U/L (ref 5–34)
Albumin: 2.8 g/dL — ABNORMAL LOW (ref 3.5–5.0)
Alkaline Phosphatase: 124 U/L (ref 40–150)
Chloride: 104 mEq/L (ref 98–109)
Potassium: 4.4 mEq/L (ref 3.5–5.1)
Sodium: 137 mEq/L (ref 136–145)
Total Protein: 6.7 g/dL (ref 6.4–8.3)

## 2012-09-01 LAB — CBC WITH DIFFERENTIAL/PLATELET
BASO%: 0.8 % (ref 0.0–2.0)
EOS%: 5.5 % (ref 0.0–7.0)
MCH: 28.9 pg (ref 27.2–33.4)
MCV: 84.9 fL (ref 79.3–98.0)
MONO%: 8.8 % (ref 0.0–14.0)
RBC: 3.98 10*6/uL — ABNORMAL LOW (ref 4.20–5.82)
RDW: 20.5 % — ABNORMAL HIGH (ref 11.0–14.6)
lymph#: 1.1 10*3/uL (ref 0.9–3.3)

## 2012-09-01 NOTE — Telephone Encounter (Signed)
gv pt appt schedule for July and referral for cxr @ [facility] same day as fu/tx on 7/16. Pt will do cxr before appts here.

## 2012-09-01 NOTE — Progress Notes (Signed)
   Randall [name]    OFFICE PROGRESS NOTE   INTERVAL HISTORY:   He was admitted on 08/11/2012 with fever. He developed respiratory failure and bilateral lung infiltrates while in the hospital. No infection was identified. He was diagnosed with chemotherapy induced pneumonitis. His clinical status improved with steroids. He completed an outpatient steroid taper.  He has noted resolution of the abdominal pain. He denies shortness of breath. His appetite has improved.  Objective:  Vital signs in last 24 hours:  Blood pressure 92/59, pulse 75, temperature 97.6 F (36.4 C), temperature source Oral, resp. rate 20, height 5\' 7"  (1.702 m), weight 150 lb 6.4 oz (68.221 kg), SpO2 96.00%.    HEENT: No thrush Resp: Good air movement bilaterally, no significant rales Cardio: Regular rate and rhythm GI: No hepatomegaly, nontender, no mass Vascular: No leg edema   Portacath/PICC-without erythema  Lab Results:  Lab Results  Component Value Date   WBC 9.2 09/01/2012   HGB 11.5* 09/01/2012   HCT 33.8* 09/01/2012   MCV 84.9 09/01/2012   PLT 159 09/01/2012   ANC 6.7  X-ray: Chest x-ray 08/31/2012, compared to 08/17/2012-partial resolution of bilateral pulmonary infiltrates. Persistent infiltrate at the right lung base and left upper lobe. I reviewed the x-ray with Randall [name].    Medications: I have reviewed the patient's current medications.  Assessment/Plan: 1.Metastatic pancreas cancer-the clinical presentation, elevated CA 19-9, and abdomen CT are consistent with a diagnosis of metastatic pancreas cancer . 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  -initiation of gemcitabine/Abraxane on 07/21/2012, last treated on 08/04/2012  2. Pain secondary to #1- resolved 3. Acute myocardial infarction 06/16/2012-status post PTCA/drug-eluting stent , maintained on Brilinta  4. Status post  Port-A-Cath placement 07/16/2012  5. Admission with a high fever and chills on 08/11/2012, a CT of the chest revealed multilobar infiltrates , diagnosed with chemotherapy induced pneumonitis-improved with steroids   Disposition:  He is recovering from the admission with pneumonitis. He has completed a steroid taper and the chest x-ray is improved. I suspect the pneumonitis was related to gemcitabine. He will not receive further gemcitabine. I discussed treatment options with Randall [name] today. We will followup on the CA 19-9 from today. He will remain off of treatment until he returns for an office visit and repeat chest x-ray in 2 weeks.  We discussed FOLFIRINOX and FOLFOX. My initial impression is to recommend treatment with FOLFOX once he has completely recovered from the pneumonitis.   Thornton Papas, MD  09/01/2012  12:53 PM

## 2012-09-02 ENCOUNTER — Telehealth: Payer: Self-pay | Admitting: *Deleted

## 2012-09-02 NOTE — Telephone Encounter (Signed)
Message copied by Caleb Popp on Thu Sep 02, 2012  8:52 AM ------      Message from: Thornton Papas B      Created: Wed Sep 01, 2012  9:01 PM       Please call patient, ca19-9 is much better ------

## 2012-09-02 NOTE — Telephone Encounter (Signed)
Called pt with lab results. He voiced understanding and appreciation for call.

## 2012-09-03 ENCOUNTER — Encounter (HOSPITAL_COMMUNITY): Admission: RE | Admit: 2012-09-03 | Payer: Medicare Other | Source: Ambulatory Visit

## 2012-09-06 ENCOUNTER — Encounter (HOSPITAL_COMMUNITY)
Admission: RE | Admit: 2012-09-06 | Discharge: 2012-09-06 | Disposition: A | Discharge status: Home / Self Care | Payer: Medicare Other | Source: Ambulatory Visit | Attending: Cardiology | Admitting: Cardiology

## 2012-09-06 DIAGNOSIS — I252 Old myocardial infarction: Secondary | ICD-10-CM | POA: Insufficient documentation

## 2012-09-06 DIAGNOSIS — Z8249 Family history of ischemic heart disease and other diseases of the circulatory system: Secondary | ICD-10-CM | POA: Insufficient documentation

## 2012-09-06 DIAGNOSIS — Z5189 Encounter for other specified aftercare: Secondary | ICD-10-CM | POA: Insufficient documentation

## 2012-09-06 DIAGNOSIS — I2510 Atherosclerotic heart disease of native coronary artery without angina pectoris: Secondary | ICD-10-CM | POA: Insufficient documentation

## 2012-09-07 ENCOUNTER — Encounter: Payer: Self-pay | Admitting: Oncology

## 2012-09-09 ENCOUNTER — Other Ambulatory Visit: Payer: Self-pay | Admitting: *Deleted

## 2012-09-09 ENCOUNTER — Encounter: Payer: Self-pay | Admitting: Internal Medicine

## 2012-09-09 MED ORDER — ALPRAZOLAM 0.5 MG PO TABS: 0.5000 mg | ORAL_TABLET | Freq: Three times a day (TID) | ORAL | Status: DC | PRN

## 2012-09-10 ENCOUNTER — Encounter (HOSPITAL_COMMUNITY)
Admission: RE | Admit: 2012-09-10 | Discharge: 2012-09-10 | Disposition: A | Discharge status: Home / Self Care | Payer: Medicare Other | Source: Ambulatory Visit | Attending: Cardiology | Admitting: Cardiology

## 2012-09-12 ENCOUNTER — Encounter: Payer: Self-pay | Admitting: Internal Medicine

## 2012-09-13 ENCOUNTER — Encounter (HOSPITAL_COMMUNITY)
Admission: RE | Admit: 2012-09-13 | Discharge: 2012-09-13 | Disposition: A | Discharge status: Home / Self Care | Payer: Medicare Other | Source: Ambulatory Visit | Attending: Cardiology | Admitting: Cardiology

## 2012-09-13 ENCOUNTER — Other Ambulatory Visit: Payer: Medicare Other

## 2012-09-15 ENCOUNTER — Telehealth: Payer: Self-pay | Admitting: Oncology

## 2012-09-15 ENCOUNTER — Ambulatory Visit (HOSPITAL_BASED_OUTPATIENT_CLINIC_OR_DEPARTMENT_OTHER): Payer: Medicare Other | Admitting: Oncology

## 2012-09-15 ENCOUNTER — Ambulatory Visit: Payer: Self-pay

## 2012-09-15 ENCOUNTER — Encounter: Payer: Self-pay | Admitting: Oncology

## 2012-09-15 ENCOUNTER — Ambulatory Visit (HOSPITAL_COMMUNITY)
Admission: RE | Admit: 2012-09-15 | Discharge: 2012-09-15 | Disposition: A | Discharge status: Home / Self Care | Payer: Medicare Other | Source: Ambulatory Visit | Attending: Oncology | Admitting: Oncology

## 2012-09-15 VITALS — BP 90/56 | HR 58 | Temp 97.6°F | Resp 20 | Ht 67.0 in | Wt 154.3 lb

## 2012-09-15 DIAGNOSIS — C259 Malignant neoplasm of pancreas, unspecified: Secondary | ICD-10-CM

## 2012-09-15 DIAGNOSIS — R918 Other nonspecific abnormal finding of lung field: Secondary | ICD-10-CM | POA: Insufficient documentation

## 2012-09-15 NOTE — Telephone Encounter (Signed)
Gave pt appt for lab,flush on 7/29, CT and ML on 7/30 per pt rqst , gave oral contrast for CT

## 2012-09-15 NOTE — Progress Notes (Signed)
   [facility]    OFFICE PROGRESS NOTE   INTERVAL HISTORY:   Returns as scheduled. He feels well. Good appetite. He is going to cardiac rehabilitation and walking 1 hour daily. No shortness of breath or fever. No pain. His only complaint is mild "dizziness ". He relates this to low blood pressure. He contacted Dr. Sharyn Lull to adjust the blood pressure medicines.  Objective:  Vital signs in last 24 hours:  Blood pressure 90/56, pulse 58, temperature 97.6 F (36.4 C), temperature source Oral, resp. rate 20, height 5\' 7"  (1.702 m), weight 154 lb 4.8 oz (69.99 kg).   Resp: Lungs clear bilaterally Cardio: Regular rate and rhythm GI: No hepatomegaly, nontender, no mass Vascular: No leg edema  Portacath/PICC-without erythema  Lab Results:  Lab Results  Component Value Date   WBC 9.2 09/01/2012   HGB 11.5* 09/01/2012   HCT 33.8* 09/01/2012   MCV 84.9 09/01/2012   PLT 159 09/01/2012    CA 19-9 on 09/01/2012-1631  X-rays: Chest x-ray 09/15/2012, compared to 08/31/2012-persistent bilateral airspace opacities with slight improvement in the right lower lobe    Medications: I have reviewed the patient's current medications.  Assessment/Plan: 1.Metastatic pancreas cancer-the clinical presentation, elevated CA 19-9, and abdomen CT are consistent with a diagnosis of metastatic pancreas cancer . 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  -initiation of gemcitabine/Abraxane on 07/21/2012, last treated on 08/04/2012  -The CA 19-9 was improved on 09/01/2012 following 3 treatments with gemcitabine/Abraxane 2. Pain secondary to #1- resolved  3. Acute myocardial infarction 06/16/2012-status post PTCA/drug-eluting stent , maintained on Brilinta  4. Status post Port-A-Cath placement 07/16/2012  5. Admission with a high fever and chills on 08/11/2012, a CT of the chest revealed multilobar  infiltrates , diagnosed with chemotherapy induced pneumonitis-improved with steroids , the chest x-ray showed continued improvement on 09/15/2012.   Disposition:  He appears well. The pulmonary infiltrates appear to be slowly resolving. The abdominal pain has resolved and the CA 19-9 is lower. He appears to have responded to the gemcitabine/Abraxane.  We discussed treatment options including observation, single agent Abraxane, or a switch to FOLFOX. The plan is to schedule a restaging CT evaluation and CA 19-9 on 09/29/2012. He will return for an office visit on 10/05/2012. We will make a decision on continued observation versus resuming systemic therapy when he is here on 10/05/2012.   Thornton Papas, MD  09/15/2012  12:11 PM

## 2012-09-17 ENCOUNTER — Encounter (HOSPITAL_COMMUNITY)
Admission: RE | Admit: 2012-09-17 | Discharge: 2012-09-17 | Disposition: A | Discharge status: Home / Self Care | Payer: Medicare Other | Source: Ambulatory Visit | Attending: Cardiology | Admitting: Cardiology

## 2012-09-17 ENCOUNTER — Telehealth: Payer: Self-pay | Admitting: Oncology

## 2012-09-17 NOTE — Telephone Encounter (Signed)
Pt's daughter called and moved lab to 7/30 lab and flush before CT

## 2012-09-20 ENCOUNTER — Encounter (HOSPITAL_COMMUNITY)
Admission: RE | Admit: 2012-09-20 | Discharge: 2012-09-20 | Disposition: A | Discharge status: Home / Self Care | Payer: Medicare Other | Source: Ambulatory Visit | Attending: Cardiology | Admitting: Cardiology

## 2012-09-20 ENCOUNTER — Encounter: Payer: Medicare Other | Admitting: Internal Medicine

## 2012-09-20 NOTE — Progress Notes (Signed)
Randall [name] 72 y.o. male Nutrition Note Spoke with pt.  Nutrition Plan and Nutrition Survey goals reviewed with pt. Pt is following Step 2 of the Therapeutic Lifestyle Changes diet. Pt wt is down approximately 18 lbs from pt's reported UBW of 174#. Pt states he had a consult with Randall [name], RD at the [facility]. Pt was placed on a high calorie, high protein diet with supplements (Ensure) and energy [name] Randall [name]) between meals. Pt encouraged to follow the high calorie, high protein diet until he has gained wt to his UBW and he has been able to maintain that wt. Pt concerned re: high calorie diet and heart disease. Pt concerns addressed. Pt encouraged to increase kcal intake using heart healthy fats, nuts, etc as much as desired. Pt is pre-diabetic according to his last A1c. Pt denies any family history of DM. Pre-diabetes discussed. Pt encouraged to talk with his PCP further re: pre-diabetes. Pt expressed understanding of the information reviewed. Pt aware of nutrition education classes offered.  Nutrition Diagnosis   Food-and nutrition-related knowledge deficit related to lack of exposure to information as related to diagnosis of: ? CVD ? Pre-DM (A1c 6.0)    Unintentional wt loss related to metastatic CA, chemotherapy, and decreased food intake as evidenced by wt loss of 10.3% from pt's UBW over the past 2.5 months.  Nutrition RX/ Estimated Daily Nutrition Needs for: wt gain  2700-3200 Kcal, 75-85 gm fat, 18-21 gm sat fat, 2.7-3.2 gm trans-fat, <1500 mg sodium   Nutrition Intervention   Pt's individual nutrition plan reviewed with pt.   Benefits of adopting Therapeutic Lifestyle Changes discussed when Medficts reviewed.   Pt to attend the Portion Distortion class   Pt to attend the  ? Nutrition I class                     ? Nutrition II class    Pt given handouts for: ? Nutrition I class ? Nutrition II class    Continue client-centered nutrition education by RD, as part of  interdisciplinary care. Goal(s)   Pt to identify food quantities necessary to achieve: ? wt gain to a goal wt of 174 lb (79.1 kg) at graduation from cardiac rehab.  Monitor and Evaluate progress toward nutrition goal with team. Nutrition Risk: Change to Moderate Randall [name], M.Ed, RD, LDN, CDE  09/20/2012 12:19 PM

## 2012-09-21 ENCOUNTER — Encounter: Payer: Self-pay | Admitting: Oncology

## 2012-09-22 ENCOUNTER — Other Ambulatory Visit: Payer: Self-pay | Admitting: *Deleted

## 2012-09-22 ENCOUNTER — Ambulatory Visit: Payer: Medicare Other | Admitting: Internal Medicine

## 2012-09-22 ENCOUNTER — Telehealth: Payer: Self-pay | Admitting: Oncology

## 2012-09-22 NOTE — Telephone Encounter (Signed)
Per 2nd 7/23 pof disregard previous 7/23 pof - ERROR.

## 2012-09-23 ENCOUNTER — Other Ambulatory Visit: Payer: Self-pay | Admitting: Oncology

## 2012-09-23 ENCOUNTER — Ambulatory Visit (INDEPENDENT_AMBULATORY_CARE_PROVIDER_SITE_OTHER): Payer: Medicare Other | Admitting: Internal Medicine

## 2012-09-23 ENCOUNTER — Encounter: Payer: Self-pay | Admitting: Internal Medicine

## 2012-09-23 VITALS — BP 94/60 | HR 64 | Temp 98.2°F | Resp 16 | Ht 67.0 in | Wt 156.0 lb

## 2012-09-23 DIAGNOSIS — Z0000 Encounter for general adult medical examination without abnormal findings: Secondary | ICD-10-CM

## 2012-09-23 DIAGNOSIS — R7309 Other abnormal glucose: Secondary | ICD-10-CM

## 2012-09-23 DIAGNOSIS — D6959 Other secondary thrombocytopenia: Secondary | ICD-10-CM

## 2012-09-23 NOTE — Patient Instructions (Addendum)
  Avoiding raw fruit veggies  Pasturised juices are good  Good caloried with a good heart profile   "naked fruit and veggie smoothies"  Veggie berry  and green machine and mango  And berry blast  protein added one are for you...  Nuts almonds and pistacios  Sweet potatoes Carrots Beets  Lean meats

## 2012-09-23 NOTE — Progress Notes (Signed)
  Subjective:    Patient ID: Randall [name], male    DOB: [birth year], 72 y.o.   MRN: [id number]  HPI Weight loss and moderate malnutrition D2 chemotherapy.  He was admitted to the hospital with a pneumonia and treated he has no sequelae.  He has a history of acute MI on a beta blocker and ACE inhibitor and spironolactone we suggest due to the hypotension to discontinue the spironolactone and the ACE inhibitor   Review of Systems     Objective:   Physical Exam        Assessment & Plan:  Sequelae from chemotherapy noted

## 2012-09-23 NOTE — Progress Notes (Signed)
Subjective:    Patient ID: Randall [name], male    DOB: 15-Mar-1940, 72 y.o.   MRN: [id number]  HPI  Presents for annual examination.  Patient is in treatment for pancreatic cancer.  Patient has hyperlipidemia and hypertension patient has a history of CAD with an acute anterior MI.  Patient recently had spironolactone discontinued due to hypotension probably related to dehydration associated with chemotherapy Also recommend discontinuation of the ACE inhibitor at this time and continuation of the Coreg  Review of Systems  Constitutional: Positive for activity change, appetite change and fatigue. Negative for fever.  HENT: Positive for rhinorrhea. Negative for hearing loss, congestion, neck pain and postnasal drip.   Eyes: Negative for discharge, redness and visual disturbance.  Respiratory: Positive for cough. Negative for shortness of breath and wheezing.   Cardiovascular: Negative for leg swelling.  Gastrointestinal: Positive for constipation. Negative for abdominal pain and abdominal distention.  Genitourinary: Negative for urgency and frequency.  Musculoskeletal: Negative for joint swelling and arthralgias.  Skin: Negative for color change and rash.  Neurological: Positive for numbness. Negative for weakness and light-headedness.  Hematological: Negative for adenopathy.  Psychiatric/Behavioral: Negative for behavioral problems.   Past Medical History  Diagnosis Date  . Anxiety   . Pancreatic cancer metastasized to liver   . MI (myocardial infarction)   . HTN (hypertension)   . Right rotator cuff tear   . Diverticulosis   . Malignant neoplasm of pancreas, part unspecified 07/18/2012    History   Social History  . Marital Status: Divorced    Spouse Name: N/A    Number of Children: 2  . Years of Education: N/A   Occupational History  . retired    Social History Main Topics  . Smoking status: Former Smoker -- 55 years    Types: Cigarettes, Cigars    Quit date:  03/03/2005  . Smokeless tobacco: Never Used     Comment: pt quit cigarettes in 2005 but still smoke cigars  . Alcohol Use: No     Comment: quit 06/16/2012  . Drug Use: No  . Sexually Active: Yes   Other Topics Concern  . Not on file   Social History Narrative  . No narrative on file    Past Surgical History  Procedure Laterality Date  . Cardiac catheterization    . Coronary stent placement      Family History  Problem Relation Age of Onset  . Cancer Mother     oropharengial  . Heart attack Father   . Ovarian cancer Sister   . Anuerysm Brother     AAA    Allergies  Allergen Reactions  . Gemzar (Gemcitabine) Shortness Of Breath    pneumonitis    Current Outpatient Prescriptions on File Prior to Visit  Medication Sig Dispense Refill  . acetaminophen (TYLENOL) 500 MG tablet Take 500-1,000 mg by mouth daily as needed for pain.      Marland Kitchen ALPRAZolam (XANAX) 0.5 MG tablet Take 1 tablet (0.5 mg total) by mouth 3 (three) times daily as needed for anxiety.  90 tablet  1  . aspirin EC 81 MG tablet Take 81 mg by mouth daily.      Marland Kitchen atorvastatin (LIPITOR) 80 MG tablet Take 1 tablet (80 mg total) by mouth daily at 6 PM.  30 tablet  1  . carvedilol (COREG) 6.25 MG tablet Take 1 tablet (6.25 mg total) by mouth 2 (two) times daily with a meal.  60 tablet  1  .  citalopram (CELEXA) 20 MG tablet Take 1 tablet (20 mg total) by mouth daily.  30 tablet  3  . docusate sodium (COLACE) 100 MG capsule Take 100 mg by mouth as needed for constipation.      . ferrous sulfate 325 (65 FE) MG tablet Take 325 mg by mouth 2 (two) times daily.      Marland Kitchen HYDROcodone-acetaminophen (NORCO/VICODIN) 5-325 MG per tablet Take 1 tablet by mouth every 6 (six) hours as needed for pain.  50 tablet  2  . lidocaine-prilocaine (EMLA) cream Apply topically as needed. To PAC prn      . nitroGLYCERIN (NITROSTAT) 0.4 MG SL tablet Place 1 tablet (0.4 mg total) under the tongue every 5 (five) minutes x 3 doses as needed for chest  pain.  25 tablet  0  . prochlorperazine (COMPAZINE) 10 MG tablet Take 1 tablet (10 mg total) by mouth every 6 (six) hours as needed (nausea).  60 tablet  1  . ramipril (ALTACE) 1.25 MG capsule Take 1 capsule (1.25 mg total) by mouth daily.  30 capsule  1  . Ticagrelor (BRILINTA) 90 MG TABS tablet Take 1 tablet (90 mg total) by mouth 2 (two) times daily.  60 tablet  1   No current facility-administered medications on file prior to visit.    BP 94/60  Pulse 64  Temp(Src) 98.2 F (36.8 C)  Resp 16  Ht 5\' 7"  (1.702 m)  Wt 156 lb (70.761 kg)  BMI 24.43 kg/m2        Objective:   Physical Exam  Constitutional: He appears well-developed and well-nourished.  HENT:  Head: Normocephalic and atraumatic.  Eyes: Conjunctivae are normal. Pupils are equal, round, and reactive to light.  Neck: Normal range of motion. Neck supple.  Cardiovascular: Normal rate and regular rhythm.   Murmur heard. Pulmonary/Chest: Effort normal and breath sounds normal.  Abdominal: Soft. Bowel sounds are normal.          Assessment & Plan:  For discontinuation of Aldactone and  altace  dueto hypotension   Patient presents for yearly preventative medicine examination.   all immunizations and health maintenance protocols were reviewed with the patient and they are up to date with these protocols.   screening laboratory values were reviewed with the patient including screening of hyperlipidemia PSA renal function and hepatic function.   There medications past medical history social history problem list and allergies were reviewed in detail.   Goals were established with regard to exercise diet in compliance with medications

## 2012-09-24 ENCOUNTER — Encounter (HOSPITAL_COMMUNITY)
Admission: RE | Admit: 2012-09-24 | Discharge: 2012-09-24 | Disposition: A | Discharge status: Home / Self Care | Payer: Medicare Other | Source: Ambulatory Visit | Attending: Cardiology | Admitting: Cardiology

## 2012-09-27 ENCOUNTER — Encounter (HOSPITAL_COMMUNITY): Payer: Medicare Other

## 2012-09-27 ENCOUNTER — Ambulatory Visit: Payer: Self-pay | Admitting: Internal Medicine

## 2012-09-28 ENCOUNTER — Encounter: Payer: Self-pay | Admitting: Pulmonary Disease

## 2012-09-28 ENCOUNTER — Other Ambulatory Visit: Payer: Self-pay | Admitting: Lab

## 2012-09-28 ENCOUNTER — Ambulatory Visit (INDEPENDENT_AMBULATORY_CARE_PROVIDER_SITE_OTHER)
Admission: RE | Admit: 2012-09-28 | Discharge: 2012-09-28 | Disposition: A | Discharge status: Home / Self Care | Payer: Medicare Other | Source: Ambulatory Visit | Attending: Pulmonary Disease | Admitting: Pulmonary Disease

## 2012-09-28 ENCOUNTER — Ambulatory Visit (INDEPENDENT_AMBULATORY_CARE_PROVIDER_SITE_OTHER): Payer: Medicare Other | Admitting: Pulmonary Disease

## 2012-09-28 VITALS — BP 102/62 | HR 65 | Temp 97.8°F | Ht 67.0 in | Wt 160.4 lb

## 2012-09-28 DIAGNOSIS — J690 Pneumonitis due to inhalation of food and vomit: Secondary | ICD-10-CM

## 2012-09-28 DIAGNOSIS — J189 Pneumonia, unspecified organism: Secondary | ICD-10-CM

## 2012-09-28 DIAGNOSIS — J438 Other emphysema: Secondary | ICD-10-CM

## 2012-09-28 DIAGNOSIS — J439 Emphysema, unspecified: Secondary | ICD-10-CM | POA: Insufficient documentation

## 2012-09-28 MED ORDER — IPRATROPIUM-ALBUTEROL 20-100 MCG/ACT IN AERS: 1.0000 | INHALATION_SPRAY | Freq: Four times a day (QID) | RESPIRATORY_TRACT | Status: DC

## 2012-09-28 NOTE — Assessment & Plan Note (Signed)
He has evidence for moderate obstruction on spirometry today.  He has minimal respiratory symptoms at present (but may be under-reporting his symptoms).  Will give him trial of combivent >> advised him to call in few weeks to update status, and then discuss if he needs maintenance inhaler therapy.

## 2012-09-28 NOTE — Progress Notes (Signed)
Chief Complaint  Patient presents with  . Follow-up    for [place]. Pt denies any SOB, cough, congestion at this time.    History of Present Illness: Randall [name] is a 72 y.o. male former smoker with hx of pancreatic cancer and pulmonary infiltrates.  He was in hospital in June 2014 with dyspnea, hypoxia, fever, and pulmonary infiltrates.  Concern at that time was infection versus chemotherapy induce pneumonitis (Gemzar/Abraxane ) versus pulmonary edema.  He was treated with prednisone, antibiotics, tamiflu, and diuresis.  His breathing has been doing better.  He denies wheeze, sputum, fever, hemoptysis, or chest pain.  He goes for 30 minute walk twice per day, and is doing better with this.  He does get a dry cough.    TESTS: Echo 06/17/12 >> EF 40 to 45%, mild MR CT chest 08/11/12 >> coronary calcification, mild apical scarring, centrilobular and paraseptal emphysema, patchy b/l GGO, 9 mm LLL nodule, numerous nodules in liver up to 4.4 cm Spirometry 09/28/12 >> FEV1 1.90 (61%), FEV1% 68  Randall [name]  has a past medical history of Anxiety; Pancreatic cancer metastasized to liver; MI (myocardial infarction); HTN (hypertension); Right rotator cuff tear; Diverticulosis; and Malignant neoplasm of pancreas, part unspecified (07/18/2012).  Randall [name]  has past surgical history that includes Cardiac catheterization and Coronary stent placement.  Prior to Admission medications   Medication Sig Start Date End Date Taking? Authorizing Provider  acetaminophen (TYLENOL) 500 MG tablet Take 500-1,000 mg by mouth daily as needed for pain.   Yes Historical Provider, MD  ALPRAZolam Prudy Feeler) 0.5 MG tablet Take 1 tablet (0.5 mg total) by mouth 3 (three) times daily as needed for anxiety. 09/09/12  Yes Stacie Glaze, MD  aspirin EC 81 MG tablet Take 81 mg by mouth daily.   Yes Historical Provider, MD  atorvastatin (LIPITOR) 80 MG tablet Take 1 tablet (80 mg total) by mouth daily at 6 PM. 06/19/12   Yes Ricki Rodriguez, MD  carvedilol (COREG) 6.25 MG tablet Take 1 tablet (6.25 mg total) by mouth 2 (two) times daily with a meal. 06/19/12  Yes Ricki Rodriguez, MD  citalopram (CELEXA) 20 MG tablet Take 1 tablet (20 mg total) by mouth daily. 08/11/12  Yes Stacie Glaze, MD  docusate sodium (COLACE) 100 MG capsule Take 100 mg by mouth as needed for constipation.   Yes Historical Provider, MD  ferrous sulfate 325 (65 FE) MG tablet Take 325 mg by mouth 2 (two) times daily. 08/18/12  Yes Robynn Pane, MD  HYDROcodone-acetaminophen (NORCO/VICODIN) 5-325 MG per tablet Take 1 tablet by mouth every 6 (six) hours as needed for pain. 07/09/12  Yes Ladene Artist, MD  lidocaine-prilocaine (EMLA) cream Apply topically as needed. To PAC prn 07/13/12  Yes Historical Provider, MD  nitroGLYCERIN (NITROSTAT) 0.4 MG SL tablet Place 1 tablet (0.4 mg total) under the tongue every 5 (five) minutes x 3 doses as needed for chest pain. 06/19/12  Yes Ricki Rodriguez, MD  prochlorperazine (COMPAZINE) 10 MG tablet Take 1 tablet (10 mg total) by mouth every 6 (six) hours as needed (nausea). 07/13/12  Yes Ladene Artist, MD  ramipril (ALTACE) 1.25 MG capsule Take 1 capsule (1.25 mg total) by mouth daily. 06/19/12  Yes Ricki Rodriguez, MD  Ticagrelor (BRILINTA) 90 MG TABS tablet Take 1 tablet (90 mg total) by mouth 2 (two) times daily. 06/19/12  Yes Ricki Rodriguez, MD    Allergies  Allergen Reactions  .  Gemzar (Gemcitabine) Shortness Of Breath    pneumonitis     Physical Exam:  General - No distress ENT - No sinus tenderness, no oral exudate, no LAN Cardiac - s1s2 regular, no murmur Chest - prolonged exhalation, no wheeze/rales/dullness Back - No focal tenderness Abd - Soft, non-tender Ext - No edema Neuro - Normal strength Skin - No rashes Psych - normal mood, and behavior  Dg Chest 2 View  09/28/2012   *RADIOLOGY REPORT*  Clinical Data: Prior tobacco use, follow up pneumonia  CHEST - 2 VIEW  Comparison: 09/15/2012   Findings: The heart and pulmonary vascularity are within normal limits.  A right chest wall port is again seen.  Patchy changes are again noted in the left apex and right lung base stable from prior exam.  No new focal infiltrate is seen.  No sizable effusion is noted.  IMPRESSION: Stable bilateral opacities.  The need for further follow-up can be determined on a clinical basis.   Original Report Authenticated By: Alcide Clever, M.D.    Assessment/Plan:  Coralyn Helling, MD [facility] Pager:  [phone]

## 2012-09-28 NOTE — Assessment & Plan Note (Signed)
He has improved clinically and radiographically from his recent episode of pneumonitis in June 2014.  I don't think he needs any additional therapy for this.  Concern again is for either viral pneumonia versus chemotherapy induced.  I favor viral pneumonia, but can not exclude possibility of chemotherapy reaction.  Discussed implications of this with patient regarding chemotherapy options for his pancreatic cancer.  Explained that would be best to try alternative chemotherapy if possible >> defer decision to oncology.

## 2012-09-28 NOTE — Patient Instructions (Signed)
Combivent 1 puff up to four times per day as needed for cough, wheeze, shortness of breath, or chest tightness >> try using this before exercising for the next few days Call to update status in 2 weeks  Follow up in 2 months

## 2012-09-29 ENCOUNTER — Other Ambulatory Visit (HOSPITAL_BASED_OUTPATIENT_CLINIC_OR_DEPARTMENT_OTHER): Payer: Medicare Other

## 2012-09-29 ENCOUNTER — Other Ambulatory Visit: Payer: Self-pay | Admitting: Lab

## 2012-09-29 ENCOUNTER — Ambulatory Visit: Payer: Self-pay | Admitting: Nurse Practitioner

## 2012-09-29 ENCOUNTER — Ambulatory Visit (HOSPITAL_BASED_OUTPATIENT_CLINIC_OR_DEPARTMENT_OTHER): Payer: Medicare Other

## 2012-09-29 ENCOUNTER — Encounter (HOSPITAL_COMMUNITY): Payer: Self-pay

## 2012-09-29 ENCOUNTER — Ambulatory Visit (HOSPITAL_BASED_OUTPATIENT_CLINIC_OR_DEPARTMENT_OTHER): Payer: Medicare Other | Admitting: Nurse Practitioner

## 2012-09-29 ENCOUNTER — Ambulatory Visit (HOSPITAL_COMMUNITY)
Admission: RE | Admit: 2012-09-29 | Discharge: 2012-09-29 | Disposition: A | Discharge status: Home / Self Care | Payer: Medicare Other | Source: Ambulatory Visit | Attending: Oncology | Admitting: Oncology

## 2012-09-29 VITALS — BP 96/59 | HR 65 | Temp 96.8°F | Resp 20 | Ht 67.0 in | Wt 161.1 lb

## 2012-09-29 VITALS — BP 114/65 | HR 67 | Temp 98.7°F

## 2012-09-29 DIAGNOSIS — K5730 Diverticulosis of large intestine without perforation or abscess without bleeding: Secondary | ICD-10-CM | POA: Insufficient documentation

## 2012-09-29 DIAGNOSIS — C259 Malignant neoplasm of pancreas, unspecified: Secondary | ICD-10-CM

## 2012-09-29 DIAGNOSIS — R911 Solitary pulmonary nodule: Secondary | ICD-10-CM | POA: Insufficient documentation

## 2012-09-29 DIAGNOSIS — K55059 Acute (reversible) ischemia of intestine, part and extent unspecified: Secondary | ICD-10-CM | POA: Insufficient documentation

## 2012-09-29 DIAGNOSIS — N200 Calculus of kidney: Secondary | ICD-10-CM | POA: Insufficient documentation

## 2012-09-29 DIAGNOSIS — C787 Secondary malignant neoplasm of liver and intrahepatic bile duct: Secondary | ICD-10-CM | POA: Insufficient documentation

## 2012-09-29 DIAGNOSIS — J438 Other emphysema: Secondary | ICD-10-CM | POA: Insufficient documentation

## 2012-09-29 DIAGNOSIS — M4310 Spondylolisthesis, site unspecified: Secondary | ICD-10-CM | POA: Insufficient documentation

## 2012-09-29 DIAGNOSIS — R599 Enlarged lymph nodes, unspecified: Secondary | ICD-10-CM | POA: Insufficient documentation

## 2012-09-29 DIAGNOSIS — K4020 Bilateral inguinal hernia, without obstruction or gangrene, not specified as recurrent: Secondary | ICD-10-CM | POA: Insufficient documentation

## 2012-09-29 LAB — COMPREHENSIVE METABOLIC PANEL (CC13)
ALT: 21 U/L (ref 0–55)
AST: 23 U/L (ref 5–34)
CO2: 24 mEq/L (ref 22–29)
Creatinine: 0.8 mg/dL (ref 0.7–1.3)
Total Bilirubin: 0.58 mg/dL (ref 0.20–1.20)

## 2012-09-29 LAB — CBC WITH DIFFERENTIAL/PLATELET
BASO%: 0.4 % (ref 0.0–2.0)
EOS%: 3.1 % (ref 0.0–7.0)
HCT: 29.7 % — ABNORMAL LOW (ref 38.4–49.9)
LYMPH%: 19.2 % (ref 14.0–49.0)
MCH: 29.9 pg (ref 27.2–33.4)
MCHC: 34.1 g/dL (ref 32.0–36.0)
MCV: 87.6 fL (ref 79.3–98.0)
MONO%: 9.8 % (ref 0.0–14.0)
NEUT%: 67.5 % (ref 39.0–75.0)
Platelets: 147 10*3/uL (ref 140–400)

## 2012-09-29 LAB — CANCER ANTIGEN 19-9: CA 19-9: 2105.9 U/mL — ABNORMAL HIGH (ref <35.0)

## 2012-09-29 MED ORDER — IOHEXOL 300 MG/ML  SOLN
100.0000 mL | Freq: Once | INTRAMUSCULAR | Status: AC | PRN
  Administered 2012-09-29: 100 mL via INTRAVENOUS

## 2012-09-29 MED ORDER — SODIUM CHLORIDE 0.9 % IJ SOLN
10.0000 mL | INTRAMUSCULAR | Status: DC | PRN
  Administered 2012-09-29: 10 mL via INTRAVENOUS
  Filled 2012-09-29: qty 10

## 2012-09-29 NOTE — Progress Notes (Signed)
OFFICE PROGRESS NOTE  Interval history:  Mr. Senger returns as scheduled. He feels well. He has a good appetite and good energy level. He denies pain. No nausea or vomiting. No shortness of breath or cough.   Objective: Blood pressure 96/59, pulse 65, temperature 96.8 F (36 C), temperature source Oral, resp. rate 20, height 5\' 7"  (1.702 m), weight 161 lb 1.6 oz (73.074 kg).  Oropharynx is without thrush or ulceration. Lungs are clear. Regular cardiac rhythm. Abdomen soft and nontender. No hepatomegaly. Extremities are without edema.  Lab Results: Lab Results  Component Value Date   WBC 6.5 09/29/2012   HGB 10.1* 09/29/2012   HCT 29.7* 09/29/2012   MCV 87.6 09/29/2012   PLT 147 09/29/2012    Chemistry:    Chemistry      Component Value Date/Time   NA 139 09/29/2012 0916   NA 136 08/18/2012 0457   K 4.5 09/29/2012 0916   K 4.5 08/18/2012 0457   CL 102 08/18/2012 0457   CL 103 08/04/2012 0955   CO2 24 09/29/2012 0916   CO2 27 08/18/2012 0457   BUN 16.7 09/29/2012 0916   BUN 30* 08/18/2012 0457   CREATININE 0.8 09/29/2012 0916   CREATININE 0.73 08/18/2012 0457      Component Value Date/Time   CALCIUM 9.0 09/29/2012 0916   CALCIUM 8.9 08/18/2012 0457   ALKPHOS 93 09/29/2012 0916   ALKPHOS 145* 08/15/2012 0351   AST 23 09/29/2012 0916   AST 58* 08/15/2012 0351   ALT 21 09/29/2012 0916   ALT 85* 08/15/2012 0351   BILITOT 0.58 09/29/2012 0916   BILITOT 0.4 08/15/2012 0351       Studies/Results: Dg Chest 2 View  09/28/2012   *RADIOLOGY REPORT*  Clinical Data: Prior tobacco use, follow up pneumonia  CHEST - 2 VIEW  Comparison: 09/15/2012  Findings: The heart and pulmonary vascularity are within normal limits.  A right chest wall port is again seen.  Patchy changes are again noted in the left apex and right lung base stable from prior exam.  No new focal infiltrate is seen.  No sizable effusion is noted.  IMPRESSION: Stable bilateral opacities.  The need for further follow-up can be determined on  a clinical basis.   Original Report Authenticated By: Alcide Clever, M.D.   Dg Chest 2 View  09/15/2012   *RADIOLOGY REPORT*  Clinical Data: Follow up pulmonary infiltrates.  CHEST - 2 VIEW  Comparison: Chest x-ray 08/31/2012 and chest CT 08/11/2012.  Findings: The power port is stable.  The cardiac silhouette, mediastinal and hilar contours are within normal limits and unchanged.  There are persistent bilateral airspace opacities with slight interval improvement of the right lower lobe process.  No pleural effusions or pneumothorax.  Underlying emphysematous changes.  IMPRESSION: Persistent bilateral airspace opacities with slight improvement in the right lower lobe.   Original Report Authenticated By: Rudie Meyer, M.D.   Dg Chest 2 View  08/31/2012   *RADIOLOGY REPORT*  Clinical Data: Pneumonia.  CHEST - 2 VIEW  Comparison: 06/17 and 08/13/2012  Findings: There has been significant partial resolution of the bilateral pulmonary infiltrates.  There is still infiltrate in the right lung base posteriorly and there is still slight infiltrate in the left upper lobe.  Heart size and vascularity are normal.  No effusions.  Power port in place.  No acute osseous abnormality.  IMPRESSION: Marked improvement in the bilateral pulmonary infiltrates. Infiltrate does persist at the right base and left apex.  Original Report Authenticated By: Francene Boyers, M.D.   Ct Chest W Contrast  09/29/2012   *RADIOLOGY REPORT*  Clinical Data:  Pancreatic cancer  CT CHEST, ABDOMEN AND PELVIS WITH CONTRAST  Technique:  Multidetector CT imaging of the chest, abdomen and pelvis was performed following the standard protocol during bolus administration of intravenous contrast.  Contrast: OMNIPAQUE IOHEXOL 300 MG/ML  SOLN  Comparison:  Chest CT from 08/11/2012.  Abdomen pelvis CT from 06/30/2012.  CT CHEST  Findings:  The tip of the right-sided Port-A-Cath is positioned in the distal SVC.  No axillary, mediastinal, or hilar  lymphadenopathy.  The heart size is normal.  There is no pericardial or pleural effusion.  Lung windows again show bilateral paraseptal emphysema with patchy areas of chronic underlying interstitial lung disease.  Previously characterized left lower lobe pulmonary nodule has decreased since 08/11/2012 and now measures 6 mm in this same dimension.  No new or progressive pulmonary nodule is evident.  Bone windows reveal no worrisome lytic or sclerotic osseous lesions.  IMPRESSION: Interval decrease in the left lower lobe pulmonary nodule.  Stable appearance of emphysema and scattered interstitial and ground-glass alveolar attenuation.  CT ABDOMEN AND PELVIS  Findings:  Multiple low-density liver lesions are identified.  A dominant lesion in the lateral segment left liver measures 3.1 cm. This represents the 4.4 cm lesion from 08/11/2012.  A 1.9 cm lesion in the peripheral right liver (image 57) was 2.3 cm previously. This particular lesion was 2.6 cm in diameter back on the study from 06/30/2012. No new or enlarging liver lesions are evident.  The hypo enhancing lesion in the uncinate process of the pancreas is again identified.  This is decreased in the interval measuring 4.2 x 1.6 cm today in the same dimensions that it measured 6.2 x 2.7 cm previously. Peri pancreatic lymphadenopathy is decreased in the interval.  The spleen, stomach, duodenum, and gallbladder are unremarkable. Small nonobstructing stones are noted in the kidneys bilaterally. Bilateral adrenal gland thickening is stable without a discrete adrenal nodule or mass.  As noted previously, there is a filling defect in the superior mesenteric vein which is present on both the portal venous phase study (see image 105 of series 6) and on the renal delay sequence (see image 11 of series 7).  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 Main portal vein is patent although I think there is a tiny amount of non occlusive thrombus in the left  portal vein (see images 95 and 96 of series 6).  Wall irregularity in the abdominal aorta is compatible with atherosclerosis and abdominal aorta measures up to 3.1 cm in diameter, mildly aneurysmal.  There is no lymphadenopathy in the abdomen.  No abdominal free fluid.  Imaging through the pelvis shows no free intraperitoneal fluid. There is no pelvic sidewall lymphadenopathy.  Bladder is unremarkable.  Diverticular disease is noted in the left colon without diverticulitis and some diverticuli are noted in the right colon is well.  The terminal ileum is normal.  The appendix is normal  Bilateral inguinal hernias contain only fat  Bone windows reveal no worrisome lytic or sclerotic osseous lesions.  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  IMPRESSION: Interval decrease in size of the pancreatic head mass with decreasing regional lymphadenopathy and decreasing metastatic disease within the liver.  No new or progressive neoplastic disease on today's study.  As before, there is nonocclusive thrombus identified within the  superior mesenteric vein and probably a trace amount of nonocclusive thrombus in the left portal vein.   Original Report Authenticated By: Kennith Center, M.D.   Ct Abdomen Pelvis W Contrast  09/29/2012   *RADIOLOGY REPORT*  Clinical Data:  Pancreatic cancer  CT CHEST, ABDOMEN AND PELVIS WITH CONTRAST  Technique:  Multidetector CT imaging of the chest, abdomen and pelvis was performed following the standard protocol during bolus administration of intravenous contrast.  Contrast: OMNIPAQUE IOHEXOL 300 MG/ML  SOLN  Comparison:  Chest CT from 08/11/2012.  Abdomen pelvis CT from 06/30/2012.  CT CHEST  Findings:  The tip of the right-sided Port-A-Cath is positioned in the distal SVC.  No axillary, mediastinal, or hilar lymphadenopathy.  The heart size is normal.  There is no pericardial or pleural effusion.  Lung windows again show bilateral  paraseptal emphysema with patchy areas of chronic underlying interstitial lung disease.  Previously characterized left lower lobe pulmonary nodule has decreased since 08/11/2012 and now measures 6 mm in this same dimension.  No new or progressive pulmonary nodule is evident.  Bone windows reveal no worrisome lytic or sclerotic osseous lesions.  IMPRESSION: Interval decrease in the left lower lobe pulmonary nodule.  Stable appearance of emphysema and scattered interstitial and ground-glass alveolar attenuation.  CT ABDOMEN AND PELVIS  Findings:  Multiple low-density liver lesions are identified.  A dominant lesion in the lateral segment left liver measures 3.1 cm. This represents the 4.4 cm lesion from 08/11/2012.  A 1.9 cm lesion in the peripheral right liver (image 57) was 2.3 cm previously. This particular lesion was 2.6 cm in diameter back on the study from 06/30/2012. No new or enlarging liver lesions are evident.  The hypo enhancing lesion in the uncinate process of the pancreas is again identified.  This is decreased in the interval measuring 4.2 x 1.6 cm today in the same dimensions that it measured 6.2 x 2.7 cm previously. Peri pancreatic lymphadenopathy is decreased in the interval.  The spleen, stomach, duodenum, and gallbladder are unremarkable. Small nonobstructing stones are noted in the kidneys bilaterally. Bilateral adrenal gland thickening is stable without a discrete adrenal nodule or mass.  As noted previously, there is a filling defect in the superior mesenteric vein which is present on both the portal venous phase study (see image 105 of series 6) and on the renal delay sequence (see image 11 of series 7).  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 Main portal vein is patent although I think there is a tiny amount of non occlusive thrombus in the left portal vein (see images 95 and 96 of series 6).  Wall irregularity in the abdominal aorta is compatible with atherosclerosis  and abdominal aorta measures up to 3.1 cm in diameter, mildly aneurysmal.  There is no lymphadenopathy in the abdomen.  No abdominal free fluid.  Imaging through the pelvis shows no free intraperitoneal fluid. There is no pelvic sidewall lymphadenopathy.  Bladder is unremarkable.  Diverticular disease is noted in the left colon without diverticulitis and some diverticuli are noted in the right colon is well.  The terminal ileum is normal.  The appendix is normal  Bilateral inguinal hernias contain only fat  Bone windows reveal no worrisome lytic or sclerotic osseous lesions.  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  IMPRESSION: Interval decrease in size of the pancreatic head mass with decreasing regional lymphadenopathy and decreasing metastatic disease within the liver.  No new or progressive neoplastic disease on today's study.  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   Original Report Authenticated By: Kennith Center, M.D.    Medications: I have reviewed the patient's current medications.  Assessment/Plan:  1. Metastatic pancreas cancer. The clinical presentation, elevated CA 19-9 and abdominal CT consistent with a diagnosis of metastatic pancreas cancer. 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  Initiation of gemcitabine/Abraxane on 07/21/2012. Last treated on 08/04/2012.  The CA 19-9 was improved on 09/01/2012 following 3 treatments with gemcitabine/Abraxane.  Restaging CT evaluation 09/29/2012 showed interval decrease in the left lower lobe pulmonary nodule; interval decrease in the size of the pancreatic head mass with decreasing regional lymphadenopathy and decreasing metastatic disease within the liver. No new or  progressive disease noted. 2. Pain secondary to #1. Resolved. 3. Acute myocardial infarction 06/16/2012 status post PTCA/drug-eluting stent, maintained on Brilinta. 4. Status post Port-A-Cath placement 07/16/2012. 5. Admission with high fever and chills on 08/11/2012. CT of the chest revealed multilobar infiltrates. He was diagnosed with chemotherapy induced pneumonitis. He improved with steroids. Chest x-ray 09/15/2012 showed continued improvement.  Disposition-Randall [name] appears well. The restaging CT evaluation showed interval improvement. I reviewed the CT results with Mr. Baca and his daughter. He will return for a followup visit on 10/07/2012 with Dr. Truett Perna to discuss treatment options including observation, single agent Abraxane or a switch to FOLFOX. His initial impression is to continue with chemotherapy.  He will contact the office prior to his next visit with any problems.     Lonna Cobb ANP/GNP-BC

## 2012-10-01 ENCOUNTER — Encounter (HOSPITAL_COMMUNITY)
Admission: RE | Admit: 2012-10-01 | Discharge: 2012-10-01 | Disposition: A | Discharge status: Home / Self Care | Payer: Medicare Other | Source: Ambulatory Visit | Attending: Cardiology | Admitting: Cardiology

## 2012-10-01 DIAGNOSIS — Z8249 Family history of ischemic heart disease and other diseases of the circulatory system: Secondary | ICD-10-CM | POA: Insufficient documentation

## 2012-10-01 DIAGNOSIS — I2510 Atherosclerotic heart disease of native coronary artery without angina pectoris: Secondary | ICD-10-CM | POA: Insufficient documentation

## 2012-10-01 DIAGNOSIS — Z5189 Encounter for other specified aftercare: Secondary | ICD-10-CM | POA: Insufficient documentation

## 2012-10-01 DIAGNOSIS — I252 Old myocardial infarction: Secondary | ICD-10-CM | POA: Insufficient documentation

## 2012-10-04 ENCOUNTER — Encounter (HOSPITAL_COMMUNITY)
Admission: RE | Admit: 2012-10-04 | Discharge: 2012-10-04 | Disposition: A | Discharge status: Home / Self Care | Payer: Medicare Other | Source: Ambulatory Visit | Attending: Cardiology | Admitting: Cardiology

## 2012-10-07 ENCOUNTER — Telehealth: Payer: Self-pay | Admitting: Oncology

## 2012-10-07 ENCOUNTER — Ambulatory Visit (HOSPITAL_BASED_OUTPATIENT_CLINIC_OR_DEPARTMENT_OTHER): Payer: Medicare Other | Admitting: Nurse Practitioner

## 2012-10-07 VITALS — BP 103/59 | HR 58 | Temp 98.2°F | Resp 20 | Ht 67.0 in | Wt 160.1 lb

## 2012-10-07 DIAGNOSIS — C259 Malignant neoplasm of pancreas, unspecified: Secondary | ICD-10-CM

## 2012-10-07 DIAGNOSIS — C787 Secondary malignant neoplasm of liver and intrahepatic bile duct: Secondary | ICD-10-CM

## 2012-10-07 NOTE — Progress Notes (Addendum)
OFFICE PROGRESS NOTE  Interval history:  Mr. Schulenburg returns as scheduled. He continues to feel well. He denies abdominal pain. He has a good appetite. He remains very active. No shortness of breath or cough.   Objective: Blood pressure 103/59, pulse 58, temperature 98.2 F (36.8 C), temperature source Oral, resp. rate 20, height 5\' 7"  (1.702 m), weight 160 lb 1.6 oz (72.621 kg), SpO2 95.00%.  No thrush. Lungs are clear. Regular cardiac rhythm. Port-A-Cath site is without erythema. Abdomen is soft and nontender. No hepatomegaly. Extremities without edema.  Lab Results: Lab Results  Component Value Date   WBC 6.5 09/29/2012   HGB 10.1* 09/29/2012   HCT 29.7* 09/29/2012   MCV 87.6 09/29/2012   PLT 147 09/29/2012    Chemistry:    Chemistry      Component Value Date/Time   NA 139 09/29/2012 0916   NA 136 08/18/2012 0457   K 4.5 09/29/2012 0916   K 4.5 08/18/2012 0457   CL 102 08/18/2012 0457   CL 103 08/04/2012 0955   CO2 24 09/29/2012 0916   CO2 27 08/18/2012 0457   BUN 16.7 09/29/2012 0916   BUN 30* 08/18/2012 0457   CREATININE 0.8 09/29/2012 0916   CREATININE 0.73 08/18/2012 0457      Component Value Date/Time   CALCIUM 9.0 09/29/2012 0916   CALCIUM 8.9 08/18/2012 0457   ALKPHOS 93 09/29/2012 0916   ALKPHOS 145* 08/15/2012 0351   AST 23 09/29/2012 0916   AST 58* 08/15/2012 0351   ALT 21 09/29/2012 0916   ALT 85* 08/15/2012 0351   BILITOT 0.58 09/29/2012 0916   BILITOT 0.4 08/15/2012 0351       Studies/Results: Dg Chest 2 View  09/28/2012   *RADIOLOGY REPORT*  Clinical Data: Prior tobacco use, follow up pneumonia  CHEST - 2 VIEW  Comparison: 09/15/2012  Findings: The heart and pulmonary vascularity are within normal limits.  A right chest wall port is again seen.  Patchy changes are again noted in the left apex and right lung base stable from prior exam.  No new focal infiltrate is seen.  No sizable effusion is noted.  IMPRESSION: Stable bilateral opacities.  The need for further follow-up  can be determined on a clinical basis.   Original Report Authenticated By: Alcide Clever, M.D.   Dg Chest 2 View  09/15/2012   *RADIOLOGY REPORT*  Clinical Data: Follow up pulmonary infiltrates.  CHEST - 2 VIEW  Comparison: Chest x-ray 08/31/2012 and chest CT 08/11/2012.  Findings: The power port is stable.  The cardiac silhouette, mediastinal and hilar contours are within normal limits and unchanged.  There are persistent bilateral airspace opacities with slight interval improvement of the right lower lobe process.  No pleural effusions or pneumothorax.  Underlying emphysematous changes.  IMPRESSION: Persistent bilateral airspace opacities with slight improvement in the right lower lobe.   Original Report Authenticated By: Rudie Meyer, M.D.   Ct Chest W Contrast  09/29/2012   *RADIOLOGY REPORT*  Clinical Data:  Pancreatic cancer  CT CHEST, ABDOMEN AND PELVIS WITH CONTRAST  Technique:  Multidetector CT imaging of the chest, abdomen and pelvis was performed following the standard protocol during bolus administration of intravenous contrast.  Contrast: OMNIPAQUE IOHEXOL 300 MG/ML  SOLN  Comparison:  Chest CT from 08/11/2012.  Abdomen pelvis CT from 06/30/2012.  CT CHEST  Findings:  The tip of the right-sided Port-A-Cath is positioned in the distal SVC.  No axillary, mediastinal, or hilar lymphadenopathy.  The heart  size is normal.  There is no pericardial or pleural effusion.  Lung windows again show bilateral paraseptal emphysema with patchy areas of chronic underlying interstitial lung disease.  Previously characterized left lower lobe pulmonary nodule has decreased since 08/11/2012 and now measures 6 mm in this same dimension.  No new or progressive pulmonary nodule is evident.  Bone windows reveal no worrisome lytic or sclerotic osseous lesions.  IMPRESSION: Interval decrease in the left lower lobe pulmonary nodule.  Stable appearance of emphysema and scattered interstitial and ground-glass alveolar  attenuation.  CT ABDOMEN AND PELVIS  Findings:  Multiple low-density liver lesions are identified.  A dominant lesion in the lateral segment left liver measures 3.1 cm. This represents the 4.4 cm lesion from 08/11/2012.  A 1.9 cm lesion in the peripheral right liver (image 57) was 2.3 cm previously. This particular lesion was 2.6 cm in diameter back on the study from 06/30/2012. No new or enlarging liver lesions are evident.  The hypo enhancing lesion in the uncinate process of the pancreas is again identified.  This is decreased in the interval measuring 4.2 x 1.6 cm today in the same dimensions that it measured 6.2 x 2.7 cm previously. Peri pancreatic lymphadenopathy is decreased in the interval.  The spleen, stomach, duodenum, and gallbladder are unremarkable. Small nonobstructing stones are noted in the kidneys bilaterally. Bilateral adrenal gland thickening is stable without a discrete adrenal nodule or mass.  As noted previously, there is a filling defect in the superior mesenteric vein which is present on both the portal venous phase study (see image 105 of series 6) and on the renal delay sequence (see image 11 of series 7).  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 Main portal vein is patent although I think there is a tiny amount of non occlusive thrombus in the left portal vein (see images 95 and 96 of series 6).  Wall irregularity in the abdominal aorta is compatible with atherosclerosis and abdominal aorta measures up to 3.1 cm in diameter, mildly aneurysmal.  There is no lymphadenopathy in the abdomen.  No abdominal free fluid.  Imaging through the pelvis shows no free intraperitoneal fluid. There is no pelvic sidewall lymphadenopathy.  Bladder is unremarkable.  Diverticular disease is noted in the left colon without diverticulitis and some diverticuli are noted in the right colon is well.  The terminal ileum is normal.  The appendix is normal  Bilateral inguinal hernias contain  only fat  Bone windows reveal no worrisome lytic or sclerotic osseous lesions.  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  IMPRESSION: Interval decrease in size of the pancreatic head mass with decreasing regional lymphadenopathy and decreasing metastatic disease within the liver.  No new or progressive neoplastic disease on today's study.  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   Original Report Authenticated By: Kennith Center, M.D.   Ct Abdomen Pelvis W Contrast  09/29/2012   *RADIOLOGY REPORT*  Clinical Data:  Pancreatic cancer  CT CHEST, ABDOMEN AND PELVIS WITH CONTRAST  Technique:  Multidetector CT imaging of the chest, abdomen and pelvis was performed following the standard protocol during bolus administration of intravenous contrast.  Contrast: OMNIPAQUE IOHEXOL 300 MG/ML  SOLN  Comparison:  Chest CT from 08/11/2012.  Abdomen pelvis CT from 06/30/2012.  CT CHEST  Findings:  The tip of  the right-sided Port-A-Cath is positioned in the distal SVC.  No axillary, mediastinal, or hilar lymphadenopathy.  The heart size is normal.  There is no pericardial or pleural effusion.  Lung windows again show bilateral paraseptal emphysema with patchy areas of chronic underlying interstitial lung disease.  Previously characterized left lower lobe pulmonary nodule has decreased since 08/11/2012 and now measures 6 mm in this same dimension.  No new or progressive pulmonary nodule is evident.  Bone windows reveal no worrisome lytic or sclerotic osseous lesions.  IMPRESSION: Interval decrease in the left lower lobe pulmonary nodule.  Stable appearance of emphysema and scattered interstitial and ground-glass alveolar attenuation.  CT ABDOMEN AND PELVIS  Findings:  Multiple low-density liver lesions are identified.  A dominant lesion in the  lateral segment left liver measures 3.1 cm. This represents the 4.4 cm lesion from 08/11/2012.  A 1.9 cm lesion in the peripheral right liver (image 57) was 2.3 cm previously. This particular lesion was 2.6 cm in diameter back on the study from 06/30/2012. No new or enlarging liver lesions are evident.  The hypo enhancing lesion in the uncinate process of the pancreas is again identified.  This is decreased in the interval measuring 4.2 x 1.6 cm today in the same dimensions that it measured 6.2 x 2.7 cm previously. Peri pancreatic lymphadenopathy is decreased in the interval.  The spleen, stomach, duodenum, and gallbladder are unremarkable. Small nonobstructing stones are noted in the kidneys bilaterally. Bilateral adrenal gland thickening is stable without a discrete adrenal nodule or mass.  As noted previously, there is a filling defect in the superior mesenteric vein which is present on both the portal venous phase study (see image 105 of series 6) and on the renal delay sequence (see image 11 of series 7).  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 Main portal vein is patent although I think there is a tiny amount of non occlusive thrombus in the left portal vein (see images 95 and 96 of series 6).  Wall irregularity in the abdominal aorta is compatible with atherosclerosis and abdominal aorta measures up to 3.1 cm in diameter, mildly aneurysmal.  There is no lymphadenopathy in the abdomen.  No abdominal free fluid.  Imaging through the pelvis shows no free intraperitoneal fluid. There is no pelvic sidewall lymphadenopathy.  Bladder is unremarkable.  Diverticular disease is noted in the left colon without diverticulitis and some diverticuli are noted in the right colon is well.  The terminal ileum is normal.  The appendix is normal  Bilateral inguinal hernias contain only fat  Bone windows reveal no worrisome lytic or sclerotic osseous lesions.  Bilateral pars interarticularis defects are  noted L5 with grade 2 anterolisthesis of L5-1 S1.  IMPRESSION: Interval decrease in size of the pancreatic head mass with decreasing regional lymphadenopathy and decreasing metastatic disease within the liver.  No new or progressive neoplastic disease on today's study.  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   Original Report Authenticated By: Kennith Center, M.D.    Medications: I have reviewed the patient's current medications.  Assessment/Plan:  1. Metastatic pancreas cancer. The clinical presentation, elevated CA 19-9 and abdominal CT consistent with a diagnosis of metastatic pancreas cancer. 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 Initiation of gemcitabine/Abraxane on 07/21/2012. Last treated on 08/04/2012.  The CA  19-9 was improved on 09/01/2012 following 3 treatments with gemcitabine/Abraxane.  Restaging CT evaluation 09/29/2012 showed interval decrease in the left lower lobe pulmonary nodule; interval decrease in the size of the pancreatic head mass with decreasing regional lymphadenopathy and decreasing metastatic disease within the liver. No new or progressive disease noted. CA 19-9 40775 on 07/07/2012, 1631 on 09/01/2012 and 2105 on 09/29/2012. 2. Pain secondary to #1. Resolved. 3. Acute myocardial infarction 06/16/2012 status post PTCA/drug-eluting stent, maintained on Brilinta. 4. Status post Port-A-Cath placement 07/16/2012. 5. Admission with high fever and chills on 08/11/2012. CT of the chest revealed multilobar infiltrates. He was diagnosed with chemotherapy induced pneumonitis. He improved with steroids. Chest x-ray 09/15/2012 showed continued improvement.  Disposition-Mr. Magnone appears stable. The recent restaging CT evaluation showed improvement. Dr. Truett Perna  reviewed the CT scan images with Mr. Baston on the computer.  Dr. Truett Perna discussed treatment options to include observation, single agent Abraxane, FOLFOX. Mr. Mullane would like to resume systemic therapy. Dr. Truett Perna recommends changing treatment to the FOLFOX regimen.  We discussed potential toxicities including nausea, mouth sores, diarrhea, myelosuppression. We discussed the neurotoxicity associated with oxaliplatin including cold sensitivity, peripheral neuropathy, acute laryngopharyngeal dysesthesia and more rare occurrences such as diplopia, ataxia. We discussed skin hyperpigmentation and hand-foot syndrome associated with 5-fluorouracil. He understands there is potential for an allergic reaction with either medication.  He will return to begin cycle 1 on 10/12/2012. We will see him in followup on 10/26/2012. He will contact the office prior to that visit with any problems.  Patient seen with Dr. Truett Perna.  Lonna Cobb ANP/GNP-BC    There has been clinical and x-ray in improvement following gemcitabine/Abraxane therapy. However he developed pneumonitis after several treatments with gemcitabine/Abraxane. I am reluctant to treat him with this regimen again. We discussed treatment options including observation and FOLFOX. He is not comfortable with observation and would like to proceed with FOLFOX chemotherapy. We will consider adding irinotecan to the FOLFOX regimen depending on tolerance. We reviewed the specifics of the FOLFOX regimen including the various types of neuropathy seen with oxaliplatin. The plan is to begin FOLFOX within the next one week.  Mancel Bale, M.D.

## 2012-10-07 NOTE — Telephone Encounter (Signed)
gv and printed appt sched and avs for pt...emailed MB to add tx.   °

## 2012-10-08 ENCOUNTER — Encounter (HOSPITAL_COMMUNITY)
Admission: RE | Admit: 2012-10-08 | Discharge: 2012-10-08 | Disposition: A | Discharge status: Home / Self Care | Payer: Medicare Other | Source: Ambulatory Visit | Attending: Cardiology | Admitting: Cardiology

## 2012-10-10 ENCOUNTER — Other Ambulatory Visit: Payer: Self-pay | Admitting: Oncology

## 2012-10-11 ENCOUNTER — Encounter (HOSPITAL_COMMUNITY)
Admission: RE | Admit: 2012-10-11 | Discharge: 2012-10-11 | Disposition: A | Discharge status: Home / Self Care | Payer: Medicare Other | Source: Ambulatory Visit | Attending: Cardiology | Admitting: Cardiology

## 2012-10-12 ENCOUNTER — Ambulatory Visit (HOSPITAL_BASED_OUTPATIENT_CLINIC_OR_DEPARTMENT_OTHER): Payer: Medicare Other

## 2012-10-12 ENCOUNTER — Telehealth: Payer: Self-pay | Admitting: *Deleted

## 2012-10-12 ENCOUNTER — Other Ambulatory Visit (HOSPITAL_BASED_OUTPATIENT_CLINIC_OR_DEPARTMENT_OTHER): Payer: Medicare Other | Admitting: Lab

## 2012-10-12 VITALS — BP 97/57 | HR 51 | Temp 97.2°F | Resp 20

## 2012-10-12 DIAGNOSIS — C259 Malignant neoplasm of pancreas, unspecified: Secondary | ICD-10-CM

## 2012-10-12 DIAGNOSIS — Z5111 Encounter for antineoplastic chemotherapy: Secondary | ICD-10-CM

## 2012-10-12 LAB — CBC WITH DIFFERENTIAL/PLATELET
Basophils Absolute: 0 10*3/uL (ref 0.0–0.1)
EOS%: 4.7 % (ref 0.0–7.0)
HCT: 34.3 % — ABNORMAL LOW (ref 38.4–49.9)
HGB: 11.4 g/dL — ABNORMAL LOW (ref 13.0–17.1)
MCH: 30 pg (ref 27.2–33.4)
NEUT%: 68.8 % (ref 39.0–75.0)
lymph#: 1 10*3/uL (ref 0.9–3.3)

## 2012-10-12 LAB — COMPREHENSIVE METABOLIC PANEL (CC13)
BUN: 15.7 mg/dL (ref 7.0–26.0)
CO2: 24 mEq/L (ref 22–29)
Calcium: 9.3 mg/dL (ref 8.4–10.4)
Chloride: 108 mEq/L (ref 98–109)
Creatinine: 0.9 mg/dL (ref 0.7–1.3)
Glucose: 103 mg/dl (ref 70–140)

## 2012-10-12 MED ORDER — HEPARIN SOD (PORK) LOCK FLUSH 100 UNIT/ML IV SOLN
500.0000 [IU] | Freq: Once | INTRAVENOUS | Status: DC | PRN
  Filled 2012-10-12: qty 5

## 2012-10-12 MED ORDER — DEXTROSE 5 % IV SOLN
Freq: Once | INTRAVENOUS | Status: AC
  Administered 2012-10-12: 11:00:00 via INTRAVENOUS

## 2012-10-12 MED ORDER — SODIUM CHLORIDE 0.9 % IJ SOLN
10.0000 mL | INTRAMUSCULAR | Status: DC | PRN
  Filled 2012-10-12: qty 10

## 2012-10-12 MED ORDER — OXALIPLATIN CHEMO INJECTION 100 MG/20ML
85.0000 mg/m2 | Freq: Once | INTRAVENOUS | Status: AC
  Administered 2012-10-12: 155 mg via INTRAVENOUS
  Filled 2012-10-12: qty 31

## 2012-10-12 MED ORDER — FLUOROURACIL CHEMO INJECTION 2.5 GM/50ML
400.0000 mg/m2 | Freq: Once | INTRAVENOUS | Status: AC
  Administered 2012-10-12: 750 mg via INTRAVENOUS
  Filled 2012-10-12: qty 15

## 2012-10-12 MED ORDER — ONDANSETRON 8 MG/50ML IVPB (CHCC)
8.0000 mg | Freq: Once | INTRAVENOUS | Status: AC
  Administered 2012-10-12: 8 mg via INTRAVENOUS

## 2012-10-12 MED ORDER — DEXAMETHASONE SODIUM PHOSPHATE 10 MG/ML IJ SOLN
10.0000 mg | Freq: Once | INTRAMUSCULAR | Status: AC
  Administered 2012-10-12: 10 mg via INTRAVENOUS

## 2012-10-12 MED ORDER — LEUCOVORIN CALCIUM INJECTION 350 MG
400.0000 mg/m2 | Freq: Once | INTRAVENOUS | Status: AC
  Administered 2012-10-12: 740 mg via INTRAVENOUS
  Filled 2012-10-12: qty 37

## 2012-10-12 MED ORDER — SODIUM CHLORIDE 0.9 % IV SOLN
2400.0000 mg/m2 | INTRAVENOUS | Status: DC
  Administered 2012-10-12: 4450 mg via INTRAVENOUS
  Filled 2012-10-12: qty 89

## 2012-10-12 NOTE — Patient Instructions (Addendum)

## 2012-10-12 NOTE — Telephone Encounter (Signed)
Per staff phone call and POF I have schedueld appts.  JMW  

## 2012-10-13 ENCOUNTER — Other Ambulatory Visit: Payer: Self-pay | Admitting: Oncology

## 2012-10-13 ENCOUNTER — Encounter: Payer: Self-pay | Admitting: Oncology

## 2012-10-14 ENCOUNTER — Ambulatory Visit (HOSPITAL_BASED_OUTPATIENT_CLINIC_OR_DEPARTMENT_OTHER): Payer: Medicare Other

## 2012-10-14 VITALS — BP 104/59 | HR 60 | Temp 97.7°F

## 2012-10-14 DIAGNOSIS — C259 Malignant neoplasm of pancreas, unspecified: Secondary | ICD-10-CM

## 2012-10-14 MED ORDER — HEPARIN SOD (PORK) LOCK FLUSH 100 UNIT/ML IV SOLN
500.0000 [IU] | Freq: Once | INTRAVENOUS | Status: AC | PRN
  Administered 2012-10-14: 500 [IU]
  Filled 2012-10-14: qty 5

## 2012-10-14 MED ORDER — SODIUM CHLORIDE 0.9 % IJ SOLN
10.0000 mL | INTRAMUSCULAR | Status: DC | PRN
Start: 2012-10-14 — End: 2012-10-14
  Administered 2012-10-14: 10 mL
  Filled 2012-10-14: qty 10

## 2012-10-15 ENCOUNTER — Encounter (HOSPITAL_COMMUNITY)
Admission: RE | Admit: 2012-10-15 | Discharge: 2012-10-15 | Disposition: A | Discharge status: Home / Self Care | Payer: Medicare Other | Source: Ambulatory Visit | Attending: Cardiology | Admitting: Cardiology

## 2012-10-16 ENCOUNTER — Encounter: Payer: Self-pay | Admitting: Pulmonary Disease

## 2012-10-18 ENCOUNTER — Encounter (HOSPITAL_COMMUNITY)
Admission: RE | Admit: 2012-10-18 | Discharge: 2012-10-18 | Disposition: A | Discharge status: Home / Self Care | Payer: Medicare Other | Source: Ambulatory Visit | Attending: Cardiology | Admitting: Cardiology

## 2012-10-18 ENCOUNTER — Telehealth: Payer: Self-pay | Admitting: *Deleted

## 2012-10-18 NOTE — Telephone Encounter (Signed)
Call from pt asking about side effects from FolFox, reports he has had no cold sensitivity, asks if it is OK to drink cold liquids? Reinforced teaching re: cold sensitivity. Recommended he introduce cold liquids slowly. He voiced understanding. Pt also asks if it is OK to have an alcoholic beverage? YES. Avoid alcohol on days of treatment. He voiced understanding. Next appts confirmed.

## 2012-10-18 NOTE — Telephone Encounter (Signed)
Per 7.29.14 ov w/ VS: Patient Instructions    Combivent 1 puff up to four times per day as needed for cough, wheeze, shortness of breath, or chest tightness >> try using this before exercising for the next few days  Call to update status in 2 weeks  Follow up in 2 months   Reply email sent to patient asking if he took the Combivent Respimat as directed and if he is experiencing any symptoms VS is off until 8.21.14

## 2012-10-22 ENCOUNTER — Encounter (HOSPITAL_COMMUNITY)
Admission: RE | Admit: 2012-10-22 | Discharge: 2012-10-22 | Disposition: A | Discharge status: Home / Self Care | Payer: Medicare Other | Source: Ambulatory Visit | Attending: Cardiology | Admitting: Cardiology

## 2012-10-24 ENCOUNTER — Other Ambulatory Visit: Payer: Self-pay | Admitting: Oncology

## 2012-10-25 ENCOUNTER — Encounter (HOSPITAL_COMMUNITY)
Admission: RE | Admit: 2012-10-25 | Discharge: 2012-10-25 | Disposition: A | Discharge status: Home / Self Care | Payer: Medicare Other | Source: Ambulatory Visit | Attending: Cardiology | Admitting: Cardiology

## 2012-10-26 ENCOUNTER — Ambulatory Visit (HOSPITAL_BASED_OUTPATIENT_CLINIC_OR_DEPARTMENT_OTHER): Payer: Medicare Other

## 2012-10-26 ENCOUNTER — Ambulatory Visit (HOSPITAL_BASED_OUTPATIENT_CLINIC_OR_DEPARTMENT_OTHER): Payer: Medicare Other | Admitting: Oncology

## 2012-10-26 ENCOUNTER — Telehealth: Payer: Self-pay | Admitting: *Deleted

## 2012-10-26 ENCOUNTER — Other Ambulatory Visit: Payer: Medicare Other | Admitting: Lab

## 2012-10-26 ENCOUNTER — Telehealth: Payer: Self-pay

## 2012-10-26 VITALS — BP 102/59 | HR 55 | Temp 97.1°F | Resp 18 | Ht 67.0 in | Wt 161.8 lb

## 2012-10-26 DIAGNOSIS — G893 Neoplasm related pain (acute) (chronic): Secondary | ICD-10-CM

## 2012-10-26 DIAGNOSIS — I219 Acute myocardial infarction, unspecified: Secondary | ICD-10-CM

## 2012-10-26 DIAGNOSIS — C787 Secondary malignant neoplasm of liver and intrahepatic bile duct: Secondary | ICD-10-CM

## 2012-10-26 DIAGNOSIS — C259 Malignant neoplasm of pancreas, unspecified: Secondary | ICD-10-CM

## 2012-10-26 DIAGNOSIS — Z5111 Encounter for antineoplastic chemotherapy: Secondary | ICD-10-CM

## 2012-10-26 LAB — COMPREHENSIVE METABOLIC PANEL (CC13)
ALT: 27 U/L (ref 0–55)
AST: 24 U/L (ref 5–34)
Alkaline Phosphatase: 102 U/L (ref 40–150)
BUN: 13.5 mg/dL (ref 7.0–26.0)
Creatinine: 0.8 mg/dL (ref 0.7–1.3)
Total Bilirubin: 0.58 mg/dL (ref 0.20–1.20)

## 2012-10-26 LAB — CBC WITH DIFFERENTIAL/PLATELET
Basophils Absolute: 0 10*3/uL (ref 0.0–0.1)
HCT: 32.9 % — ABNORMAL LOW (ref 38.4–49.9)
HGB: 11.1 g/dL — ABNORMAL LOW (ref 13.0–17.1)
MONO#: 0.6 10*3/uL (ref 0.1–0.9)
NEUT#: 4.2 10*3/uL (ref 1.5–6.5)
NEUT%: 68.8 % (ref 39.0–75.0)
WBC: 6.1 10*3/uL (ref 4.0–10.3)
lymph#: 0.9 10*3/uL (ref 0.9–3.3)

## 2012-10-26 MED ORDER — DEXTROSE 5 % IV SOLN
Freq: Once | INTRAVENOUS | Status: AC
  Administered 2012-10-26: 10:00:00 via INTRAVENOUS

## 2012-10-26 MED ORDER — OXALIPLATIN CHEMO INJECTION 100 MG/20ML
85.0000 mg/m2 | Freq: Once | INTRAVENOUS | Status: AC
  Administered 2012-10-26: 155 mg via INTRAVENOUS
  Filled 2012-10-26: qty 31

## 2012-10-26 MED ORDER — DEXAMETHASONE SODIUM PHOSPHATE 10 MG/ML IJ SOLN
10.0000 mg | Freq: Once | INTRAMUSCULAR | Status: AC
  Administered 2012-10-26: 10 mg via INTRAVENOUS

## 2012-10-26 MED ORDER — SODIUM CHLORIDE 0.9 % IV SOLN
2400.0000 mg/m2 | INTRAVENOUS | Status: DC
  Administered 2012-10-26: 4450 mg via INTRAVENOUS
  Filled 2012-10-26: qty 89

## 2012-10-26 MED ORDER — FLUOROURACIL CHEMO INJECTION 2.5 GM/50ML
400.0000 mg/m2 | Freq: Once | INTRAVENOUS | Status: AC
  Administered 2012-10-26: 750 mg via INTRAVENOUS
  Filled 2012-10-26: qty 15

## 2012-10-26 MED ORDER — LEUCOVORIN CALCIUM INJECTION 350 MG
400.0000 mg/m2 | Freq: Once | INTRAMUSCULAR | Status: AC
  Administered 2012-10-26: 740 mg via INTRAVENOUS
  Filled 2012-10-26: qty 37

## 2012-10-26 MED ORDER — ONDANSETRON 8 MG/50ML IVPB (CHCC)
8.0000 mg | Freq: Once | INTRAVENOUS | Status: AC
  Administered 2012-10-26: 8 mg via INTRAVENOUS

## 2012-10-26 NOTE — Patient Instructions (Addendum)
[facility] Discharge Instructions for Patients Receiving Chemotherapy  Today you received the following chemotherapy agents Oxaliplatin/Leucovorin/5FU  To help prevent nausea and vomiting after your treatment, we encourage you to take your nausea medication as prescribed.   If you develop nausea and vomiting that is not controlled by your nausea medication, call the clinic.   BELOW ARE SYMPTOMS THAT SHOULD BE REPORTED IMMEDIATELY:  *FEVER GREATER THAN 100.5 F  *CHILLS WITH OR WITHOUT FEVER  NAUSEA AND VOMITING THAT IS NOT CONTROLLED WITH YOUR NAUSEA MEDICATION  *UNUSUAL SHORTNESS OF BREATH  *UNUSUAL BRUISING OR BLEEDING  TENDERNESS IN MOUTH AND THROAT WITH OR WITHOUT PRESENCE OF ULCERS  *URINARY PROBLEMS  *BOWEL PROBLEMS  UNUSUAL RASH Items with * indicate a potential emergency and should be followed up as soon as possible.  Feel free to call the clinic you have any questions or concerns. The clinic phone number is [phone].    

## 2012-10-26 NOTE — Telephone Encounter (Signed)
Per staff message and POF I have scheduled appts.  JMW  

## 2012-10-26 NOTE — Telephone Encounter (Signed)
gv adn printed appt sched and avs for pt...MW added tx.Marland KitchenMarland KitchenMarland Kitchen

## 2012-10-26 NOTE — Progress Notes (Signed)
More you   [facility]    OFFICE PROGRESS NOTE   INTERVAL HISTORY:   She returns as scheduled. He the first cycle of FOLFOX on 10/12/2012. No nausea/vomiting, cold sensitivity, or neuropathy symptoms. No abdominal pain. He feels well. A few episodes of diarrhea. No chest pain or dyspnea. Good appetite. He continues a cardiac rehabilitation program.  Objective:  Vital signs in last 24 hours:  Blood pressure 102/59, pulse 55, temperature 97.1 F (36.2 C), temperature source Oral, resp. rate 18, height 5\' 7"  (1.702 m), weight 161 lb 12.8 oz (73.392 kg).    HEENT: No thrush or ulcers Resp: Lungs clear bilaterally Cardio: Regular rate and rhythm GI: No hepatomegaly, no mass, no apparent ascites Vascular: No leg edema  Port-A-Cath without erythema Lab Results:  Lab Results  Component Value Date   WBC 6.1 10/26/2012   HGB 11.1* 10/26/2012   HCT 32.9* 10/26/2012   MCV 90.7 10/26/2012   PLT 147 10/26/2012   ANC 4.2    Medications: I have reviewed the patient's current medications.  Assessment/Plan: 1. Metastatic pancreas cancer. The clinical presentation, elevated CA 19-9 and abdominal CT consistent with a diagnosis of metastatic pancreas cancer. 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 Initiation of gemcitabine/Abraxane on 07/21/2012. Last treated on 08/04/2012.  The CA 19-9 was improved on 09/01/2012 following 3 treatments with gemcitabine/Abraxane.  Restaging CT evaluation 09/29/2012 showed interval decrease in the left lower lobe pulmonary nodule; interval decrease in the size of the pancreatic head mass with decreasing regional lymphadenopathy and decreasing metastatic disease within the liver. No new or progressive disease noted.  CA 19-9 40775 on 07/07/2012, 1631 on 09/01/2012 and 2105 on 09/29/2012. Cycle 1 FOLFOX 10/12/2012 2. Pain secondary to #1.  Resolved. 3. Acute myocardial infarction 06/16/2012 status post PTCA/drug-eluting stent, maintained on Brilinta. 4. Status post Port-A-Cath placement 07/16/2012. 5. Admission with high fever and chills on 08/11/2012. CT of the chest revealed multilobar infiltrates. He was diagnosed with chemotherapy induced pneumonitis. He improved with steroids. Chest x-ray 09/15/2012 showed continued improvement.   Disposition:  He tolerated the first cycle of FOLFOX without significant acute toxicity. He appears well today. The plan is to proceed with cycle 2 today.  Mr. Eriksson plans to take a vacation between 11/09/2012 and 11/20/2012. He will return for an office visit and the next cycle of chemotherapy on 11/23/2012. He knows to contact us for a fever or bleeding symptoms.   Thornton Papas, MD  10/26/2012  9:45 AM   And

## 2012-10-28 ENCOUNTER — Ambulatory Visit (HOSPITAL_BASED_OUTPATIENT_CLINIC_OR_DEPARTMENT_OTHER): Payer: Medicare Other

## 2012-10-28 VITALS — BP 105/60 | HR 57 | Temp 97.8°F | Resp 18

## 2012-10-28 DIAGNOSIS — Z452 Encounter for adjustment and management of vascular access device: Secondary | ICD-10-CM

## 2012-10-28 DIAGNOSIS — C259 Malignant neoplasm of pancreas, unspecified: Secondary | ICD-10-CM

## 2012-10-28 MED ORDER — SODIUM CHLORIDE 0.9 % IJ SOLN
10.0000 mL | INTRAMUSCULAR | Status: DC | PRN
  Administered 2012-10-28: 10 mL
  Filled 2012-10-28: qty 10

## 2012-10-28 MED ORDER — HEPARIN SOD (PORK) LOCK FLUSH 100 UNIT/ML IV SOLN
500.0000 [IU] | Freq: Once | INTRAVENOUS | Status: AC | PRN
  Administered 2012-10-28: 500 [IU]
  Filled 2012-10-28: qty 5

## 2012-10-28 NOTE — Patient Instructions (Addendum)

## 2012-10-29 ENCOUNTER — Encounter (HOSPITAL_COMMUNITY)
Admission: RE | Admit: 2012-10-29 | Discharge: 2012-10-29 | Disposition: A | Discharge status: Home / Self Care | Payer: Medicare Other | Source: Ambulatory Visit | Attending: Cardiology | Admitting: Cardiology

## 2012-11-01 ENCOUNTER — Encounter: Payer: Self-pay | Admitting: Oncology

## 2012-11-01 ENCOUNTER — Encounter (HOSPITAL_COMMUNITY): Payer: Medicare Other

## 2012-11-01 DIAGNOSIS — Z8249 Family history of ischemic heart disease and other diseases of the circulatory system: Secondary | ICD-10-CM | POA: Insufficient documentation

## 2012-11-01 DIAGNOSIS — I2510 Atherosclerotic heart disease of native coronary artery without angina pectoris: Secondary | ICD-10-CM | POA: Insufficient documentation

## 2012-11-01 DIAGNOSIS — Z5189 Encounter for other specified aftercare: Secondary | ICD-10-CM | POA: Insufficient documentation

## 2012-11-01 DIAGNOSIS — I252 Old myocardial infarction: Secondary | ICD-10-CM | POA: Insufficient documentation

## 2012-11-03 ENCOUNTER — Other Ambulatory Visit: Payer: Self-pay | Admitting: *Deleted

## 2012-11-03 DIAGNOSIS — C259 Malignant neoplasm of pancreas, unspecified: Secondary | ICD-10-CM

## 2012-11-03 MED ORDER — MAGIC MOUTHWASH W/LIDOCAINE: ORAL | Status: DC

## 2012-11-03 NOTE — Telephone Encounter (Signed)
Read 11-01-2012 MyChart message re: mouth sores.  Message left requesting a return call for further assessment.  No return call.  Obtained order for MMW as requested by patient.  MyChart message will be sent.

## 2012-11-05 ENCOUNTER — Encounter (HOSPITAL_COMMUNITY)
Admission: RE | Admit: 2012-11-05 | Discharge: 2012-11-05 | Disposition: A | Discharge status: Home / Self Care | Payer: Medicare Other | Source: Ambulatory Visit | Attending: Cardiology | Admitting: Cardiology

## 2012-11-08 ENCOUNTER — Encounter (HOSPITAL_COMMUNITY)
Admission: RE | Admit: 2012-11-08 | Discharge: 2012-11-08 | Disposition: A | Discharge status: Home / Self Care | Payer: Medicare Other | Source: Ambulatory Visit | Attending: Cardiology | Admitting: Cardiology

## 2012-11-12 ENCOUNTER — Encounter (HOSPITAL_COMMUNITY): Payer: Medicare Other

## 2012-11-15 ENCOUNTER — Encounter (HOSPITAL_COMMUNITY): Payer: Medicare Other

## 2012-11-19 ENCOUNTER — Encounter (HOSPITAL_COMMUNITY): Payer: Medicare Other

## 2012-11-21 ENCOUNTER — Other Ambulatory Visit: Payer: Self-pay | Admitting: Oncology

## 2012-11-22 ENCOUNTER — Encounter (HOSPITAL_COMMUNITY)
Admission: RE | Admit: 2012-11-22 | Discharge: 2012-11-22 | Disposition: A | Discharge status: Home / Self Care | Payer: Medicare Other | Source: Ambulatory Visit | Attending: Cardiology | Admitting: Cardiology

## 2012-11-23 ENCOUNTER — Ambulatory Visit (HOSPITAL_BASED_OUTPATIENT_CLINIC_OR_DEPARTMENT_OTHER): Payer: Medicare Other

## 2012-11-23 ENCOUNTER — Ambulatory Visit (HOSPITAL_BASED_OUTPATIENT_CLINIC_OR_DEPARTMENT_OTHER): Payer: Medicare Other | Admitting: Physician Assistant

## 2012-11-23 ENCOUNTER — Telehealth: Payer: Self-pay | Admitting: Oncology

## 2012-11-23 ENCOUNTER — Telehealth: Payer: Self-pay | Admitting: *Deleted

## 2012-11-23 ENCOUNTER — Other Ambulatory Visit (HOSPITAL_BASED_OUTPATIENT_CLINIC_OR_DEPARTMENT_OTHER): Payer: Medicare Other | Admitting: Lab

## 2012-11-23 VITALS — BP 104/64 | HR 56 | Temp 97.3°F | Resp 18 | Ht 67.0 in | Wt 161.1 lb

## 2012-11-23 DIAGNOSIS — C259 Malignant neoplasm of pancreas, unspecified: Secondary | ICD-10-CM

## 2012-11-23 DIAGNOSIS — Z5111 Encounter for antineoplastic chemotherapy: Secondary | ICD-10-CM

## 2012-11-23 LAB — CBC WITH DIFFERENTIAL/PLATELET
BASO%: 0.6 % (ref 0.0–2.0)
EOS%: 4.7 % (ref 0.0–7.0)
HCT: 33.3 % — ABNORMAL LOW (ref 38.4–49.9)
LYMPH%: 16.5 % (ref 14.0–49.0)
MCH: 32 pg (ref 27.2–33.4)
MCHC: 34.2 g/dL (ref 32.0–36.0)
NEUT%: 65.4 % (ref 39.0–75.0)
RBC: 3.55 10*6/uL — ABNORMAL LOW (ref 4.20–5.82)
WBC: 5.5 10*3/uL (ref 4.0–10.3)
lymph#: 0.9 10*3/uL (ref 0.9–3.3)

## 2012-11-23 LAB — COMPREHENSIVE METABOLIC PANEL (CC13)
ALT: 27 U/L (ref 0–55)
AST: 27 U/L (ref 5–34)
Chloride: 108 mEq/L (ref 98–109)
Creatinine: 0.9 mg/dL (ref 0.7–1.3)
Sodium: 140 mEq/L (ref 136–145)
Total Bilirubin: 0.72 mg/dL (ref 0.20–1.20)
Total Protein: 6.9 g/dL (ref 6.4–8.3)

## 2012-11-23 LAB — CANCER ANTIGEN 19-9: CA 19-9: 5764.5 U/mL — ABNORMAL HIGH (ref <35.0)

## 2012-11-23 MED ORDER — ONDANSETRON 8 MG/NS 50 ML IVPB
INTRAVENOUS | Status: AC
  Filled 2012-11-23: qty 8

## 2012-11-23 MED ORDER — DEXAMETHASONE SODIUM PHOSPHATE 10 MG/ML IJ SOLN
INTRAMUSCULAR | Status: AC
  Filled 2012-11-23: qty 1

## 2012-11-23 MED ORDER — DEXTROSE 5 % IV SOLN
Freq: Once | INTRAVENOUS | Status: AC
  Administered 2012-11-23: 11:00:00 via INTRAVENOUS

## 2012-11-23 MED ORDER — ONDANSETRON 8 MG/50ML IVPB (CHCC)
8.0000 mg | Freq: Once | INTRAVENOUS | Status: AC
  Administered 2012-11-23: 8 mg via INTRAVENOUS

## 2012-11-23 MED ORDER — SODIUM CHLORIDE 0.9 % IV SOLN
2400.0000 mg/m2 | INTRAVENOUS | Status: DC
  Administered 2012-11-23: 4450 mg via INTRAVENOUS
  Filled 2012-11-23: qty 89

## 2012-11-23 MED ORDER — FLUOROURACIL CHEMO INJECTION 2.5 GM/50ML
400.0000 mg/m2 | Freq: Once | INTRAVENOUS | Status: AC
  Administered 2012-11-23: 750 mg via INTRAVENOUS
  Filled 2012-11-23: qty 15

## 2012-11-23 MED ORDER — OXALIPLATIN CHEMO INJECTION 100 MG/20ML
85.0000 mg/m2 | Freq: Once | INTRAVENOUS | Status: AC
  Administered 2012-11-23: 155 mg via INTRAVENOUS
  Filled 2012-11-23: qty 31

## 2012-11-23 MED ORDER — DEXAMETHASONE SODIUM PHOSPHATE 10 MG/ML IJ SOLN
10.0000 mg | Freq: Once | INTRAMUSCULAR | Status: AC
  Administered 2012-11-23: 10 mg via INTRAVENOUS

## 2012-11-23 MED ORDER — LEUCOVORIN CALCIUM INJECTION 350 MG
400.0000 mg/m2 | Freq: Once | INTRAVENOUS | Status: AC
  Administered 2012-11-23: 740 mg via INTRAVENOUS
  Filled 2012-11-23: qty 37

## 2012-11-23 NOTE — Telephone Encounter (Signed)
gv and printed appt sched and avs forpt for SEpt and OCT...emailed MW to add tx °

## 2012-11-23 NOTE — Patient Instructions (Signed)
[facility] Discharge Instructions for Patients Receiving Chemotherapy  Today you received the following chemotherapy agents 5 FU/ Leucovorin/Oxaliplatin  To help prevent nausea and vomiting after your treatment, we encourage you to take your nausea medication as prescribed.  If you develop nausea and vomiting that is not controlled by your nausea medication, call the clinic.   BELOW ARE SYMPTOMS THAT SHOULD BE REPORTED IMMEDIATELY:  *FEVER GREATER THAN 100.5 F  *CHILLS WITH OR WITHOUT FEVER  NAUSEA AND VOMITING THAT IS NOT CONTROLLED WITH YOUR NAUSEA MEDICATION  *UNUSUAL SHORTNESS OF BREATH  *UNUSUAL BRUISING OR BLEEDING  TENDERNESS IN MOUTH AND THROAT WITH OR WITHOUT PRESENCE OF ULCERS  *URINARY PROBLEMS  *BOWEL PROBLEMS  UNUSUAL RASH Items with * indicate a potential emergency and should be followed up as soon as possible.  Feel free to call the clinic you have any questions or concerns. The clinic phone number is [phone].     

## 2012-11-23 NOTE — Telephone Encounter (Signed)
Per staff message and POF I have scheduled appts.  JMW  

## 2012-11-24 ENCOUNTER — Encounter: Payer: Self-pay | Admitting: Oncology

## 2012-11-25 ENCOUNTER — Ambulatory Visit (HOSPITAL_BASED_OUTPATIENT_CLINIC_OR_DEPARTMENT_OTHER): Payer: Medicare Other

## 2012-11-25 VITALS — BP 116/56 | HR 53 | Temp 97.6°F | Resp 18

## 2012-11-25 DIAGNOSIS — C259 Malignant neoplasm of pancreas, unspecified: Secondary | ICD-10-CM

## 2012-11-25 MED ORDER — HEPARIN SOD (PORK) LOCK FLUSH 100 UNIT/ML IV SOLN
500.0000 [IU] | Freq: Once | INTRAVENOUS | Status: AC | PRN
  Administered 2012-11-25: 500 [IU]
  Filled 2012-11-25: qty 5

## 2012-11-25 MED ORDER — SODIUM CHLORIDE 0.9 % IJ SOLN
10.0000 mL | INTRAMUSCULAR | Status: DC | PRN
  Administered 2012-11-25: 10 mL
  Filled 2012-11-25: qty 10

## 2012-11-25 NOTE — Patient Instructions (Signed)
Follow-up in 2 weeks prior to your next scheduled cycle of chemotherapy 

## 2012-11-25 NOTE — Progress Notes (Signed)
More you   [facility]    OFFICE PROGRESS NOTE   INTERVAL HISTORY:   He returns as scheduled. As post 2 cycles of FOLFOX ( given 10/26/2012). He reports that he enjoyed his vacation and did some hiking in the [facility] as well as down to [facility]. He did not experience any chest pain although he did have some shortness of breath but he took his time on these hikes and rested as needed. He voices no specific complaints today he presents to proceed with cycle #3 of his systemic chemotherapy with FOLFOX. Denied nausea/vomiting, cold sensitivity, or neuropathy symptoms. No abdominal pain. He feels well. A few episodes of diarrhea. No chest pain or dyspnea. Good appetite. He continues a cardiac rehabilitation program.  Objective:  Vital signs in last 24 hours:  Blood pressure 104/64, pulse 56, temperature 97.3 F (36.3 C), temperature source Oral, resp. rate 18, height 5\' 7"  (1.702 m), weight 161 lb 1.6 oz (73.074 kg), SpO2 100.00%.    HEENT: No thrush or ulcers Resp: Lungs clear bilaterally Cardio: Regular rate and rhythm GI: No hepatomegaly, no mass, no apparent ascites Vascular: No leg edema  Port-A-Cath without erythema Lab Results:  Lab Results  Component Value Date   WBC 5.5 11/23/2012   HGB 11.4* 11/23/2012   HCT 33.3* 11/23/2012   MCV 93.6 11/23/2012   PLT 134* 11/23/2012   ANC 3.6    Medications: I have reviewed the patient's current medications.  Assessment/Plan: 1. Metastatic pancreas cancer. The clinical presentation, elevated CA 19-9 and abdominal CT consistent with a diagnosis of metastatic pancreas cancer. 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 Initiation of gemcitabine/Abraxane on 07/21/2012. Last treated on 08/04/2012.  The CA 19-9 was improved on 09/01/2012 following 3 treatments with gemcitabine/Abraxane.  Restaging CT evaluation 09/29/2012  showed interval decrease in the left lower lobe pulmonary nodule; interval decrease in the size of the pancreatic head mass with decreasing regional lymphadenopathy and decreasing metastatic disease within the liver. No new or progressive disease noted.  CA 19-9 40775 on 07/07/2012, 1631 on 09/01/2012 and 2105 on 09/29/2012, ~2292 on 10/26/2012, ~ 5765 on 11/23/2012. Cycle 1 FOLFOX 10/12/2012, cycle 2 10/26/2012 2. Pain secondary to #1. Resolved. 3. Acute myocardial infarction 06/16/2012 status post PTCA/drug-eluting stent, maintained on Brilinta. 4. Status post Port-A-Cath placement 07/16/2012. 5. Admission with high fever and chills on 08/11/2012. CT of the chest revealed multilobar infiltrates. He was diagnosed with chemotherapy induced pneumonitis. He improved with steroids. Chest x-ray 09/15/2012 showed continued improvement.   Disposition:  He tolerated the first  2 cycles of FOLFOX without significant acute toxicity. He appears well today. The plan is to proceed with cycle 3 today. Patient was reviewed with Dr. Truett Perna. He will followup with Dr. Truett Perna in 2 weeks prior to cycle #4 with a repeat CBC differential, C. met and CA 19.9. These CA 19-9 is elevated today and likely reflects the fact that he has not had chemotherapy in the past approximately 4 weeks due to his vacation.Marland Kitchen   He knows to contact us for a fever or bleeding symptoms.   Laural Benes, Tyquavious Gamel E, PA-C

## 2012-11-26 ENCOUNTER — Encounter (HOSPITAL_COMMUNITY)
Admission: RE | Admit: 2012-11-26 | Discharge: 2012-11-26 | Disposition: A | Discharge status: Home / Self Care | Payer: Medicare Other | Source: Ambulatory Visit | Attending: Cardiology | Admitting: Cardiology

## 2012-11-26 NOTE — Progress Notes (Signed)
Pt completed his participation in the cardiac rehab phase II program.  Pt completed 26 exercise sessions in a 18 week period.  Pt should be commended for his efforts and growth achieved in cardiac rehab.  Repeat PHQ2 score - 1.  Medication list reconciled.  Pt will continue home exercise with walking in the neighborhood as tolerated.

## 2012-12-01 ENCOUNTER — Encounter: Payer: Self-pay | Admitting: Internal Medicine

## 2012-12-03 ENCOUNTER — Encounter: Payer: Self-pay | Admitting: Internal Medicine

## 2012-12-03 ENCOUNTER — Encounter: Payer: Self-pay | Admitting: Oncology

## 2012-12-03 ENCOUNTER — Other Ambulatory Visit: Payer: Self-pay | Admitting: *Deleted

## 2012-12-05 ENCOUNTER — Other Ambulatory Visit: Payer: Self-pay | Admitting: Oncology

## 2012-12-06 ENCOUNTER — Telehealth: Payer: Self-pay | Admitting: *Deleted

## 2012-12-06 ENCOUNTER — Other Ambulatory Visit: Payer: Self-pay | Admitting: *Deleted

## 2012-12-06 DIAGNOSIS — I252 Old myocardial infarction: Secondary | ICD-10-CM

## 2012-12-06 NOTE — Telephone Encounter (Signed)
See MyChart message

## 2012-12-07 ENCOUNTER — Ambulatory Visit (HOSPITAL_BASED_OUTPATIENT_CLINIC_OR_DEPARTMENT_OTHER): Payer: Medicare Other

## 2012-12-07 ENCOUNTER — Other Ambulatory Visit (HOSPITAL_BASED_OUTPATIENT_CLINIC_OR_DEPARTMENT_OTHER): Payer: Medicare Other | Admitting: Lab

## 2012-12-07 ENCOUNTER — Ambulatory Visit (HOSPITAL_BASED_OUTPATIENT_CLINIC_OR_DEPARTMENT_OTHER): Payer: Medicare Other | Admitting: Oncology

## 2012-12-07 ENCOUNTER — Telehealth: Payer: Self-pay | Admitting: Oncology

## 2012-12-07 VITALS — BP 131/66 | HR 64 | Temp 97.4°F | Resp 18 | Ht 67.0 in | Wt 159.7 lb

## 2012-12-07 DIAGNOSIS — C259 Malignant neoplasm of pancreas, unspecified: Secondary | ICD-10-CM

## 2012-12-07 DIAGNOSIS — I252 Old myocardial infarction: Secondary | ICD-10-CM

## 2012-12-07 DIAGNOSIS — Z5111 Encounter for antineoplastic chemotherapy: Secondary | ICD-10-CM

## 2012-12-07 DIAGNOSIS — Z23 Encounter for immunization: Secondary | ICD-10-CM

## 2012-12-07 LAB — COMPREHENSIVE METABOLIC PANEL (CC13)
ALT: 25 U/L (ref 0–55)
AST: 24 U/L (ref 5–34)
Albumin: 3.6 g/dL (ref 3.5–5.0)
Alkaline Phosphatase: 127 U/L (ref 40–150)
Anion Gap: 8 mEq/L (ref 3–11)
Calcium: 9.5 mg/dL (ref 8.4–10.4)
Chloride: 108 mEq/L (ref 98–109)
Glucose: 110 mg/dl (ref 70–140)
Potassium: 4.4 mEq/L (ref 3.5–5.1)
Sodium: 140 mEq/L (ref 136–145)
Total Protein: 7.2 g/dL (ref 6.4–8.3)

## 2012-12-07 LAB — CBC WITH DIFFERENTIAL/PLATELET
BASO%: 0.6 % (ref 0.0–2.0)
EOS%: 3.3 % (ref 0.0–7.0)
HGB: 12 g/dL — ABNORMAL LOW (ref 13.0–17.1)
LYMPH%: 17.9 % (ref 14.0–49.0)
MCH: 31.9 pg (ref 27.2–33.4)
MCHC: 33.9 g/dL (ref 32.0–36.0)
MCV: 93.9 fL (ref 79.3–98.0)
MONO%: 13.2 % (ref 0.0–14.0)
NEUT#: 3.1 10*3/uL (ref 1.5–6.5)
RBC: 3.75 10*6/uL — ABNORMAL LOW (ref 4.20–5.82)
RDW: 15.6 % — ABNORMAL HIGH (ref 11.0–14.6)
lymph#: 0.8 10*3/uL — ABNORMAL LOW (ref 0.9–3.3)

## 2012-12-07 LAB — LIPID PANEL
Cholesterol: 112 mg/dL (ref 0–200)
LDL Cholesterol: 53 mg/dL (ref 0–99)
Total CHOL/HDL Ratio: 2.5 Ratio
VLDL: 15 mg/dL (ref 0–40)

## 2012-12-07 MED ORDER — LEUCOVORIN CALCIUM INJECTION 350 MG
400.0000 mg/m2 | Freq: Once | INTRAVENOUS | Status: AC
  Administered 2012-12-07: 740 mg via INTRAVENOUS
  Filled 2012-12-07: qty 37

## 2012-12-07 MED ORDER — SODIUM CHLORIDE 0.9 % IV SOLN
2400.0000 mg/m2 | INTRAVENOUS | Status: DC
  Administered 2012-12-07: 4450 mg via INTRAVENOUS
  Filled 2012-12-07: qty 89

## 2012-12-07 MED ORDER — ONDANSETRON 8 MG/NS 50 ML IVPB
INTRAVENOUS | Status: AC
  Filled 2012-12-07: qty 8

## 2012-12-07 MED ORDER — DEXTROSE 5 % IV SOLN
Freq: Once | INTRAVENOUS | Status: AC
  Administered 2012-12-07: 11:00:00 via INTRAVENOUS

## 2012-12-07 MED ORDER — INFLUENZA VAC SPLIT QUAD 0.5 ML IM SUSP
0.5000 mL | Freq: Once | INTRAMUSCULAR | Status: AC
  Administered 2012-12-07: 0.5 mL via INTRAMUSCULAR
  Filled 2012-12-07: qty 0.5

## 2012-12-07 MED ORDER — FLUOROURACIL CHEMO INJECTION 2.5 GM/50ML
400.0000 mg/m2 | Freq: Once | INTRAVENOUS | Status: AC
  Administered 2012-12-07: 750 mg via INTRAVENOUS
  Filled 2012-12-07: qty 15

## 2012-12-07 MED ORDER — DEXAMETHASONE SODIUM PHOSPHATE 10 MG/ML IJ SOLN
10.0000 mg | Freq: Once | INTRAMUSCULAR | Status: AC
  Administered 2012-12-07: 10 mg via INTRAVENOUS

## 2012-12-07 MED ORDER — DEXAMETHASONE SODIUM PHOSPHATE 10 MG/ML IJ SOLN
INTRAMUSCULAR | Status: AC
  Filled 2012-12-07: qty 1

## 2012-12-07 MED ORDER — OXALIPLATIN CHEMO INJECTION 100 MG/20ML
85.0000 mg/m2 | Freq: Once | INTRAVENOUS | Status: AC
  Administered 2012-12-07: 155 mg via INTRAVENOUS
  Filled 2012-12-07: qty 31

## 2012-12-07 MED ORDER — ONDANSETRON 8 MG/50ML IVPB (CHCC)
8.0000 mg | Freq: Once | INTRAVENOUS | Status: AC
  Administered 2012-12-07: 8 mg via INTRAVENOUS

## 2012-12-07 NOTE — Patient Instructions (Addendum)
[facility] Discharge Instructions for Patients Receiving Chemotherapy  Today you received the following chemotherapy agents oxaliplatin, leucovorin and 34fu with a pump with 1fu.  To help prevent nausea and vomiting after your treatment, we encourage you to take your nausea medication compazine.   If you develop nausea and vomiting that is not controlled by your nausea medication, call the clinic.   BELOW ARE SYMPTOMS THAT SHOULD BE REPORTED IMMEDIATELY:  *FEVER GREATER THAN 100.5 F  *CHILLS WITH OR WITHOUT FEVER  NAUSEA AND VOMITING THAT IS NOT CONTROLLED WITH YOUR NAUSEA MEDICATION  *UNUSUAL SHORTNESS OF BREATH  *UNUSUAL BRUISING OR BLEEDING  TENDERNESS IN MOUTH AND THROAT WITH OR WITHOUT PRESENCE OF ULCERS  *URINARY PROBLEMS  *BOWEL PROBLEMS  UNUSUAL RASH Items with * indicate a potential emergency and should be followed up as soon as possible.  Feel free to call the clinic you have any questions or concerns. The clinic phone number is [phone].

## 2012-12-07 NOTE — Telephone Encounter (Signed)
gv pt appt schedule for October and November.  °

## 2012-12-07 NOTE — Progress Notes (Signed)
   [facility]    OFFICE PROGRESS NOTE   INTERVAL HISTORY:   He returns for scheduled followup pancreas cancer. He completed another cycle of FOLFOX on 11/23/2012. He noted increased fatigue following this cycle of chemotherapy. He also reported cold sensitivity with "tingling" in the fingers following chemotherapy. No neuropathy symptoms at present. No pain. No dyspnea.  Objective:  Vital signs in last 24 hours:  Blood pressure 131/66, pulse 64, temperature 97.4 F (36.3 C), temperature source Oral, resp. rate 18, height 5\' 7"  (1.702 m), weight 159 lb 11.2 oz (72.439 kg).    HEENT: No thrush or ulcers Resp: Lungs clear bilaterally Cardio: Regular rate and rhythm GI: No hepatomegaly, nontender, no mass Vascular: No leg edema Neuro: The vibratory sense is intact at the fingertips bilaterally    Portacath/PICC-without erythema  Lab Results:  Lab Results  Component Value Date   WBC 4.7 12/07/2012   HGB 12.0* 12/07/2012   HCT 35.3* 12/07/2012   MCV 93.9 12/07/2012   PLT 160 12/07/2012   ANC 3.1  CA 19-9 on 11/23/2012-5765  Medications: I have reviewed the patient's current medications.  Assessment/Plan: 1. Metastatic pancreas cancer. The clinical presentation, elevated CA 19-9 and abdominal CT consistent with a diagnosis of metastatic pancreas cancer. 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 Initiation of gemcitabine/Abraxane on 07/21/2012. Last treated on 08/04/2012.  The CA 19-9 was improved on 09/01/2012 following 3 treatments with gemcitabine/Abraxane.  Restaging CT evaluation 09/29/2012 showed interval decrease in the left lower lobe pulmonary nodule; interval decrease in the size of the pancreatic head mass with decreasing regional lymphadenopathy and decreasing metastatic disease within the liver. No new or progressive disease noted.  CA 19-9 40775 on  07/07/2012, 1631 on 09/01/2012 and 2105 on 09/29/2012, ~2292 on 10/26/2012, ~ 5765 on 11/23/2012.  Cycle 1 FOLFOX 10/12/2012, cycle 2 10/26/2012, cycle 3 11/23/2012 2. Pain secondary to #1. Resolved. 3. Acute myocardial infarction 06/16/2012 status post PTCA/drug-eluting stent, maintained on Brilinta. 4. Status post Port-A-Cath placement 07/16/2012. 5. Admission with high fever and chills on 08/11/2012. CT of the chest revealed multilobar infiltrates. He was diagnosed with chemotherapy induced pneumonitis. He improved with steroids. Chest x-ray 09/15/2012 showed continued improvement.   Disposition:  Mr. Viscomi has completed 3 cycles of FOLFOX. the CA19-9 was higher prior to cycle 3, but he had been maintained off of chemotherapy for approximately one month. He maintains a good performance status. He will complete cycle 4 today. We checked a CA 19-9 today. He will return for an office visit and chemotherapy in 2 weeks.  If the CA 19-9 continues to be higher we will consider adding irinotecan to the chemotherapy regimen or switching back to single agent Abraxane.   Thornton Papas, MD  12/07/2012  5:28 PM

## 2012-12-08 ENCOUNTER — Encounter: Payer: Self-pay | Admitting: Oncology

## 2012-12-08 ENCOUNTER — Encounter: Payer: Self-pay | Admitting: *Deleted

## 2012-12-08 LAB — CANCER ANTIGEN 19-9: CA 19-9: 5720.4 U/mL — ABNORMAL HIGH (ref <35.0)

## 2012-12-09 ENCOUNTER — Ambulatory Visit (HOSPITAL_BASED_OUTPATIENT_CLINIC_OR_DEPARTMENT_OTHER): Payer: Medicare Other

## 2012-12-09 VITALS — BP 105/66 | HR 53 | Temp 98.1°F | Resp 18

## 2012-12-09 DIAGNOSIS — Z452 Encounter for adjustment and management of vascular access device: Secondary | ICD-10-CM

## 2012-12-09 DIAGNOSIS — C259 Malignant neoplasm of pancreas, unspecified: Secondary | ICD-10-CM

## 2012-12-09 DIAGNOSIS — C787 Secondary malignant neoplasm of liver and intrahepatic bile duct: Secondary | ICD-10-CM

## 2012-12-09 MED ORDER — SODIUM CHLORIDE 0.9 % IJ SOLN
10.0000 mL | INTRAMUSCULAR | Status: DC | PRN
  Administered 2012-12-09: 10 mL
  Filled 2012-12-09: qty 10

## 2012-12-09 MED ORDER — HEPARIN SOD (PORK) LOCK FLUSH 100 UNIT/ML IV SOLN
500.0000 [IU] | Freq: Once | INTRAVENOUS | Status: AC | PRN
  Administered 2012-12-09: 500 [IU]
  Filled 2012-12-09: qty 5

## 2012-12-11 ENCOUNTER — Encounter: Payer: Self-pay | Admitting: Internal Medicine

## 2012-12-11 ENCOUNTER — Encounter: Payer: Self-pay | Admitting: Oncology

## 2012-12-13 ENCOUNTER — Encounter: Payer: Self-pay | Admitting: Oncology

## 2012-12-13 ENCOUNTER — Other Ambulatory Visit: Payer: Self-pay | Admitting: Nurse Practitioner

## 2012-12-13 ENCOUNTER — Other Ambulatory Visit: Payer: Self-pay | Admitting: *Deleted

## 2012-12-13 DIAGNOSIS — F329 Major depressive disorder, single episode, unspecified: Secondary | ICD-10-CM

## 2012-12-13 DIAGNOSIS — C259 Malignant neoplasm of pancreas, unspecified: Secondary | ICD-10-CM

## 2012-12-13 MED ORDER — CITALOPRAM HYDROBROMIDE 20 MG PO TABS: 20.0000 mg | ORAL_TABLET | Freq: Every day | ORAL | Status: AC

## 2012-12-14 MED ORDER — MAGIC MOUTHWASH W/LIDOCAINE: ORAL | Status: DC

## 2012-12-18 ENCOUNTER — Other Ambulatory Visit: Payer: Self-pay | Admitting: Oncology

## 2012-12-20 ENCOUNTER — Ambulatory Visit: Payer: Self-pay | Admitting: Pulmonary Disease

## 2012-12-21 ENCOUNTER — Ambulatory Visit (HOSPITAL_BASED_OUTPATIENT_CLINIC_OR_DEPARTMENT_OTHER): Payer: Medicare Other

## 2012-12-21 ENCOUNTER — Ambulatory Visit (HOSPITAL_BASED_OUTPATIENT_CLINIC_OR_DEPARTMENT_OTHER): Payer: Medicare Other | Admitting: Nurse Practitioner

## 2012-12-21 ENCOUNTER — Telehealth: Payer: Self-pay | Admitting: Oncology

## 2012-12-21 ENCOUNTER — Other Ambulatory Visit (HOSPITAL_BASED_OUTPATIENT_CLINIC_OR_DEPARTMENT_OTHER): Payer: Medicare Other | Admitting: Lab

## 2012-12-21 ENCOUNTER — Telehealth: Payer: Self-pay | Admitting: *Deleted

## 2012-12-21 VITALS — BP 103/55 | HR 65 | Temp 98.1°F | Resp 20 | Ht 67.0 in | Wt 160.3 lb

## 2012-12-21 DIAGNOSIS — C259 Malignant neoplasm of pancreas, unspecified: Secondary | ICD-10-CM

## 2012-12-21 DIAGNOSIS — C250 Malignant neoplasm of head of pancreas: Secondary | ICD-10-CM

## 2012-12-21 DIAGNOSIS — C787 Secondary malignant neoplasm of liver and intrahepatic bile duct: Secondary | ICD-10-CM

## 2012-12-21 DIAGNOSIS — K121 Other forms of stomatitis: Secondary | ICD-10-CM

## 2012-12-21 DIAGNOSIS — D696 Thrombocytopenia, unspecified: Secondary | ICD-10-CM

## 2012-12-21 DIAGNOSIS — R911 Solitary pulmonary nodule: Secondary | ICD-10-CM

## 2012-12-21 DIAGNOSIS — Z5111 Encounter for antineoplastic chemotherapy: Secondary | ICD-10-CM

## 2012-12-21 LAB — COMPREHENSIVE METABOLIC PANEL (CC13)
ALT: 28 U/L (ref 0–55)
AST: 24 U/L (ref 5–34)
Alkaline Phosphatase: 125 U/L (ref 40–150)
BUN: 17 mg/dL (ref 7.0–26.0)
Calcium: 9.1 mg/dL (ref 8.4–10.4)
Chloride: 109 mEq/L (ref 98–109)
Creatinine: 0.9 mg/dL (ref 0.7–1.3)
Glucose: 127 mg/dl (ref 70–140)
Total Protein: 6.7 g/dL (ref 6.4–8.3)

## 2012-12-21 LAB — CBC WITH DIFFERENTIAL/PLATELET
BASO%: 0.5 % (ref 0.0–2.0)
Basophils Absolute: 0 10*3/uL (ref 0.0–0.1)
EOS%: 3.1 % (ref 0.0–7.0)
HGB: 11.1 g/dL — ABNORMAL LOW (ref 13.0–17.1)
MCH: 31.8 pg (ref 27.2–33.4)
MCHC: 33.9 g/dL (ref 32.0–36.0)
MCV: 93.9 fL (ref 79.3–98.0)
MONO%: 15.1 % — ABNORMAL HIGH (ref 0.0–14.0)
NEUT#: 2.9 10*3/uL (ref 1.5–6.5)
NEUT%: 60.9 % (ref 39.0–75.0)
Platelets: 88 10*3/uL — ABNORMAL LOW (ref 140–400)
RDW: 15.5 % — ABNORMAL HIGH (ref 11.0–14.6)
lymph#: 1 10*3/uL (ref 0.9–3.3)

## 2012-12-21 MED ORDER — DEXAMETHASONE SODIUM PHOSPHATE 10 MG/ML IJ SOLN
INTRAMUSCULAR | Status: AC
  Filled 2012-12-21: qty 1

## 2012-12-21 MED ORDER — ONDANSETRON 8 MG/50ML IVPB (CHCC)
8.0000 mg | Freq: Once | INTRAVENOUS | Status: AC
  Administered 2012-12-21: 8 mg via INTRAVENOUS

## 2012-12-21 MED ORDER — DEXTROSE 5 % IV SOLN
Freq: Once | INTRAVENOUS | Status: AC
  Administered 2012-12-21: 11:00:00 via INTRAVENOUS

## 2012-12-21 MED ORDER — SODIUM CHLORIDE 0.9 % IV SOLN
1800.0000 mg/m2 | INTRAVENOUS | Status: DC
  Administered 2012-12-21: 3350 mg via INTRAVENOUS
  Filled 2012-12-21: qty 67

## 2012-12-21 MED ORDER — FLUOROURACIL CHEMO INJECTION 2.5 GM/50ML
300.0000 mg/m2 | Freq: Once | INTRAVENOUS | Status: AC
  Administered 2012-12-21: 550 mg via INTRAVENOUS
  Filled 2012-12-21: qty 11

## 2012-12-21 MED ORDER — OXALIPLATIN CHEMO INJECTION 100 MG/20ML
68.0000 mg/m2 | Freq: Once | INTRAVENOUS | Status: AC
  Administered 2012-12-21: 125 mg via INTRAVENOUS
  Filled 2012-12-21: qty 25

## 2012-12-21 MED ORDER — LEUCOVORIN CALCIUM INJECTION 350 MG
400.0000 mg/m2 | Freq: Once | INTRAVENOUS | Status: AC
  Administered 2012-12-21: 740 mg via INTRAVENOUS
  Filled 2012-12-21: qty 37

## 2012-12-21 MED ORDER — DEXAMETHASONE SODIUM PHOSPHATE 10 MG/ML IJ SOLN
10.0000 mg | Freq: Once | INTRAMUSCULAR | Status: AC
  Administered 2012-12-21: 10 mg via INTRAVENOUS

## 2012-12-21 MED ORDER — ONDANSETRON 8 MG/NS 50 ML IVPB
INTRAVENOUS | Status: AC
  Filled 2012-12-21: qty 8

## 2012-12-21 NOTE — Telephone Encounter (Signed)
Per staff message and POF I have scheduled appts.  JMW  

## 2012-12-21 NOTE — Progress Notes (Addendum)
OFFICE PROGRESS NOTE  Interval history:  [name] is a 72 year old man with metastatic pancreas cancer. He completed cycle 4 FOLFOX on 12/07/2012. He is seen today for scheduled followup.  He denies nausea/vomiting. He developed mouth sores lasting about one week. He noted difficulty eating and drinking during this time. Cold sensitivity lasted approximately one week. He had jaw pain for several days. He also had periodic headaches and stomach cramps. He notes progressive fatigue. His appetite was diminished at suppertime for several days following the chemotherapy. He has had a few loose stools. He notes occasional tingling in the left hand. He denies the persistent neuropathy symptoms.   Objective: Blood pressure 103/55, pulse 65, temperature 98.1 F (36.7 C), temperature source Oral, resp. rate 20, height 5\' 7"  (1.702 m), weight 160 lb 4.8 oz (72.712 kg).  Oropharynx is without thrush or ulceration. Lungs are clear. Regular cardiac rhythm. Port-A-Cath site without erythema. Abdomen is soft and nontender. No hepatomegaly. Extremities are without edema. Calves soft and nontender. Vibratory sense intact at the fingertips per tuning fork exam.  Lab Results: Lab Results  Component Value Date   WBC 4.7 12/21/2012   HGB 11.1* 12/21/2012   HCT 32.8* 12/21/2012   MCV 93.9 12/21/2012   PLT 88* 12/21/2012    Chemistry:    Chemistry      Component Value Date/Time   NA 139 12/21/2012 0912   NA 136 08/18/2012 0457   K 4.1 12/21/2012 0912   K 4.5 08/18/2012 0457   CL 102 08/18/2012 0457   CL 103 08/04/2012 0955   CO2 22 12/21/2012 0912   CO2 27 08/18/2012 0457   BUN 17.0 12/21/2012 0912   BUN 30* 08/18/2012 0457   CREATININE 0.9 12/21/2012 0912   CREATININE 0.73 08/18/2012 0457      Component Value Date/Time   CALCIUM 9.1 12/21/2012 0912   CALCIUM 8.9 08/18/2012 0457   ALKPHOS 125 12/21/2012 0912   ALKPHOS 145* 08/15/2012 0351   AST 24 12/21/2012 0912   AST 58* 08/15/2012 0351   ALT 28  12/21/2012 0912   ALT 85* 08/15/2012 0351   BILITOT 0.60 12/21/2012 0912   BILITOT 0.4 08/15/2012 0351     12/07/2012 CA 19-9 stable at 5720.  Studies/Results: No results found.  Medications: I have reviewed the patient's current medications.  Assessment/Plan:  1. Metastatic pancreas cancer. The clinical presentation, elevated CA 19-9 and abdominal CT consistent with a diagnosis of metastatic pancreas cancer. 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 Initiation of gemcitabine/Abraxane on 07/21/2012. Last treated on 08/04/2012.  The CA 19-9 was improved on 09/01/2012 following 3 treatments with gemcitabine/Abraxane.  Restaging CT evaluation 09/29/2012 showed interval decrease in the left lower lobe pulmonary nodule; interval decrease in the size of the pancreatic head mass with decreasing regional lymphadenopathy and decreasing metastatic disease within the liver. No new or progressive disease noted.  CA 19-9 40775 on 07/07/2012, 1631 on 09/01/2012 and 2105 on 09/29/2012, ~2292 on 10/26/2012, ~ 5765 on 11/23/2012, ~5720 on 12/07/2012. Cycle 1 FOLFOX 10/12/2012, cycle 2 10/26/2012, cycle 3 11/23/2012, cycle 4 12/07/2012. 2. Pain secondary to #1. Resolved. 3. Acute myocardial infarction 06/16/2012 status post PTCA/drug-eluting stent, maintained on Brilinta. 4. Status post Port-A-Cath placement 07/16/2012. 5. Admission with high fever and chills on 08/11/2012. CT of the chest revealed multilobar infiltrates. He was diagnosed with chemotherapy induced pneumonitis. He improved with steroids. Chest x-ray 09/15/2012 showed continued improvement. 6. Mucositis following cycle 4  FOLFOX. 7. Thrombocytopenia secondary to chemotherapy. Progressive.  Disposition-[name]. He has completed 4 cycles of FOLFOX. The CA 19-9 tumor marker was stable 2 weeks ago. We will followup on the value from  today.  He developed mouth sores following cycle 4 FOLFOX. The 5-FU bolus and continuous infusion will be dose reduced beginning with cycle 5 today.  He has progressive thrombocytopenia. The oxaliplatin will be dose reduced beginning with cycle 5 today.  [name] will return for a followup visit and cycle 6 FOLFOX in 2 weeks. He will contact the office in the interim with any problems. We specifically discussed bleeding.  30 minutes spent face-to-face at today's visit with the majority of that time involved in counseling/coordination of care.  Patient seen with Dr. Truett Perna.  Lonna Cobb ANP/GNP-BC   This was a shared visit with Lonna Cobb.  We dose reduced the 5-Fu and oxaliplatin with this cycle.  The plan is for a restaging CT evaluation after cycle 6. He knows to contact us for bleeding.   Mancel Bale, MD

## 2012-12-21 NOTE — Patient Instructions (Signed)
[facility] Discharge Instructions for Patients Receiving Chemotherapy  Today you received the following chemotherapy agents: Oxaliplatin, Leucovorin, 5FU   To help prevent nausea and vomiting after your treatment, we encourage you to take your nausea medication as prescribed.    If you develop nausea and vomiting that is not controlled by your nausea medication, call the clinic.   BELOW ARE SYMPTOMS THAT SHOULD BE REPORTED IMMEDIATELY:  *FEVER GREATER THAN 100.5 F  *CHILLS WITH OR WITHOUT FEVER  NAUSEA AND VOMITING THAT IS NOT CONTROLLED WITH YOUR NAUSEA MEDICATION  *UNUSUAL SHORTNESS OF BREATH  *UNUSUAL BRUISING OR BLEEDING  TENDERNESS IN MOUTH AND THROAT WITH OR WITHOUT PRESENCE OF ULCERS  *URINARY PROBLEMS  *BOWEL PROBLEMS  UNUSUAL RASH Items with * indicate a potential emergency and should be followed up as soon as possible.  Feel free to call the clinic you have any questions or concerns. The clinic phone number is [phone].    

## 2012-12-21 NOTE — Telephone Encounter (Signed)
Gave pt appt for lab and ML, emailed Michelle regarding chemo for 11/18th

## 2012-12-21 NOTE — Progress Notes (Signed)
Per Lonna Cobb, NP, OK to proceed with scheduled FOLFOX chemotherapy with plt count 88 today

## 2012-12-22 ENCOUNTER — Encounter: Payer: Self-pay | Admitting: Oncology

## 2012-12-23 ENCOUNTER — Ambulatory Visit (HOSPITAL_BASED_OUTPATIENT_CLINIC_OR_DEPARTMENT_OTHER): Payer: Medicare Other

## 2012-12-23 ENCOUNTER — Telehealth: Payer: Self-pay | Admitting: Oncology

## 2012-12-23 DIAGNOSIS — Z452 Encounter for adjustment and management of vascular access device: Secondary | ICD-10-CM

## 2012-12-23 DIAGNOSIS — C259 Malignant neoplasm of pancreas, unspecified: Secondary | ICD-10-CM

## 2012-12-23 MED ORDER — SODIUM CHLORIDE 0.9 % IJ SOLN
10.0000 mL | INTRAMUSCULAR | Status: DC | PRN
  Administered 2012-12-23: 10 mL
  Filled 2012-12-23: qty 10

## 2012-12-23 MED ORDER — HEPARIN SOD (PORK) LOCK FLUSH 100 UNIT/ML IV SOLN
500.0000 [IU] | Freq: Once | INTRAVENOUS | Status: AC | PRN
  Administered 2012-12-23: 500 [IU]
  Filled 2012-12-23: qty 5

## 2012-12-23 NOTE — Telephone Encounter (Signed)
Talked to pt and he is aware of all November appts lab, chemo  and MD

## 2012-12-23 NOTE — Telephone Encounter (Signed)
Talked to to pt and he is aware of appt for November 2014 lab, chemo and winter

## 2012-12-24 ENCOUNTER — Encounter: Payer: Self-pay | Admitting: Nurse Practitioner

## 2013-01-03 ENCOUNTER — Other Ambulatory Visit: Payer: Self-pay | Admitting: Oncology

## 2013-01-04 ENCOUNTER — Telehealth: Payer: Self-pay | Admitting: Oncology

## 2013-01-04 ENCOUNTER — Ambulatory Visit (HOSPITAL_BASED_OUTPATIENT_CLINIC_OR_DEPARTMENT_OTHER): Payer: Medicare Other | Admitting: Oncology

## 2013-01-04 ENCOUNTER — Ambulatory Visit (HOSPITAL_BASED_OUTPATIENT_CLINIC_OR_DEPARTMENT_OTHER): Payer: Medicare Other

## 2013-01-04 ENCOUNTER — Other Ambulatory Visit (HOSPITAL_BASED_OUTPATIENT_CLINIC_OR_DEPARTMENT_OTHER): Payer: Medicare Other | Admitting: Lab

## 2013-01-04 VITALS — BP 107/55 | HR 58 | Temp 97.8°F | Resp 18 | Ht 67.0 in | Wt 159.0 lb

## 2013-01-04 DIAGNOSIS — C787 Secondary malignant neoplasm of liver and intrahepatic bile duct: Secondary | ICD-10-CM

## 2013-01-04 DIAGNOSIS — C259 Malignant neoplasm of pancreas, unspecified: Secondary | ICD-10-CM

## 2013-01-04 DIAGNOSIS — C250 Malignant neoplasm of head of pancreas: Secondary | ICD-10-CM

## 2013-01-04 DIAGNOSIS — Z5111 Encounter for antineoplastic chemotherapy: Secondary | ICD-10-CM

## 2013-01-04 DIAGNOSIS — R911 Solitary pulmonary nodule: Secondary | ICD-10-CM

## 2013-01-04 LAB — CBC WITH DIFFERENTIAL/PLATELET
Basophils Absolute: 0 10*3/uL (ref 0.0–0.1)
Eosinophils Absolute: 0.2 10*3/uL (ref 0.0–0.5)
HGB: 10.9 g/dL — ABNORMAL LOW (ref 13.0–17.1)
LYMPH%: 18.7 % (ref 14.0–49.0)
MCHC: 33.2 g/dL (ref 32.0–36.0)
NEUT#: 3 10*3/uL (ref 1.5–6.5)
Platelets: 91 10*3/uL — ABNORMAL LOW (ref 140–400)
RDW: 16.3 % — ABNORMAL HIGH (ref 11.0–14.6)
lymph#: 0.9 10*3/uL (ref 0.9–3.3)

## 2013-01-04 LAB — COMPREHENSIVE METABOLIC PANEL (CC13)
ALT: 27 U/L (ref 0–55)
Alkaline Phosphatase: 131 U/L (ref 40–150)
Anion Gap: 10 mEq/L (ref 3–11)
CO2: 22 mEq/L (ref 22–29)
Creatinine: 0.9 mg/dL (ref 0.7–1.3)
Sodium: 140 mEq/L (ref 136–145)
Total Bilirubin: 0.75 mg/dL (ref 0.20–1.20)

## 2013-01-04 MED ORDER — FLUOROURACIL CHEMO INJECTION 2.5 GM/50ML
300.0000 mg/m2 | Freq: Once | INTRAVENOUS | Status: AC
  Administered 2013-01-04: 550 mg via INTRAVENOUS
  Filled 2013-01-04: qty 11

## 2013-01-04 MED ORDER — DEXAMETHASONE SODIUM PHOSPHATE 10 MG/ML IJ SOLN
10.0000 mg | Freq: Once | INTRAMUSCULAR | Status: AC
  Administered 2013-01-04: 10 mg via INTRAVENOUS

## 2013-01-04 MED ORDER — LEUCOVORIN CALCIUM INJECTION 350 MG
400.0000 mg/m2 | Freq: Once | INTRAVENOUS | Status: DC
  Filled 2013-01-04: qty 37

## 2013-01-04 MED ORDER — SODIUM CHLORIDE 0.9 % IV SOLN
1800.0000 mg/m2 | INTRAVENOUS | Status: DC
  Administered 2013-01-04: 3350 mg via INTRAVENOUS
  Filled 2013-01-04: qty 67

## 2013-01-04 MED ORDER — DEXAMETHASONE SODIUM PHOSPHATE 10 MG/ML IJ SOLN
INTRAMUSCULAR | Status: AC
  Filled 2013-01-04: qty 1

## 2013-01-04 MED ORDER — DEXTROSE 5 % IV SOLN
Freq: Once | INTRAVENOUS | Status: AC
  Administered 2013-01-04: 13:00:00 via INTRAVENOUS

## 2013-01-04 MED ORDER — OXALIPLATIN CHEMO INJECTION 100 MG/20ML
68.0000 mg/m2 | Freq: Once | INTRAVENOUS | Status: AC
  Administered 2013-01-04: 125 mg via INTRAVENOUS
  Filled 2013-01-04: qty 25

## 2013-01-04 MED ORDER — ONDANSETRON 8 MG/NS 50 ML IVPB
INTRAVENOUS | Status: AC
  Filled 2013-01-04: qty 8

## 2013-01-04 MED ORDER — LEUCOVORIN CALCIUM INJECTION 350 MG
300.0000 mg/m2 | Freq: Once | INTRAVENOUS | Status: AC
  Administered 2013-01-04: 556 mg via INTRAVENOUS
  Filled 2013-01-04: qty 27.8

## 2013-01-04 MED ORDER — ONDANSETRON 8 MG/50ML IVPB (CHCC)
8.0000 mg | Freq: Once | INTRAVENOUS | Status: AC
  Administered 2013-01-04: 8 mg via INTRAVENOUS

## 2013-01-04 NOTE — Patient Instructions (Signed)
[facility] Discharge Instructions for Patients Receiving Chemotherapy  Today you received the following chemotherapy agents: Oxaliplatin, Leucovorin, 5FU   To help prevent nausea and vomiting after your treatment, we encourage you to take your nausea medication as prescribed.    If you develop nausea and vomiting that is not controlled by your nausea medication, call the clinic.   BELOW ARE SYMPTOMS THAT SHOULD BE REPORTED IMMEDIATELY:  *FEVER GREATER THAN 100.5 F  *CHILLS WITH OR WITHOUT FEVER  NAUSEA AND VOMITING THAT IS NOT CONTROLLED WITH YOUR NAUSEA MEDICATION  *UNUSUAL SHORTNESS OF BREATH  *UNUSUAL BRUISING OR BLEEDING  TENDERNESS IN MOUTH AND THROAT WITH OR WITHOUT PRESENCE OF ULCERS  *URINARY PROBLEMS  *BOWEL PROBLEMS  UNUSUAL RASH Items with * indicate a potential emergency and should be followed up as soon as possible.  Feel free to call the clinic you have any questions or concerns. The clinic phone number is [phone].    

## 2013-01-04 NOTE — Progress Notes (Signed)
Per Demetrios Isaacs, charge RN via Dr. Truett Perna, OK to treat with plt 91 today. Will proceed with chemo as scheduled.

## 2013-01-04 NOTE — Telephone Encounter (Signed)
gv and pritned appt sched and avs for pt for NOV...gv pt barium

## 2013-01-04 NOTE — Progress Notes (Signed)
   [facility]    OFFICE PROGRESS NOTE   INTERVAL HISTORY:   [name] completed another cycle of FOLFOX on 12/21/2012. No mouth ulcers or nausea following this cycle of chemotherapy. He reports abdominal pain beginning on day 2. He took one pain pill and the discomfort resolved by 12/23/2012. No pain at present. Cold sensitivity lasted for 5 days after chemotherapy. No neuropathy symptoms at present. Mild bleeding when he blows his nose.  Objective:  Vital signs in last 24 hours:  Blood pressure 107/55, pulse 58, temperature 97.8 F (36.6 C), temperature source Oral, resp. rate 18, height 5\' 7"  (1.702 m), weight 159 lb (72.122 kg).    HEENT: No thrush or ulcers. Resp: Lungs clear bilaterally Cardio: Regular rate and rhythm GI: No hepatosplenomegaly, nontender, no mass Vascular: No leg edema Neuro: The vibratory sense is intact at the fingertips bilaterally    Portacath/PICC-without erythema  Lab Results:  Lab Results  Component Value Date   WBC 4.7 01/04/2013   HGB 10.9* 01/04/2013   HCT 32.9* 01/04/2013   MCV 94.6 01/04/2013   PLT 91* 01/04/2013   ANC 3.0  CA 19-9 on 12/21/2012-4585    Medications: I have reviewed the patient's current medications.  Assessment/Plan: 1. Metastatic pancreas cancer. The clinical presentation, elevated CA 19-9 and abdominal CT consistent with a diagnosis of metastatic pancreas cancer. 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 Initiation of gemcitabine/Abraxane on 07/21/2012. Last treated on 08/04/2012.  The CA 19-9 was improved on 09/01/2012 following 3 treatments with gemcitabine/Abraxane.  Restaging CT evaluation 09/29/2012 showed interval decrease in the left lower lobe pulmonary nodule; interval decrease in the size of the pancreatic head mass with decreasing regional lymphadenopathy and decreasing metastatic disease within the  liver. No new or progressive disease noted.  CA 19-9 40775 on 07/07/2012, 1631 on 09/01/2012 and 2105 on 09/29/2012, ~2292 on 10/26/2012, ~ 5765 on 11/23/2012, ~5720 on 12/07/2012.  Cycle 1 FOLFOX 10/12/2012, cycle 2 10/26/2012, cycle 3 11/23/2012, cycle 4 12/07/2012, cycle 5 12/21/2012 2. Pain secondary to #1. Resolved. 3. Acute myocardial infarction 06/16/2012 status post PTCA/drug-eluting stent, maintained on Brilinta. 4. Status post Port-A-Cath placement 07/16/2012. 5. Admission with high fever and chills on 08/11/2012. CT of the chest revealed multilobar infiltrates. He was diagnosed with chemotherapy induced pneumonitis. He improved with steroids. Chest x-ray 09/15/2012 showed continued improvement. 6. Mucositis following cycle 4 FOLFOX. The 5-FU was dose reduced beginning with cycle 5 7. Thrombocytopenia secondary to chemotherapy. The oxaliplatin was dose reduced beginning with cycle 5 FOLFOX   Disposition:  He tolerated cycle 5 of FOLFOX with less toxicity. The 5-fluorouracil and oxaliplatin were dose reduced with cycle 5. The platelet count is stable today. The plan is to proceed with cycle 6 FOLFOX today. He will undergo a restaging CT evaluation prior to an office visit in 2 weeks. The CA 19-9 was slightly lower on 12/21/2012.    Thornton Papas, MD  01/04/2013  1:36 PM

## 2013-01-06 ENCOUNTER — Ambulatory Visit (HOSPITAL_BASED_OUTPATIENT_CLINIC_OR_DEPARTMENT_OTHER): Payer: Medicare Other

## 2013-01-06 ENCOUNTER — Telehealth: Payer: Self-pay | Admitting: *Deleted

## 2013-01-06 VITALS — BP 113/44 | HR 58 | Temp 98.2°F | Resp 18

## 2013-01-06 DIAGNOSIS — C250 Malignant neoplasm of head of pancreas: Secondary | ICD-10-CM

## 2013-01-06 DIAGNOSIS — Z452 Encounter for adjustment and management of vascular access device: Secondary | ICD-10-CM

## 2013-01-06 DIAGNOSIS — C259 Malignant neoplasm of pancreas, unspecified: Secondary | ICD-10-CM

## 2013-01-06 DIAGNOSIS — C787 Secondary malignant neoplasm of liver and intrahepatic bile duct: Secondary | ICD-10-CM

## 2013-01-06 MED ORDER — HEPARIN SOD (PORK) LOCK FLUSH 100 UNIT/ML IV SOLN
500.0000 [IU] | Freq: Once | INTRAVENOUS | Status: AC | PRN
  Administered 2013-01-06: 500 [IU]
  Filled 2013-01-06: qty 5

## 2013-01-06 MED ORDER — SODIUM CHLORIDE 0.9 % IJ SOLN
10.0000 mL | INTRAMUSCULAR | Status: DC | PRN
  Administered 2013-01-06: 10 mL
  Filled 2013-01-06: qty 10

## 2013-01-06 NOTE — Telephone Encounter (Signed)
Message copied by Caleb Popp on Thu Jan 06, 2013 10:01 AM ------      Message from: Thornton Papas B      Created: Wed Jan 05, 2013  7:45 PM       Please call patient, ca10-9 is higher, proceed with CT as scheduled ------

## 2013-01-06 NOTE — Telephone Encounter (Signed)
Called pt, he has viewed his results on MyChart. Denies any questions. Instructed him to proceed with CT as scheduled, per Dr.Sherrill. He voiced understanding.

## 2013-01-16 ENCOUNTER — Other Ambulatory Visit: Payer: Self-pay | Admitting: Oncology

## 2013-01-17 ENCOUNTER — Ambulatory Visit (HOSPITAL_COMMUNITY)
Admission: RE | Admit: 2013-01-17 | Discharge: 2013-01-17 | Disposition: A | Discharge status: Home / Self Care | Payer: Medicare Other | Source: Ambulatory Visit | Attending: Oncology | Admitting: Oncology

## 2013-01-17 DIAGNOSIS — C787 Secondary malignant neoplasm of liver and intrahepatic bile duct: Secondary | ICD-10-CM | POA: Insufficient documentation

## 2013-01-17 DIAGNOSIS — R918 Other nonspecific abnormal finding of lung field: Secondary | ICD-10-CM | POA: Insufficient documentation

## 2013-01-17 DIAGNOSIS — I81 Portal vein thrombosis: Secondary | ICD-10-CM | POA: Insufficient documentation

## 2013-01-17 DIAGNOSIS — K55059 Acute (reversible) ischemia of intestine, part and extent unspecified: Secondary | ICD-10-CM | POA: Insufficient documentation

## 2013-01-17 DIAGNOSIS — M47817 Spondylosis without myelopathy or radiculopathy, lumbosacral region: Secondary | ICD-10-CM | POA: Insufficient documentation

## 2013-01-17 DIAGNOSIS — I2510 Atherosclerotic heart disease of native coronary artery without angina pectoris: Secondary | ICD-10-CM | POA: Insufficient documentation

## 2013-01-17 DIAGNOSIS — R599 Enlarged lymph nodes, unspecified: Secondary | ICD-10-CM | POA: Insufficient documentation

## 2013-01-17 DIAGNOSIS — I700 Atherosclerosis of aorta: Secondary | ICD-10-CM | POA: Insufficient documentation

## 2013-01-17 DIAGNOSIS — C259 Malignant neoplasm of pancreas, unspecified: Secondary | ICD-10-CM

## 2013-01-17 MED ORDER — IOHEXOL 300 MG/ML  SOLN
100.0000 mL | Freq: Once | INTRAMUSCULAR | Status: AC | PRN
  Administered 2013-01-17: 100 mL via INTRAVENOUS

## 2013-01-18 ENCOUNTER — Ambulatory Visit (HOSPITAL_BASED_OUTPATIENT_CLINIC_OR_DEPARTMENT_OTHER): Payer: Medicare Other | Admitting: Nurse Practitioner

## 2013-01-18 ENCOUNTER — Telehealth: Payer: Self-pay | Admitting: Oncology

## 2013-01-18 ENCOUNTER — Other Ambulatory Visit: Payer: Self-pay | Admitting: Oncology

## 2013-01-18 ENCOUNTER — Ambulatory Visit (HOSPITAL_BASED_OUTPATIENT_CLINIC_OR_DEPARTMENT_OTHER): Payer: Medicare Other

## 2013-01-18 ENCOUNTER — Other Ambulatory Visit (HOSPITAL_BASED_OUTPATIENT_CLINIC_OR_DEPARTMENT_OTHER): Payer: Medicare Other | Admitting: Lab

## 2013-01-18 ENCOUNTER — Telehealth: Payer: Self-pay | Admitting: *Deleted

## 2013-01-18 VITALS — BP 117/59 | HR 62 | Temp 97.8°F | Resp 18 | Ht 67.0 in | Wt 160.0 lb

## 2013-01-18 DIAGNOSIS — D696 Thrombocytopenia, unspecified: Secondary | ICD-10-CM

## 2013-01-18 DIAGNOSIS — Z5111 Encounter for antineoplastic chemotherapy: Secondary | ICD-10-CM

## 2013-01-18 DIAGNOSIS — R109 Unspecified abdominal pain: Secondary | ICD-10-CM

## 2013-01-18 DIAGNOSIS — C787 Secondary malignant neoplasm of liver and intrahepatic bile duct: Secondary | ICD-10-CM

## 2013-01-18 DIAGNOSIS — C250 Malignant neoplasm of head of pancreas: Secondary | ICD-10-CM

## 2013-01-18 DIAGNOSIS — C259 Malignant neoplasm of pancreas, unspecified: Secondary | ICD-10-CM

## 2013-01-18 LAB — CBC WITH DIFFERENTIAL/PLATELET
Basophils Absolute: 0 10*3/uL (ref 0.0–0.1)
EOS%: 3.9 % (ref 0.0–7.0)
HCT: 30.2 % — ABNORMAL LOW (ref 38.4–49.9)
HGB: 10.4 g/dL — ABNORMAL LOW (ref 13.0–17.1)
MCH: 32.4 pg (ref 27.2–33.4)
MCV: 94.6 fL (ref 79.3–98.0)
MONO%: 12.4 % (ref 0.0–14.0)
NEUT%: 65.4 % (ref 39.0–75.0)
RBC: 3.2 10*6/uL — ABNORMAL LOW (ref 4.20–5.82)

## 2013-01-18 LAB — COMPREHENSIVE METABOLIC PANEL (CC13)
AST: 26 U/L (ref 5–34)
Alkaline Phosphatase: 148 U/L (ref 40–150)
Anion Gap: 9 mEq/L (ref 3–11)
BUN: 16.5 mg/dL (ref 7.0–26.0)
CO2: 24 mEq/L (ref 22–29)
Calcium: 9.3 mg/dL (ref 8.4–10.4)
Creatinine: 0.9 mg/dL (ref 0.7–1.3)
Potassium: 4.2 mEq/L (ref 3.5–5.1)

## 2013-01-18 MED ORDER — SODIUM CHLORIDE 0.9 % IJ SOLN
10.0000 mL | INTRAMUSCULAR | Status: DC | PRN
  Filled 2013-01-18: qty 10

## 2013-01-18 MED ORDER — ONDANSETRON 8 MG/50ML IVPB (CHCC)
8.0000 mg | Freq: Once | INTRAVENOUS | Status: AC
  Administered 2013-01-18: 8 mg via INTRAVENOUS

## 2013-01-18 MED ORDER — ONDANSETRON 8 MG/NS 50 ML IVPB
INTRAVENOUS | Status: AC
  Filled 2013-01-18: qty 8

## 2013-01-18 MED ORDER — SODIUM CHLORIDE 0.9 % IV SOLN
Freq: Once | INTRAVENOUS | Status: AC
  Administered 2013-01-18: 11:00:00 via INTRAVENOUS

## 2013-01-18 MED ORDER — DEXAMETHASONE SODIUM PHOSPHATE 10 MG/ML IJ SOLN
10.0000 mg | Freq: Once | INTRAMUSCULAR | Status: AC
  Administered 2013-01-18: 10 mg via INTRAVENOUS

## 2013-01-18 MED ORDER — PACLITAXEL PROTEIN-BOUND CHEMO INJECTION 100 MG
100.0000 mg/m2 | Freq: Once | INTRAVENOUS | Status: AC
  Administered 2013-01-18: 175 mg via INTRAVENOUS
  Filled 2013-01-18: qty 35

## 2013-01-18 MED ORDER — HEPARIN SOD (PORK) LOCK FLUSH 100 UNIT/ML IV SOLN
500.0000 [IU] | Freq: Once | INTRAVENOUS | Status: DC | PRN
  Filled 2013-01-18: qty 5

## 2013-01-18 MED ORDER — DEXAMETHASONE SODIUM PHOSPHATE 10 MG/ML IJ SOLN
INTRAMUSCULAR | Status: AC
  Filled 2013-01-18: qty 1

## 2013-01-18 NOTE — Telephone Encounter (Signed)
appts made per 11/18 POF sw pt and adv same Pt will review on My Chart shh

## 2013-01-18 NOTE — Progress Notes (Addendum)
OFFICE PROGRESS NOTE  Interval history:  Randall [name] returns for followup of metastatic pancreas cancer. He completed cycle 6 FOLFOX on 01/04/2013. He denies nausea/vomiting. No mouth sores. He has periodic loose stools. He notes prolonged cold sensitivity. Appetite is poor. He notes an alteration in taste. He has recently noted recurrent abdominal pain and bloating typically occurring in the afternoon hours. He has taken some Vicodin. He denies any fevers. He periodically feels "chilled". He has intermittent nosebleeds.   Objective: Blood pressure 117/59, pulse 62, temperature 97.8 F (36.6 C), temperature source Oral, resp. rate 18, height 5\' 7"  (1.702 m), weight 160 lb (72.576 kg).  Oropharynx is without thrush or ulceration. Lungs with faint rales at the right base. Otherwise clear. Port-A-Cath site is without erythema. Regular cardiac rhythm. Abdomen soft and nontender. No organomegaly. No mass. No leg edema. Calves soft and nontender.  Lab Results: Lab Results  Component Value Date   WBC 3.8* 01/18/2013   HGB 10.4* 01/18/2013   HCT 30.2* 01/18/2013   MCV 94.6 01/18/2013   PLT 85* 01/18/2013    Chemistry:    Chemistry      Component Value Date/Time   NA 139 01/18/2013 0911   NA 136 08/18/2012 0457   K 4.2 01/18/2013 0911   K 4.5 08/18/2012 0457   CL 102 08/18/2012 0457   CL 103 08/04/2012 0955   CO2 24 01/18/2013 0911   CO2 27 08/18/2012 0457   BUN 16.5 01/18/2013 0911   BUN 30* 08/18/2012 0457   CREATININE 0.9 01/18/2013 0911   CREATININE 0.73 08/18/2012 0457      Component Value Date/Time   CALCIUM 9.3 01/18/2013 0911   CALCIUM 8.9 08/18/2012 0457   ALKPHOS 148 01/18/2013 0911   ALKPHOS 145* 08/15/2012 0351   AST 26 01/18/2013 0911   AST 58* 08/15/2012 0351   ALT 24 01/18/2013 0911   ALT 85* 08/15/2012 0351   BILITOT 0.59 01/18/2013 0911   BILITOT 0.4 08/15/2012 0351       Studies/Results: Ct Chest W Contrast  01/17/2013   CLINICAL DATA:  Pancreas cancer  EXAM: CT  CHEST, ABDOMEN, AND PELVIS WITH CONTRAST  TECHNIQUE: Multidetector CT imaging of the chest, abdomen and pelvis was performed following the standard protocol during bolus administration of intravenous contrast.  CONTRAST:  OMNIPAQUE IOHEXOL 300 MG/ML  SOLN  COMPARISON:  CT abdomen and pelvis from 09/29/2012 and CT chest from 08/11/2012  FINDINGS: CT CHEST FINDINGS  Previous 6 mm left lower lobe nodule has resolved in the interval. There are multiple new nodules identified scattered throughout both lungs. Index nodule in the right upper lobe measures 3 mm, image 25/8. Also in the right upper lobe is a 3 mm nodule, image 25/8. New right middle lobe nodule measures 3 mm,/series 8. There are multiple small (sub cm subpleural nodules identified in the right base and along the major fissure of the right lung.  The heart size is normal. No pericardial effusion. Stent is identified within the LAD coronary artery. Calcifications are noted involving the RCA and left circumflex coronary arteries. No mediastinal or hilar adenopathy.  No axillary or supraclavicular adenopathy. Review of the visualized osseous structures shows no aggressive lytic or sclerotic bone lesions.  CT ABDOMEN AND PELVIS FINDINGS  Multi focal liver metastasis are again noted. Lesion within the left hepatic lobe measures 2.3 cm, image 17/series 2. Previously 3.0 cm. Index lesion within the right hepatic lobe measure 2.6 cm, image 26/ series 2. Previously 1.6 cm. Adjacent  liver lesion measures 2.1 cm, image 26/ series 2. Previously this measured the same. Dome of liver lesion measures 2.1 cm, image number 17/series 2. Previously 1.7 cm.  The gallbladder contains multiple small calcifications. No biliary dilatation. Lesion within the head of pancreas measures 2.9 cm, image 48/ series 2. Previously 2.2 cm. Adjacent lymph node is stable measuring 2.5 cm, image 50/series 2. There is a enlarging aortocaval lymph node which now measures 1.8 cm, image  65/series 4. Previously 0.8 cm. The spleen measures 13.3 cm in length. Previously 12 cm.  The adrenal glands are both normal. Normal appearance of both kidneys. There is calcified atherosclerotic change involving the ectatic abdominal aorta. The infrarenal abdominal aorta measures up to the 2.9 cm in AP dimension.  Thrombus within the superior mesenteric vein extending into the portal venous confluence is again identified and appears similar to previous exam, image 63/series 4. Small clot within the left hepatic lobe branch of the portal vein is again noted, image 58/ series 4.  Review of the visualized osseous structures shows advanced changes of lumbar spondylosis. No aggressive lytic or sclerotic bone lesions.  IMPRESSION: 1. Although the dominant nodule in the left lower lobe has resolved in the interval, there are multiple new parenchymal and subpleural nodules identified within the right lung. These are nonspecific and may be related to metastatic disease. 2. The pancreatic head mass with regional adenopathy has progressed from previous exam. 3. Persistent multi focal liver metastasis. These demonstrate a mixed response compared with previous exam. Some lesions have decreased in the interval, some of remain stable, and others have increased in the interval. 4. Similar appearance of nonocclusive thrombus within the superior mesenteric vein and left portal vein.   Electronically Signed   By: Signa Kell M.D.   On: 01/17/2013 10:34   Ct Abdomen W Contrast  01/17/2013   CLINICAL DATA:  Pancreas cancer  EXAM: CT CHEST, ABDOMEN, AND PELVIS WITH CONTRAST  TECHNIQUE: Multidetector CT imaging of the chest, abdomen and pelvis was performed following the standard protocol during bolus administration of intravenous contrast.  CONTRAST:  OMNIPAQUE IOHEXOL 300 MG/ML  SOLN  COMPARISON:  CT abdomen and pelvis from 09/29/2012 and CT chest from 08/11/2012  FINDINGS: CT CHEST FINDINGS  Previous 6 mm left lower lobe  nodule has resolved in the interval. There are multiple new nodules identified scattered throughout both lungs. Index nodule in the right upper lobe measures 3 mm, image 25/8. Also in the right upper lobe is a 3 mm nodule, image 25/8. New right middle lobe nodule measures 3 mm,/series 8. There are multiple small (sub cm subpleural nodules identified in the right base and along the major fissure of the right lung.  The heart size is normal. No pericardial effusion. Stent is identified within the LAD coronary artery. Calcifications are noted involving the RCA and left circumflex coronary arteries. No mediastinal or hilar adenopathy.  No axillary or supraclavicular adenopathy. Review of the visualized osseous structures shows no aggressive lytic or sclerotic bone lesions.  CT ABDOMEN AND PELVIS FINDINGS  Multi focal liver metastasis are again noted. Lesion within the left hepatic lobe measures 2.3 cm, image 17/series 2. Previously 3.0 cm. Index lesion within the right hepatic lobe measure 2.6 cm, image 26/ series 2. Previously 1.6 cm. Adjacent liver lesion measures 2.1 cm, image 26/ series 2. Previously this measured the same. Dome of liver lesion measures 2.1 cm, image number 17/series 2. Previously 1.7 cm.  The gallbladder contains multiple small  calcifications. No biliary dilatation. Lesion within the head of pancreas measures 2.9 cm, image 48/ series 2. Previously 2.2 cm. Adjacent lymph node is stable measuring 2.5 cm, image 50/series 2. There is a enlarging aortocaval lymph node which now measures 1.8 cm, image 65/series 4. Previously 0.8 cm. The spleen measures 13.3 cm in length. Previously 12 cm.  The adrenal glands are both normal. Normal appearance of both kidneys. There is calcified atherosclerotic change involving the ectatic abdominal aorta. The infrarenal abdominal aorta measures up to the 2.9 cm in AP dimension.  Thrombus within the superior mesenteric vein extending into the portal venous confluence is  again identified and appears similar to previous exam, image 63/series 4. Small clot within the left hepatic lobe branch of the portal vein is again noted, image 58/ series 4.  Review of the visualized osseous structures shows advanced changes of lumbar spondylosis. No aggressive lytic or sclerotic bone lesions.  IMPRESSION: 1. Although the dominant nodule in the left lower lobe has resolved in the interval, there are multiple new parenchymal and subpleural nodules identified within the right lung. These are nonspecific and may be related to metastatic disease. 2. The pancreatic head mass with regional adenopathy has progressed from previous exam. 3. Persistent multi focal liver metastasis. These demonstrate a mixed response compared with previous exam. Some lesions have decreased in the interval, some of remain stable, and others have increased in the interval. 4. Similar appearance of nonocclusive thrombus within the superior mesenteric vein and left portal vein.   Electronically Signed   By: Signa Kell M.D.   On: 01/17/2013 10:34    Medications: I have reviewed the patient's current medications.  Assessment/Plan:  1. Metastatic pancreas cancer. The clinical presentation, elevated CA 19-9 and abdominal CT consistent with a diagnosis of metastatic pancreas cancer. 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 Initiation of gemcitabine/Abraxane on 07/21/2012. Last treated on 08/04/2012.  The CA 19-9 was improved on 09/01/2012 following 3 treatments with gemcitabine/Abraxane.  Restaging CT evaluation 09/29/2012 showed interval decrease in the left lower lobe pulmonary nodule; interval decrease in the size of the pancreatic head mass with decreasing regional lymphadenopathy and decreasing metastatic disease within the liver. No new or progressive disease noted.  CA 19-9 40775 on 07/07/2012, 1631 on 09/01/2012 and  2105 on 09/29/2012, ~2292 on 10/26/2012, ~ 5765 on 11/23/2012, ~5720 on 12/07/2012, 8383 on 01/04/2013.  Cycle 1 FOLFOX 10/12/2012, cycle 2 10/26/2012, cycle 3 11/23/2012, cycle 4 12/07/2012, cycle 5 12/21/2012, cycle 6 01/04/2013. Restaging CT evaluation 01/17/2013 showed multiple new parenchymal and subpleural nodules, nonspecific; increased pancreatic head mass/regional adenopathy; mixed response in the liver. 2. Pain secondary to #1. Resolved. 3. Acute myocardial infarction 06/16/2012 status post PTCA/drug-eluting stent, maintained on Brilinta. 4. Status post Port-A-Cath placement 07/16/2012. 5. Admission with high fever and chills on 08/11/2012. CT of the chest revealed multilobar infiltrates. He was diagnosed with chemotherapy induced pneumonitis. He improved with steroids. Chest x-ray 09/15/2012 showed continued improvement. 6. Mucositis following cycle 4 FOLFOX. The 5-FU was dose reduced beginning with cycle 5 7. Thrombocytopenia secondary to chemotherapy. The oxaliplatin was dose reduced beginning with cycle 5 FOLFOX.  Disposition-the recent restaging CT evaluation shows evidence of progression. Dr. Truett Perna reviewed the CT findings and images with Randall [name].  Dr. Truett Perna recommends discontinuation of FOLFOX and initiation of single agent Abraxane. We reviewed potential toxicities associated with Abraxane including myelosuppression, nausea, allergic reaction, hair loss, peripheral neuropathy. He is agreeable  to proceed with cycle one Abraxane today. The Abraxane will be given weekly x3 followed by a one-week break. He requests to not receive treatment the week of Thanksgiving.  He understands the pneumonitis he experienced following gemcitabine/Abraxane was most likely related to gemcitabine but potentially could have been related to Abraxane. He is willing to accept this risk.  He will return for a followup visit prior to treatment in 2 weeks. He will contact the office in the interim  with any problems. We specifically discussed cough, shortness of breath.   Patient seen with Dr. Truett Perna.  30 minutes were spent face-to-face at today's visit with the majority of that time involved in counseling/coordination of care.  Lonna Cobb ANP/GNP-BC   This was a shared visit with Lonna Cobb. The increased pain, elevated tumor marker, and CT images are consistent with disease progression. I reviewed the restaging images with Randall [name]. The plan is to proceed with single agent Abraxane as above. We will follow his clinical status and the CA 19-9 closely.   Mancel Bale, M.D.

## 2013-01-18 NOTE — Telephone Encounter (Signed)
Per staff message and POF I have scheduled appts.  JMW  

## 2013-01-18 NOTE — Patient Instructions (Signed)
[facility] Discharge Instructions for Patients Receiving Chemotherapy  Today you received the following chemotherapy agents Abraxane.   To help prevent nausea and vomiting after your treatment, we encourage you to take your nausea medication.   If you develop nausea and vomiting that is not controlled by your nausea medication, call the clinic.   BELOW ARE SYMPTOMS THAT SHOULD BE REPORTED IMMEDIATELY:  *FEVER GREATER THAN 100.5 F  *CHILLS WITH OR WITHOUT FEVER  NAUSEA AND VOMITING THAT IS NOT CONTROLLED WITH YOUR NAUSEA MEDICATION  *UNUSUAL SHORTNESS OF BREATH  *UNUSUAL BRUISING OR BLEEDING  TENDERNESS IN MOUTH AND THROAT WITH OR WITHOUT PRESENCE OF ULCERS  *URINARY PROBLEMS  *BOWEL PROBLEMS  UNUSUAL RASH Items with * indicate a potential emergency and should be followed up as soon as possible.  Feel free to call the clinic you have any questions or concerns. The clinic phone number is [phone].    

## 2013-01-19 LAB — CANCER ANTIGEN 19-9: CA 19-9: 9923.4 U/mL — ABNORMAL HIGH (ref <35.0)

## 2013-01-24 ENCOUNTER — Encounter: Payer: Self-pay | Admitting: Oncology

## 2013-01-24 ENCOUNTER — Telehealth: Payer: Self-pay | Admitting: *Deleted

## 2013-01-24 NOTE — Telephone Encounter (Signed)
Called patient to follow up on his pain med request via My Chart. Confirmed he does not need the med now and has enough to last till 02/01/13 visit. Instructed him to just ask for script when he is in the office on 12/2.

## 2013-02-01 ENCOUNTER — Telehealth: Payer: Self-pay | Admitting: Oncology

## 2013-02-01 ENCOUNTER — Other Ambulatory Visit (HOSPITAL_BASED_OUTPATIENT_CLINIC_OR_DEPARTMENT_OTHER): Payer: Medicare Other

## 2013-02-01 ENCOUNTER — Ambulatory Visit (HOSPITAL_BASED_OUTPATIENT_CLINIC_OR_DEPARTMENT_OTHER): Payer: Medicare Other

## 2013-02-01 ENCOUNTER — Ambulatory Visit (HOSPITAL_BASED_OUTPATIENT_CLINIC_OR_DEPARTMENT_OTHER): Payer: Medicare Other | Admitting: Nurse Practitioner

## 2013-02-01 VITALS — BP 104/60 | HR 66 | Temp 97.2°F | Resp 20 | Ht 67.0 in | Wt 160.6 lb

## 2013-02-01 DIAGNOSIS — C250 Malignant neoplasm of head of pancreas: Secondary | ICD-10-CM

## 2013-02-01 DIAGNOSIS — C259 Malignant neoplasm of pancreas, unspecified: Secondary | ICD-10-CM

## 2013-02-01 DIAGNOSIS — C787 Secondary malignant neoplasm of liver and intrahepatic bile duct: Secondary | ICD-10-CM

## 2013-02-01 DIAGNOSIS — D6959 Other secondary thrombocytopenia: Secondary | ICD-10-CM

## 2013-02-01 DIAGNOSIS — Z5111 Encounter for antineoplastic chemotherapy: Secondary | ICD-10-CM

## 2013-02-01 LAB — COMPREHENSIVE METABOLIC PANEL (CC13)
Albumin: 3.5 g/dL (ref 3.5–5.0)
Anion Gap: 13 mEq/L — ABNORMAL HIGH (ref 3–11)
CO2: 24 mEq/L (ref 22–29)
Calcium: 9.2 mg/dL (ref 8.4–10.4)
Chloride: 104 mEq/L (ref 98–109)
Glucose: 143 mg/dl — ABNORMAL HIGH (ref 70–140)
Potassium: 3.9 mEq/L (ref 3.5–5.1)
Sodium: 141 mEq/L (ref 136–145)
Total Bilirubin: 0.62 mg/dL (ref 0.20–1.20)
Total Protein: 7.1 g/dL (ref 6.4–8.3)

## 2013-02-01 LAB — CBC WITH DIFFERENTIAL/PLATELET
Eosinophils Absolute: 0.1 10*3/uL (ref 0.0–0.5)
HCT: 30.5 % — ABNORMAL LOW (ref 38.4–49.9)
LYMPH%: 26.7 % (ref 14.0–49.0)
MCH: 32.5 pg (ref 27.2–33.4)
MONO#: 0.6 10*3/uL (ref 0.1–0.9)
NEUT#: 1.7 10*3/uL (ref 1.5–6.5)
Platelets: 159 10*3/uL (ref 140–400)
RBC: 3.12 10*6/uL — ABNORMAL LOW (ref 4.20–5.82)
RDW: 17.1 % — ABNORMAL HIGH (ref 11.0–14.6)
WBC: 3.3 10*3/uL — ABNORMAL LOW (ref 4.0–10.3)
lymph#: 0.9 10*3/uL (ref 0.9–3.3)

## 2013-02-01 MED ORDER — HEPARIN SOD (PORK) LOCK FLUSH 100 UNIT/ML IV SOLN
500.0000 [IU] | Freq: Once | INTRAVENOUS | Status: AC | PRN
  Administered 2013-02-01: 500 [IU]
  Filled 2013-02-01: qty 5

## 2013-02-01 MED ORDER — SODIUM CHLORIDE 0.9 % IJ SOLN
10.0000 mL | INTRAMUSCULAR | Status: DC | PRN
  Administered 2013-02-01: 10 mL
  Filled 2013-02-01: qty 10

## 2013-02-01 MED ORDER — DEXAMETHASONE SODIUM PHOSPHATE 10 MG/ML IJ SOLN
10.0000 mg | Freq: Once | INTRAMUSCULAR | Status: AC
  Administered 2013-02-01: 10 mg via INTRAVENOUS

## 2013-02-01 MED ORDER — ONDANSETRON 8 MG/50ML IVPB (CHCC)
8.0000 mg | Freq: Once | INTRAVENOUS | Status: AC
  Administered 2013-02-01: 8 mg via INTRAVENOUS

## 2013-02-01 MED ORDER — ONDANSETRON 8 MG/NS 50 ML IVPB
INTRAVENOUS | Status: AC
  Filled 2013-02-01: qty 8

## 2013-02-01 MED ORDER — PACLITAXEL PROTEIN-BOUND CHEMO INJECTION 100 MG
100.0000 mg/m2 | Freq: Once | INTRAVENOUS | Status: AC
  Administered 2013-02-01: 175 mg via INTRAVENOUS
  Filled 2013-02-01: qty 35

## 2013-02-01 MED ORDER — SODIUM CHLORIDE 0.9 % IV SOLN
Freq: Once | INTRAVENOUS | Status: AC
  Administered 2013-02-01: 16:00:00 via INTRAVENOUS

## 2013-02-01 MED ORDER — DEXAMETHASONE SODIUM PHOSPHATE 10 MG/ML IJ SOLN
INTRAMUSCULAR | Status: AC
  Filled 2013-02-01: qty 1

## 2013-02-01 NOTE — Patient Instructions (Signed)
[facility] Discharge Instructions for Patients Receiving Chemotherapy  Today you received the following chemotherapy agent: Abraxane   To help prevent nausea and vomiting after your treatment, we encourage you to take your nausea medication as prescribed. You received Zofran and Decadron in the infusion room.    If you develop nausea and vomiting that is not controlled by your nausea medication, call the clinic.   BELOW ARE SYMPTOMS THAT SHOULD BE REPORTED IMMEDIATELY:  *FEVER GREATER THAN 100.5 F  *CHILLS WITH OR WITHOUT FEVER  NAUSEA AND VOMITING THAT IS NOT CONTROLLED WITH YOUR NAUSEA MEDICATION  *UNUSUAL SHORTNESS OF BREATH  *UNUSUAL BRUISING OR BLEEDING  TENDERNESS IN MOUTH AND THROAT WITH OR WITHOUT PRESENCE OF ULCERS  *URINARY PROBLEMS  *BOWEL PROBLEMS  UNUSUAL RASH Items with * indicate a potential emergency and should be followed up as soon as possible.  Feel free to call the clinic you have any questions or concerns. The clinic phone number is [phone].

## 2013-02-01 NOTE — Progress Notes (Signed)
OFFICE PROGRESS NOTE  Interval history:  Randall [name] returns for follow up of metastatic pancreas cancer. He began treatment with Abraxane on 01/18/2013. He was not treated the week of Thanksgiving at his request.  He denies nausea/vomiting. No mouth sores. No change in bowel habits. He denies numbness or tingling in his hands or feet. No skin rash. Abdominal pain is better. He rarely takes pain medication. He is no longer having nosebleeds. He denies fever, cough and shortness of breath.   Objective: Blood pressure 104/60, pulse 66, temperature 97.2 F (36.2 C), temperature source Oral, resp. rate 20, height 5\' 7"  (1.702 m), weight 160 lb 9.6 oz (72.848 kg).  Oropharynx is without thrush or ulceration. Lungs are clear. Regular cardiac rhythm. Port-A-Cath site is without erythema. Abdomen soft and nontender. No mass. No organomegaly. No leg edema. Calves soft and nontender.  Lab Results: Lab Results  Component Value Date   WBC 3.3* 02/01/2013   HGB 10.1* 02/01/2013   HCT 30.5* 02/01/2013   MCV 97.8 02/01/2013   PLT 159 02/01/2013    Chemistry:    Chemistry      Component Value Date/Time   NA 141 02/01/2013 1347   NA 136 08/18/2012 0457   K 3.9 02/01/2013 1347   K 4.5 08/18/2012 0457   CL 102 08/18/2012 0457   CL 103 08/04/2012 0955   CO2 24 02/01/2013 1347   CO2 27 08/18/2012 0457   BUN 15.6 02/01/2013 1347   BUN 30* 08/18/2012 0457   CREATININE 0.9 02/01/2013 1347   CREATININE 0.73 08/18/2012 0457      Component Value Date/Time   CALCIUM 9.2 02/01/2013 1347   CALCIUM 8.9 08/18/2012 0457   ALKPHOS 181* 02/01/2013 1347   ALKPHOS 145* 08/15/2012 0351   AST 27 02/01/2013 1347   AST 58* 08/15/2012 0351   ALT 30 02/01/2013 1347   ALT 85* 08/15/2012 0351   BILITOT 0.62 02/01/2013 1347   BILITOT 0.4 08/15/2012 0351       Studies/Results: Ct Chest W Contrast  01/17/2013   CLINICAL DATA:  Pancreas cancer  EXAM: CT CHEST, ABDOMEN, AND PELVIS WITH CONTRAST  TECHNIQUE: Multidetector CT imaging of  the chest, abdomen and pelvis was performed following the standard protocol during bolus administration of intravenous contrast.  CONTRAST:  OMNIPAQUE IOHEXOL 300 MG/ML  SOLN  COMPARISON:  CT abdomen and pelvis from 09/29/2012 and CT chest from 08/11/2012  FINDINGS: CT CHEST FINDINGS  Previous 6 mm left lower lobe nodule has resolved in the interval. There are multiple new nodules identified scattered throughout both lungs. Index nodule in the right upper lobe measures 3 mm, image 25/8. Also in the right upper lobe is a 3 mm nodule, image 25/8. New right middle lobe nodule measures 3 mm,/series 8. There are multiple small (sub cm subpleural nodules identified in the right base and along the major fissure of the right lung.  The heart size is normal. No pericardial effusion. Stent is identified within the LAD coronary artery. Calcifications are noted involving the RCA and left circumflex coronary arteries. No mediastinal or hilar adenopathy.  No axillary or supraclavicular adenopathy. Review of the visualized osseous structures shows no aggressive lytic or sclerotic bone lesions.  CT ABDOMEN AND PELVIS FINDINGS  Multi focal liver metastasis are again noted. Lesion within the left hepatic lobe measures 2.3 cm, image 17/series 2. Previously 3.0 cm. Index lesion within the right hepatic lobe measure 2.6 cm, image 26/ series 2. Previously 1.6 cm. Adjacent liver lesion  measures 2.1 cm, image 26/ series 2. Previously this measured the same. Dome of liver lesion measures 2.1 cm, image number 17/series 2. Previously 1.7 cm.  The gallbladder contains multiple small calcifications. No biliary dilatation. Lesion within the head of pancreas measures 2.9 cm, image 48/ series 2. Previously 2.2 cm. Adjacent lymph node is stable measuring 2.5 cm, image 50/series 2. There is a enlarging aortocaval lymph node which now measures 1.8 cm, image 65/series 4. Previously 0.8 cm. The spleen measures 13.3 cm in length. Previously 12 cm.   The adrenal glands are both normal. Normal appearance of both kidneys. There is calcified atherosclerotic change involving the ectatic abdominal aorta. The infrarenal abdominal aorta measures up to the 2.9 cm in AP dimension.  Thrombus within the superior mesenteric vein extending into the portal venous confluence is again identified and appears similar to previous exam, image 63/series 4. Small clot within the left hepatic lobe branch of the portal vein is again noted, image 58/ series 4.  Review of the visualized osseous structures shows advanced changes of lumbar spondylosis. No aggressive lytic or sclerotic bone lesions.  IMPRESSION: 1. Although the dominant nodule in the left lower lobe has resolved in the interval, there are multiple new parenchymal and subpleural nodules identified within the right lung. These are nonspecific and may be related to metastatic disease. 2. The pancreatic head mass with regional adenopathy has progressed from previous exam. 3. Persistent multi focal liver metastasis. These demonstrate a mixed response compared with previous exam. Some lesions have decreased in the interval, some of remain stable, and others have increased in the interval. 4. Similar appearance of nonocclusive thrombus within the superior mesenteric vein and left portal vein.   Electronically Signed   By: Signa Kell M.D.   On: 01/17/2013 10:34   Ct Abdomen W Contrast  01/17/2013   CLINICAL DATA:  Pancreas cancer  EXAM: CT CHEST, ABDOMEN, AND PELVIS WITH CONTRAST  TECHNIQUE: Multidetector CT imaging of the chest, abdomen and pelvis was performed following the standard protocol during bolus administration of intravenous contrast.  CONTRAST:  OMNIPAQUE IOHEXOL 300 MG/ML  SOLN  COMPARISON:  CT abdomen and pelvis from 09/29/2012 and CT chest from 08/11/2012  FINDINGS: CT CHEST FINDINGS  Previous 6 mm left lower lobe nodule has resolved in the interval. There are multiple new nodules identified scattered  throughout both lungs. Index nodule in the right upper lobe measures 3 mm, image 25/8. Also in the right upper lobe is a 3 mm nodule, image 25/8. New right middle lobe nodule measures 3 mm,/series 8. There are multiple small (sub cm subpleural nodules identified in the right base and along the major fissure of the right lung.  The heart size is normal. No pericardial effusion. Stent is identified within the LAD coronary artery. Calcifications are noted involving the RCA and left circumflex coronary arteries. No mediastinal or hilar adenopathy.  No axillary or supraclavicular adenopathy. Review of the visualized osseous structures shows no aggressive lytic or sclerotic bone lesions.  CT ABDOMEN AND PELVIS FINDINGS  Multi focal liver metastasis are again noted. Lesion within the left hepatic lobe measures 2.3 cm, image 17/series 2. Previously 3.0 cm. Index lesion within the right hepatic lobe measure 2.6 cm, image 26/ series 2. Previously 1.6 cm. Adjacent liver lesion measures 2.1 cm, image 26/ series 2. Previously this measured the same. Dome of liver lesion measures 2.1 cm, image number 17/series 2. Previously 1.7 cm.  The gallbladder contains multiple small calcifications. No  biliary dilatation. Lesion within the head of pancreas measures 2.9 cm, image 48/ series 2. Previously 2.2 cm. Adjacent lymph node is stable measuring 2.5 cm, image 50/series 2. There is a enlarging aortocaval lymph node which now measures 1.8 cm, image 65/series 4. Previously 0.8 cm. The spleen measures 13.3 cm in length. Previously 12 cm.  The adrenal glands are both normal. Normal appearance of both kidneys. There is calcified atherosclerotic change involving the ectatic abdominal aorta. The infrarenal abdominal aorta measures up to the 2.9 cm in AP dimension.  Thrombus within the superior mesenteric vein extending into the portal venous confluence is again identified and appears similar to previous exam, image 63/series 4. Small clot  within the left hepatic lobe branch of the portal vein is again noted, image 58/ series 4.  Review of the visualized osseous structures shows advanced changes of lumbar spondylosis. No aggressive lytic or sclerotic bone lesions.  IMPRESSION: 1. Although the dominant nodule in the left lower lobe has resolved in the interval, there are multiple new parenchymal and subpleural nodules identified within the right lung. These are nonspecific and may be related to metastatic disease. 2. The pancreatic head mass with regional adenopathy has progressed from previous exam. 3. Persistent multi focal liver metastasis. These demonstrate a mixed response compared with previous exam. Some lesions have decreased in the interval, some of remain stable, and others have increased in the interval. 4. Similar appearance of nonocclusive thrombus within the superior mesenteric vein and left portal vein.   Electronically Signed   By: Signa Kell M.D.   On: 01/17/2013 10:34    Medications: I have reviewed the patient's current medications.  Assessment/Plan:  1. Metastatic pancreas cancer. The clinical presentation, elevated CA 19-9 and abdominal CT consistent with a diagnosis of metastatic pancreas cancer. 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 Initiation of gemcitabine/Abraxane on 07/21/2012. Last treated on 08/04/2012.  The CA 19-9 was improved on 09/01/2012 following 3 treatments with gemcitabine/Abraxane.  Restaging CT evaluation 09/29/2012 showed interval decrease in the left lower lobe pulmonary nodule; interval decrease in the size of the pancreatic head mass with decreasing regional lymphadenopathy and decreasing metastatic disease within the liver. No new or progressive disease noted.  CA 19-9 40775 on 07/07/2012, 1631 on 09/01/2012 and 2105 on 09/29/2012, ~2292 on 10/26/2012, ~ 5765 on 11/23/2012, ~5720 on 12/07/2012,  8383 on 01/04/2013.  Cycle 1 FOLFOX 10/12/2012, cycle 2 10/26/2012, cycle 3 11/23/2012, cycle 4 12/07/2012, cycle 5 12/21/2012, cycle 6 01/04/2013.  Restaging CT evaluation 01/17/2013 showed multiple new parenchymal and subpleural nodules, nonspecific; increased pancreatic head mass/regional adenopathy; mixed response in the liver. Initiation of Abraxane 01/18/2013. 2. Pain secondary to #1. Improved. 3. Acute myocardial infarction 06/16/2012 status post PTCA/drug-eluting stent, maintained on Brilinta. 4. Status post Port-A-Cath placement 07/16/2012. 5. Admission with high fever and chills on 08/11/2012. CT of the chest revealed multilobar infiltrates. He was diagnosed with chemotherapy induced pneumonitis. He improved with steroids. Chest x-ray 09/15/2012 showed continued improvement. 6. Mucositis following cycle 4 FOLFOX. The 5-FU was dose reduced beginning with cycle 5. 7. Thrombocytopenia secondary to chemotherapy. The oxaliplatin was dose reduced beginning with cycle 5 FOLFOX.  Disposition-he appears stable. Plan to proceed with Abraxane today as scheduled with plans for weekly x3 followed by a one-week break. We will obtain a repeat CA 19-9 on 02/07/2013. We will see him in followup on 02/15/2013. He will contact the office in the interim with any problems.  Plan reviewed with Dr. Benay Spice.  Ned Card ANP/GNP-BC

## 2013-02-01 NOTE — Telephone Encounter (Signed)
Gave pt appt for lab and MD on apptf for december ,emailed [place] regarding chemo

## 2013-02-02 ENCOUNTER — Encounter: Payer: Self-pay | Admitting: Nurse Practitioner

## 2013-02-03 ENCOUNTER — Telehealth: Payer: Self-pay | Admitting: Oncology

## 2013-02-03 ENCOUNTER — Encounter: Payer: Self-pay | Admitting: *Deleted

## 2013-02-03 ENCOUNTER — Telehealth: Payer: Self-pay | Admitting: *Deleted

## 2013-02-03 NOTE — Telephone Encounter (Signed)
Talked to pt and gave him all appts for december , lab,md and chemo

## 2013-02-03 NOTE — Telephone Encounter (Signed)
Advised scheduler regarding the  12/16 treatment appt, first available opening is 1245.

## 2013-02-03 NOTE — Telephone Encounter (Signed)
Per staff message and POF I have scheduled appts.  JMW  

## 2013-02-04 ENCOUNTER — Encounter: Payer: Self-pay | Admitting: Internal Medicine

## 2013-02-04 ENCOUNTER — Ambulatory Visit (INDEPENDENT_AMBULATORY_CARE_PROVIDER_SITE_OTHER): Payer: Medicare Other | Admitting: Internal Medicine

## 2013-02-04 VITALS — BP 110/62 | HR 72 | Temp 98.6°F | Resp 16 | Ht 67.0 in | Wt 162.0 lb

## 2013-02-04 DIAGNOSIS — D702 Other drug-induced agranulocytosis: Secondary | ICD-10-CM

## 2013-02-04 DIAGNOSIS — T50904A Poisoning by unspecified drugs, medicaments and biological substances, undetermined, initial encounter: Secondary | ICD-10-CM

## 2013-02-04 NOTE — Patient Instructions (Signed)
Risks of chemo discussed and prognosis

## 2013-02-04 NOTE — Progress Notes (Signed)
   Subjective:    Patient ID: Randall [name], male    DOB: 31-Aug-1940, 72 y.o.   MRN: [id number]  HPI Cancer treatment SOB COPD with pancreatic cancer   Review of Systems  Constitutional: Positive for fatigue. Negative for fever.  HENT: Negative for congestion, hearing loss and postnasal drip.   Eyes: Negative for discharge, redness and visual disturbance.  Respiratory: Negative for cough, shortness of breath and wheezing.   Cardiovascular: Negative for leg swelling.  Gastrointestinal: Positive for abdominal pain. Negative for constipation and abdominal distention.  Genitourinary: Negative for urgency and frequency.  Musculoskeletal: Negative for arthralgias, joint swelling and neck pain.  Skin: Negative for color change and rash.  Neurological: Positive for weakness. Negative for light-headedness.  Hematological: Negative for adenopathy.  Psychiatric/Behavioral: Negative for behavioral problems.       Objective:   Physical Exam  Constitutional: He appears well-developed and well-nourished.  HENT:  Head: Normocephalic and atraumatic.  Eyes: Conjunctivae are normal. Pupils are equal, round, and reactive to light.  Neck: Normal range of motion. Neck supple.  Cardiovascular: Normal rate and regular rhythm.   Pulmonary/Chest: Effort normal and breath sounds normal.  Abdominal: Soft. Bowel sounds are normal.          Assessment & Plan:  Poor prognosis based on rising CA 19-9  CBC showing moderate anemia Tolerating side effects of Chemo

## 2013-02-04 NOTE — Progress Notes (Signed)
Pre visit review using our clinic review tool, if applicable. No additional management support is needed unless otherwise documented below in the visit note. 

## 2013-02-05 ENCOUNTER — Other Ambulatory Visit: Payer: Self-pay | Admitting: Oncology

## 2013-02-07 ENCOUNTER — Other Ambulatory Visit (HOSPITAL_BASED_OUTPATIENT_CLINIC_OR_DEPARTMENT_OTHER): Payer: Medicare Other

## 2013-02-07 ENCOUNTER — Other Ambulatory Visit: Payer: Self-pay | Admitting: Nurse Practitioner

## 2013-02-07 ENCOUNTER — Ambulatory Visit (HOSPITAL_BASED_OUTPATIENT_CLINIC_OR_DEPARTMENT_OTHER): Payer: Medicare Other

## 2013-02-07 VITALS — BP 106/54 | HR 59 | Temp 97.9°F

## 2013-02-07 DIAGNOSIS — Z5111 Encounter for antineoplastic chemotherapy: Secondary | ICD-10-CM

## 2013-02-07 DIAGNOSIS — C250 Malignant neoplasm of head of pancreas: Secondary | ICD-10-CM

## 2013-02-07 DIAGNOSIS — C787 Secondary malignant neoplasm of liver and intrahepatic bile duct: Secondary | ICD-10-CM

## 2013-02-07 DIAGNOSIS — C259 Malignant neoplasm of pancreas, unspecified: Secondary | ICD-10-CM

## 2013-02-07 LAB — CBC WITH DIFFERENTIAL/PLATELET
BASO%: 0.7 % (ref 0.0–2.0)
EOS%: 1.5 % (ref 0.0–7.0)
Eosinophils Absolute: 0 10*3/uL (ref 0.0–0.5)
HCT: 28 % — ABNORMAL LOW (ref 38.4–49.9)
MCH: 32.9 pg (ref 27.2–33.4)
MCHC: 34.6 g/dL (ref 32.0–36.0)
MONO#: 0.3 10*3/uL (ref 0.1–0.9)
NEUT%: 67.1 % (ref 39.0–75.0)
RBC: 2.95 10*6/uL — ABNORMAL LOW (ref 4.20–5.82)
RDW: 16.6 % — ABNORMAL HIGH (ref 11.0–14.6)
WBC: 3 10*3/uL — ABNORMAL LOW (ref 4.0–10.3)
lymph#: 0.6 10*3/uL — ABNORMAL LOW (ref 0.9–3.3)

## 2013-02-07 MED ORDER — DEXAMETHASONE SODIUM PHOSPHATE 10 MG/ML IJ SOLN
10.0000 mg | Freq: Once | INTRAMUSCULAR | Status: AC
  Administered 2013-02-07: 10 mg via INTRAVENOUS

## 2013-02-07 MED ORDER — DEXAMETHASONE SODIUM PHOSPHATE 10 MG/ML IJ SOLN
INTRAMUSCULAR | Status: AC
  Filled 2013-02-07: qty 1

## 2013-02-07 MED ORDER — ONDANSETRON 8 MG/50ML IVPB (CHCC)
8.0000 mg | Freq: Once | INTRAVENOUS | Status: AC
  Administered 2013-02-07: 8 mg via INTRAVENOUS

## 2013-02-07 MED ORDER — HEPARIN SOD (PORK) LOCK FLUSH 100 UNIT/ML IV SOLN
500.0000 [IU] | Freq: Once | INTRAVENOUS | Status: AC | PRN
  Administered 2013-02-07: 500 [IU]
  Filled 2013-02-07: qty 5

## 2013-02-07 MED ORDER — HYDROCODONE-ACETAMINOPHEN 5-325 MG PO TABS: 1.0000 | ORAL_TABLET | Freq: Four times a day (QID) | ORAL | Status: DC | PRN

## 2013-02-07 MED ORDER — ONDANSETRON 8 MG/NS 50 ML IVPB
INTRAVENOUS | Status: AC
  Filled 2013-02-07: qty 8

## 2013-02-07 MED ORDER — PACLITAXEL PROTEIN-BOUND CHEMO INJECTION 100 MG
100.0000 mg/m2 | Freq: Once | INTRAVENOUS | Status: AC
  Administered 2013-02-07: 175 mg via INTRAVENOUS
  Filled 2013-02-07: qty 35

## 2013-02-07 MED ORDER — SODIUM CHLORIDE 0.9 % IV SOLN
Freq: Once | INTRAVENOUS | Status: AC
  Administered 2013-02-07: 09:00:00 via INTRAVENOUS

## 2013-02-07 MED ORDER — SODIUM CHLORIDE 0.9 % IJ SOLN
10.0000 mL | INTRAMUSCULAR | Status: DC | PRN
  Administered 2013-02-07: 10 mL
  Filled 2013-02-07: qty 10

## 2013-02-07 NOTE — Patient Instructions (Signed)
[facility] Discharge Instructions for Patients Receiving Chemotherapy  Today you received the following chemotherapy agents Abraxane  To help prevent nausea and vomiting after your treatment, we encourage you to take your nausea medication as prescribed.   If you develop nausea and vomiting that is not controlled by your nausea medication, call the clinic.   BELOW ARE SYMPTOMS THAT SHOULD BE REPORTED IMMEDIATELY:  *FEVER GREATER THAN 100.5 F  *CHILLS WITH OR WITHOUT FEVER  NAUSEA AND VOMITING THAT IS NOT CONTROLLED WITH YOUR NAUSEA MEDICATION  *UNUSUAL SHORTNESS OF BREATH  *UNUSUAL BRUISING OR BLEEDING  TENDERNESS IN MOUTH AND THROAT WITH OR WITHOUT PRESENCE OF ULCERS  *URINARY PROBLEMS  *BOWEL PROBLEMS  UNUSUAL RASH Items with * indicate a potential emergency and should be followed up as soon as possible.  Feel free to call the clinic you have any questions or concerns. The clinic phone number is [phone].    

## 2013-02-08 ENCOUNTER — Encounter: Payer: Self-pay | Admitting: Cardiovascular Disease

## 2013-02-08 ENCOUNTER — Ambulatory Visit (INDEPENDENT_AMBULATORY_CARE_PROVIDER_SITE_OTHER): Payer: Medicare Other | Admitting: Cardiovascular Disease

## 2013-02-08 VITALS — BP 112/60 | HR 58 | Ht 68.5 in | Wt 161.0 lb

## 2013-02-08 DIAGNOSIS — I2510 Atherosclerotic heart disease of native coronary artery without angina pectoris: Secondary | ICD-10-CM

## 2013-02-08 LAB — CANCER ANTIGEN 19-9: CA 19-9: 15242.1 U/mL — ABNORMAL HIGH (ref <35.0)

## 2013-02-08 NOTE — Patient Instructions (Signed)
Your physician has requested that you have an echocardiogram. Echocardiography is a painless test that uses sound waves to create images of your heart. It provides your doctor with information about the size and shape of your heart and how well your heart's chambers and valves are working. This procedure takes approximately one hour. There are no restrictions for this procedure.  Your physician wants you to follow-up in: 6 MONTHS with Dr Cooper.  You will receive a reminder letter in the mail two months in advance. If you don't receive a letter, please call our office to schedule the follow-up appointment.  Your physician recommends that you continue on your current medications as directed. Please refer to the Current Medication list given to you today.  

## 2013-02-08 NOTE — Progress Notes (Signed)
HPI:  72 year old gentleman presenting for followup evaluation. He is followed for coronary artery disease. He presented in April 2014 with an anterior wall MI treated with PCI of the LAD. A drug-eluting stent platform was used. He was noted to have moderately severe residual stenosis of the right coronary artery and this was managed with medical therapy. Unfortunately after his myocardial infarction he was diagnosed with metastatic pancreatic cancer. He's been managed with chemotherapy.  From a symptomatic perspective he is doing okay. He complains of dyspnea with exertion, but has no other cardiac complaints. He denies chest pain or pressure, leg swelling, palpitations, lightheadedness, or presyncope.  He is on his third different type of chemotherapy. He had a severe pulmonary reaction to gemcitabine. The next agent that was tried was ineffective. He is tolerating his current regimen well.  Outpatient Encounter Prescriptions as of 02/08/2013  Medication Sig  . acetaminophen (TYLENOL) 500 MG tablet Take 500-1,000 mg by mouth daily as needed for pain.  Marland Kitchen ALPRAZolam (XANAX) 0.5 MG tablet Take 1 tablet (0.5 mg total) by mouth 3 (three) times daily as needed for anxiety.  Marland Kitchen aspirin EC 81 MG tablet Take 81 mg by mouth daily.  Marland Kitchen atorvastatin (LIPITOR) 80 MG tablet Take 1 tablet (80 mg total) by mouth daily at 6 PM.  . carvedilol (COREG) 6.25 MG tablet Take 1 tablet (6.25 mg total) by mouth 2 (two) times daily with a meal.  . citalopram (CELEXA) 20 MG tablet Take 1 tablet (20 mg total) by mouth daily.  Marland Kitchen docusate sodium (COLACE) 100 MG capsule Take 100 mg by mouth as needed for constipation.  Marland Kitchen HYDROcodone-acetaminophen (NORCO/VICODIN) 5-325 MG per tablet Take 1 tablet by mouth every 6 (six) hours as needed.  . lidocaine-prilocaine (EMLA) cream Apply topically as needed. To PAC prn  . nitroGLYCERIN (NITROSTAT) 0.4 MG SL tablet Place 1 tablet (0.4 mg total) under the tongue every 5 (five) minutes x 3  doses as needed for chest pain.  Marland Kitchen prochlorperazine (COMPAZINE) 10 MG tablet Take 1 tablet (10 mg total) by mouth every 6 (six) hours as needed (nausea).  . ramipril (ALTACE) 1.25 MG capsule Take 1 capsule (1.25 mg total) by mouth daily.  . Ticagrelor (BRILINTA) 90 MG TABS tablet Take 1 tablet (90 mg total) by mouth 2 (two) times daily.  . [DISCONTINUED] Alum & Mag Hydroxide-Simeth (MAGIC MOUTHWASH W/LIDOCAINE) SOLN Swish and spit 5 - 10 mls by mouth every 4 hours as needed for mouth pain  . [DISCONTINUED] Ipratropium-Albuterol (COMBIVENT RESPIMAT) 20-100 MCG/ACT AERS respimat Inhale 1 puff into the lungs every 6 (six) hours.    Allergies  Allergen Reactions  . Gemzar [Gemcitabine] Shortness Of Breath    pneumonitis    Past Medical History  Diagnosis Date  . Anxiety   . Pancreatic cancer metastasized to liver   . MI (myocardial infarction)   . HTN (hypertension)   . Right rotator cuff tear   . Diverticulosis   . Malignant neoplasm of pancreas, part unspecified 07/18/2012    ROS: Negative except as per HPI  BP 112/60  Pulse 58  Ht 5' 8.5" (1.74 m)  Wt 161 lb (73.029 kg)  BMI 24.12 kg/m2  PHYSICAL EXAM: Pt is alert and oriented, NAD HEENT: normal Neck: JVP - normal, carotids 2+= without bruits Lungs: CTA bilaterally CV: RRR without murmur or gallop Abd: soft, NT, Positive BS Ext: no C/C/E, distal pulses intact and equal Skin: warm/dry no rash  EKG:  Sinus rhythm 58 beats  per minute, right bundle branch block, left ventricular hypertrophy. Cannot rule out age-indeterminate septal infarct.  ASSESSMENT AND PLAN: 1. Coronary artery disease status post anterior MI. The patient is within a 12 months of his acute infarction. His ejection fraction was initially 45%. Recommend an echocardiogram to reassess LVEF. Consider discontinuation of Altace if LV function has normalized. He should continue on dual antiplatelet therapy with aspirin and brilinta for at least 12 months. I would  consider stopping brilinta after 12 months and he should remain on aspirin indefinitely. He seems to be tolerating carvedilol well.  Note the patient does have residual coronary artery disease involving the right coronary artery. He has no angina whatsoever. Medical therapy is recommended as there is no indication for revascularization with the absence of angina.  2. Hyperlipidemia. He remains on high-dose atorvastatin. This is appropriate for secondary risk reduction especially in the first year after his acute myocardial infarction. Recent LFTs reviewed and they were within normal limits. Lipids from October 7 showed cholesterol 112, HDL 44, and LDL 53.  3. Ischemic cardiomyopathy. Discussion as above. Continue carvedilol. Reassess LV function. Consider discontinuation of his low dose ACE inhibitor depending on results of the echocardiogram.  Tonny Bollman 02/08/2013 6:40 PM

## 2013-02-15 ENCOUNTER — Other Ambulatory Visit (HOSPITAL_BASED_OUTPATIENT_CLINIC_OR_DEPARTMENT_OTHER): Payer: Medicare Other

## 2013-02-15 ENCOUNTER — Ambulatory Visit (HOSPITAL_BASED_OUTPATIENT_CLINIC_OR_DEPARTMENT_OTHER): Payer: Medicare Other | Admitting: Oncology

## 2013-02-15 ENCOUNTER — Telehealth: Payer: Self-pay | Admitting: Oncology

## 2013-02-15 ENCOUNTER — Ambulatory Visit: Payer: Medicare Other

## 2013-02-15 VITALS — BP 107/52 | HR 59 | Temp 97.1°F | Resp 18 | Ht 68.0 in | Wt 160.6 lb

## 2013-02-15 DIAGNOSIS — R040 Epistaxis: Secondary | ICD-10-CM

## 2013-02-15 DIAGNOSIS — D6959 Other secondary thrombocytopenia: Secondary | ICD-10-CM

## 2013-02-15 DIAGNOSIS — C259 Malignant neoplasm of pancreas, unspecified: Secondary | ICD-10-CM

## 2013-02-15 DIAGNOSIS — D702 Other drug-induced agranulocytosis: Secondary | ICD-10-CM

## 2013-02-15 DIAGNOSIS — C250 Malignant neoplasm of head of pancreas: Secondary | ICD-10-CM

## 2013-02-15 DIAGNOSIS — T451X5A Adverse effect of antineoplastic and immunosuppressive drugs, initial encounter: Secondary | ICD-10-CM

## 2013-02-15 DIAGNOSIS — C787 Secondary malignant neoplasm of liver and intrahepatic bile duct: Secondary | ICD-10-CM

## 2013-02-15 LAB — CBC WITH DIFFERENTIAL/PLATELET
BASO%: 0.5 % (ref 0.0–2.0)
Basophils Absolute: 0 10*3/uL (ref 0.0–0.1)
EOS%: 1.7 % (ref 0.0–7.0)
Eosinophils Absolute: 0 10*3/uL (ref 0.0–0.5)
HCT: 27.1 % — ABNORMAL LOW (ref 38.4–49.9)
HGB: 9.2 g/dL — ABNORMAL LOW (ref 13.0–17.1)
MCV: 96.8 fL (ref 79.3–98.0)
MONO%: 13.3 % (ref 0.0–14.0)
NEUT#: 1.1 10*3/uL — ABNORMAL LOW (ref 1.5–6.5)
NEUT%: 53.4 % (ref 39.0–75.0)
RBC: 2.8 10*6/uL — ABNORMAL LOW (ref 4.20–5.82)
RDW: 17.5 % — ABNORMAL HIGH (ref 11.0–14.6)
WBC: 2.1 10*3/uL — ABNORMAL LOW (ref 4.0–10.3)
lymph#: 0.7 10*3/uL — ABNORMAL LOW (ref 0.9–3.3)

## 2013-02-15 LAB — COMPREHENSIVE METABOLIC PANEL (CC13)
AST: 24 U/L (ref 5–34)
Albumin: 3.4 g/dL — ABNORMAL LOW (ref 3.5–5.0)
Alkaline Phosphatase: 149 U/L (ref 40–150)
BUN: 19.7 mg/dL (ref 7.0–26.0)
Calcium: 9.1 mg/dL (ref 8.4–10.4)
Chloride: 105 mEq/L (ref 98–109)
Creatinine: 0.9 mg/dL (ref 0.7–1.3)
Glucose: 115 mg/dl (ref 70–140)
Potassium: 4.4 mEq/L (ref 3.5–5.1)
Sodium: 138 mEq/L (ref 136–145)
Total Bilirubin: 0.56 mg/dL (ref 0.20–1.20)
Total Protein: 6.6 g/dL (ref 6.4–8.3)

## 2013-02-15 NOTE — Progress Notes (Signed)
[facility]    OFFICE PROGRESS NOTE   INTERVAL HISTORY:   Randall [name] returns for scheduled followup of pancreas cancer. He was last treated with Abraxane on 02/07/2013. No nausea. He reports an ulcer at the right side of the tongue. The fingertips are "smooth ". No significant numbness. He has occasional abdominal pain. He takes a hydrocodone tablet every few days. No chest pain or dyspnea. He complains of bleeding when he blows his nose.  Objective:  Vital signs in last 24 hours:  Blood pressure 107/52, pulse 59, temperature 97.1 F (36.2 C), temperature source Oral, resp. rate 18, height 5\' 8"  (1.727 m), weight 160 lb 9.6 oz (72.848 kg).    HEENT: No thrush or ulcers, erythema at the right nasal septum-no active bleeding Resp: Lungs clear bilaterally Cardio: Regular rate and rhythm GI: No hepatomegaly, nontender, no mass Vascular: No leg edema Neuro: Mild decrease in vibratory sense at the fingertips bilaterally  Skin: Herpetic lesion above the upper lip   Portacath/PICC-without erythema  Lab Results:  Lab Results  Component Value Date   WBC 2.1* 02/15/2013   HGB 9.2* 02/15/2013   HCT 27.1* 02/15/2013   MCV 96.8 02/15/2013   PLT 144 02/15/2013   ANC 1.1  CA 19-9 On 02/07/2013-15,242 Medications: I have reviewed the patient's current medications.  Assessment/Plan: 1. Metastatic pancreas cancer. The clinical presentation, elevated CA 19-9 and abdominal CT consistent with a diagnosis of metastatic pancreas cancer. 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 Initiation of gemcitabine/Abraxane on 07/21/2012. Last treated on 08/04/2012.  The CA 19-9 was improved on 09/01/2012 following 3 treatments with gemcitabine/Abraxane.  Restaging CT evaluation 09/29/2012 showed interval decrease in the left lower lobe pulmonary nodule; interval decrease in the size of the  pancreatic head mass with decreasing regional lymphadenopathy and decreasing metastatic disease within the liver. No new or progressive disease noted.  CA 19-9 40775 on 07/07/2012, 1631 on 09/01/2012 and 2105 on 09/29/2012, ~2292 on 10/26/2012, ~ 5765 on 11/23/2012, ~5720 on 12/07/2012, 8383 on 01/04/2013.  Cycle 1 FOLFOX 10/12/2012, cycle 2 10/26/2012, cycle 3 11/23/2012, cycle 4 12/07/2012, cycle 5 12/21/2012, cycle 6 01/04/2013.  Restaging CT evaluation 01/17/2013 showed multiple new parenchymal and subpleural nodules, nonspecific; increased pancreatic head mass/regional adenopathy; mixed response in the liver.  Initiation of Abraxane 01/18/2013. 2. Pain secondary to #1. Improved. 3. Acute myocardial infarction 06/16/2012 status post PTCA/drug-eluting stent, maintained on Brilinta. 4. Status post Port-A-Cath placement 07/16/2012. 5. Admission with high fever and chills on 08/11/2012. CT of the chest revealed multilobar infiltrates. He was diagnosed with chemotherapy induced pneumonitis. He improved with steroids. Chest x-ray 09/15/2012 showed continued improvement. 6. Mucositis following cycle 4 FOLFOX. The 5-FU was dose reduced beginning with cycle 5. 7. Thrombocytopenia secondary to chemotherapy. The oxaliplatin was dose reduced beginning with cycle 5 FOLFOX. 8. Neutropenia secondary to chemotherapy-chemotherapy will be held today 9. Intermittent nose bleeding-likely related to mucositis from chemotherapy and aspirin/brilinta   Disposition:  Randall [name] appears stable. The CA 19-9 was higher after 2 treatments with single agent Abraxane. He has now completed 3 treatments. We will repeat the CA 19-9 with chemotherapy on 02/17/2013. Chemotherapy will be held today secondary to neutropenia.  Randall [name] will leave town for the holidays on 02/21/2013 and return on 03/01/2013. He is scheduled for the next treatment with Abraxane on 03/07/2013. He will return for an office visit on  03/14/2013.  We will refer him for  a restaging CT evaluation if the CA 19-9 is higher on 03/07/2013.   Thornton Papas, MD  02/15/2013  1:56 PM

## 2013-02-15 NOTE — Telephone Encounter (Signed)
gv pt appt schedule for january °

## 2013-02-15 NOTE — Progress Notes (Signed)
Treatment rescheduled for Thursday due to abnormal labs per Dr. Truett Perna.

## 2013-02-16 ENCOUNTER — Telehealth: Payer: Self-pay | Admitting: Oncology

## 2013-02-16 NOTE — Telephone Encounter (Signed)
per BS and desk nurse disregard lb/tx for 12/19 per 12/16 pof tx may remain 12/18.

## 2013-02-17 ENCOUNTER — Other Ambulatory Visit (HOSPITAL_BASED_OUTPATIENT_CLINIC_OR_DEPARTMENT_OTHER): Payer: Medicare Other

## 2013-02-17 ENCOUNTER — Ambulatory Visit (HOSPITAL_BASED_OUTPATIENT_CLINIC_OR_DEPARTMENT_OTHER): Payer: Medicare Other

## 2013-02-17 ENCOUNTER — Other Ambulatory Visit: Payer: Self-pay | Admitting: Oncology

## 2013-02-17 VITALS — BP 110/55 | HR 51 | Temp 97.5°F

## 2013-02-17 DIAGNOSIS — C250 Malignant neoplasm of head of pancreas: Secondary | ICD-10-CM

## 2013-02-17 DIAGNOSIS — C259 Malignant neoplasm of pancreas, unspecified: Secondary | ICD-10-CM

## 2013-02-17 DIAGNOSIS — C787 Secondary malignant neoplasm of liver and intrahepatic bile duct: Secondary | ICD-10-CM

## 2013-02-17 DIAGNOSIS — Z5111 Encounter for antineoplastic chemotherapy: Secondary | ICD-10-CM

## 2013-02-17 LAB — CANCER ANTIGEN 19-9: CA 19-9: 5536 U/mL — ABNORMAL HIGH (ref <35.0)

## 2013-02-17 LAB — CBC WITH DIFFERENTIAL/PLATELET
BASO%: 0.4 % (ref 0.0–2.0)
Basophils Absolute: 0 10*3/uL (ref 0.0–0.1)
EOS%: 1.4 % (ref 0.0–7.0)
Eosinophils Absolute: 0 10*3/uL (ref 0.0–0.5)
HCT: 26.9 % — ABNORMAL LOW (ref 38.4–49.9)
HGB: 8.8 g/dL — ABNORMAL LOW (ref 13.0–17.1)
LYMPH%: 26.5 % (ref 14.0–49.0)
MCH: 32.2 pg (ref 27.2–33.4)
MCHC: 32.9 g/dL (ref 32.0–36.0)
MCV: 97.9 fL (ref 79.3–98.0)
MONO#: 0.4 10*3/uL (ref 0.1–0.9)
MONO%: 18.4 % — ABNORMAL HIGH (ref 0.0–14.0)
NEUT#: 1.2 10*3/uL — ABNORMAL LOW (ref 1.5–6.5)
NEUT%: 53.3 % (ref 39.0–75.0)
Platelets: 133 10*3/uL — ABNORMAL LOW (ref 140–400)
RBC: 2.75 10*6/uL — ABNORMAL LOW (ref 4.20–5.82)
RDW: 17.5 % — ABNORMAL HIGH (ref 11.0–14.6)
WBC: 2.3 10*3/uL — ABNORMAL LOW (ref 4.0–10.3)
lymph#: 0.6 10*3/uL — ABNORMAL LOW (ref 0.9–3.3)

## 2013-02-17 MED ORDER — LORATADINE 10 MG PO TABS: 10.0000 mg | ORAL_TABLET | Freq: Every day | ORAL | Status: DC

## 2013-02-17 MED ORDER — DEXAMETHASONE SODIUM PHOSPHATE 10 MG/ML IJ SOLN
10.0000 mg | Freq: Once | INTRAMUSCULAR | Status: AC
  Administered 2013-02-17: 10 mg via INTRAVENOUS

## 2013-02-17 MED ORDER — PACLITAXEL PROTEIN-BOUND CHEMO INJECTION 100 MG
100.0000 mg/m2 | Freq: Once | INTRAVENOUS | Status: AC
  Administered 2013-02-17: 175 mg via INTRAVENOUS
  Filled 2013-02-17: qty 35

## 2013-02-17 MED ORDER — DEXAMETHASONE SODIUM PHOSPHATE 10 MG/ML IJ SOLN
INTRAMUSCULAR | Status: AC
  Filled 2013-02-17: qty 1

## 2013-02-17 MED ORDER — HEPARIN SOD (PORK) LOCK FLUSH 100 UNIT/ML IV SOLN
500.0000 [IU] | Freq: Once | INTRAVENOUS | Status: AC | PRN
  Administered 2013-02-17: 500 [IU]
  Filled 2013-02-17: qty 5

## 2013-02-17 MED ORDER — SODIUM CHLORIDE 0.9 % IJ SOLN
10.0000 mL | INTRAMUSCULAR | Status: DC | PRN
  Administered 2013-02-17: 10 mL
  Filled 2013-02-17: qty 10

## 2013-02-17 MED ORDER — SODIUM CHLORIDE 0.9 % IV SOLN
Freq: Once | INTRAVENOUS | Status: AC
  Administered 2013-02-17: 09:00:00 via INTRAVENOUS

## 2013-02-17 MED ORDER — ONDANSETRON 8 MG/NS 50 ML IVPB
INTRAVENOUS | Status: AC
  Filled 2013-02-17: qty 8

## 2013-02-17 MED ORDER — ONDANSETRON 8 MG/50ML IVPB (CHCC)
8.0000 mg | Freq: Once | INTRAVENOUS | Status: AC
  Administered 2013-02-17: 8 mg via INTRAVENOUS

## 2013-02-17 NOTE — Patient Instructions (Signed)
[facility] Discharge Instructions for Patients Receiving Chemotherapy  Today you received the following chemotherapy agent: Abraxane To help prevent nausea and vomiting after your treatment, we encourage you to take your nausea medications as needed:  Compazine 10 mg every 6 hours as needed   If you develop nausea and vomiting that is not controlled by your nausea medication, call the clinic.   Return to clinic 12/19 for Neulasta injection (see handout)-may take claritin 10 mg daily x 4-5 days to help ease bone pain from injection  BELOW ARE SYMPTOMS THAT SHOULD BE REPORTED IMMEDIATELY:  *FEVER GREATER THAN 100.5 F  *CHILLS WITH OR WITHOUT FEVER  NAUSEA AND VOMITING THAT IS NOT CONTROLLED WITH YOUR NAUSEA MEDICATION  *UNUSUAL SHORTNESS OF BREATH  *UNUSUAL BRUISING OR BLEEDING  TENDERNESS IN MOUTH AND THROAT WITH OR WITHOUT PRESENCE OF ULCERS  *URINARY PROBLEMS  *BOWEL PROBLEMS  UNUSUAL RASH Items with * indicate a potential emergency and should be followed up as soon as possible.  Feel free to call the clinic should you have any questions or concerns. The clinic phone number is [phone].  It has been a pleasure to serve you today!

## 2013-02-17 NOTE — Progress Notes (Signed)
Spoke with MD regarding counts today. OK to treat today if patient agrees to Neulasta injection tomorrow. Patient agrees.

## 2013-02-18 ENCOUNTER — Ambulatory Visit (HOSPITAL_BASED_OUTPATIENT_CLINIC_OR_DEPARTMENT_OTHER): Payer: Medicare Other

## 2013-02-18 VITALS — BP 121/54 | HR 67 | Temp 98.0°F

## 2013-02-18 DIAGNOSIS — C787 Secondary malignant neoplasm of liver and intrahepatic bile duct: Secondary | ICD-10-CM

## 2013-02-18 DIAGNOSIS — C259 Malignant neoplasm of pancreas, unspecified: Secondary | ICD-10-CM

## 2013-02-18 DIAGNOSIS — C250 Malignant neoplasm of head of pancreas: Secondary | ICD-10-CM

## 2013-02-18 DIAGNOSIS — Z5189 Encounter for other specified aftercare: Secondary | ICD-10-CM

## 2013-02-18 MED ORDER — PEGFILGRASTIM INJECTION 6 MG/0.6ML
6.0000 mg | Freq: Once | SUBCUTANEOUS | Status: AC
  Administered 2013-02-18: 6 mg via SUBCUTANEOUS
  Filled 2013-02-18: qty 0.6

## 2013-02-18 NOTE — Patient Instructions (Signed)

## 2013-03-02 ENCOUNTER — Encounter: Payer: Self-pay | Admitting: Nurse Practitioner

## 2013-03-02 ENCOUNTER — Ambulatory Visit (HOSPITAL_COMMUNITY): Payer: Medicare Other | Attending: Cardiology | Admitting: Cardiology

## 2013-03-02 ENCOUNTER — Encounter: Payer: Self-pay | Admitting: Cardiology

## 2013-03-02 DIAGNOSIS — I2589 Other forms of chronic ischemic heart disease: Secondary | ICD-10-CM | POA: Insufficient documentation

## 2013-03-02 DIAGNOSIS — I2510 Atherosclerotic heart disease of native coronary artery without angina pectoris: Secondary | ICD-10-CM

## 2013-03-02 DIAGNOSIS — I252 Old myocardial infarction: Secondary | ICD-10-CM | POA: Insufficient documentation

## 2013-03-02 DIAGNOSIS — E785 Hyperlipidemia, unspecified: Secondary | ICD-10-CM | POA: Insufficient documentation

## 2013-03-02 DIAGNOSIS — I079 Rheumatic tricuspid valve disease, unspecified: Secondary | ICD-10-CM | POA: Insufficient documentation

## 2013-03-02 DIAGNOSIS — I059 Rheumatic mitral valve disease, unspecified: Secondary | ICD-10-CM | POA: Insufficient documentation

## 2013-03-02 DIAGNOSIS — I10 Essential (primary) hypertension: Secondary | ICD-10-CM | POA: Insufficient documentation

## 2013-03-02 DIAGNOSIS — C259 Malignant neoplasm of pancreas, unspecified: Secondary | ICD-10-CM | POA: Insufficient documentation

## 2013-03-02 NOTE — Progress Notes (Signed)
Echo performed. 

## 2013-03-03 ENCOUNTER — Other Ambulatory Visit: Payer: Self-pay | Admitting: Oncology

## 2013-03-07 ENCOUNTER — Ambulatory Visit (HOSPITAL_BASED_OUTPATIENT_CLINIC_OR_DEPARTMENT_OTHER): Payer: Medicare Other

## 2013-03-07 ENCOUNTER — Other Ambulatory Visit (HOSPITAL_BASED_OUTPATIENT_CLINIC_OR_DEPARTMENT_OTHER): Payer: Medicare Other

## 2013-03-07 VITALS — BP 106/57 | HR 57 | Temp 98.6°F | Resp 18

## 2013-03-07 DIAGNOSIS — Z5111 Encounter for antineoplastic chemotherapy: Secondary | ICD-10-CM

## 2013-03-07 DIAGNOSIS — C259 Malignant neoplasm of pancreas, unspecified: Secondary | ICD-10-CM

## 2013-03-07 DIAGNOSIS — C250 Malignant neoplasm of head of pancreas: Secondary | ICD-10-CM

## 2013-03-07 DIAGNOSIS — C787 Secondary malignant neoplasm of liver and intrahepatic bile duct: Secondary | ICD-10-CM

## 2013-03-07 LAB — CBC WITH DIFFERENTIAL/PLATELET
BASO%: 0.3 % (ref 0.0–2.0)
Basophils Absolute: 0 10*3/uL (ref 0.0–0.1)
EOS%: 2.1 % (ref 0.0–7.0)
Eosinophils Absolute: 0.1 10*3/uL (ref 0.0–0.5)
HEMATOCRIT: 28.6 % — AB (ref 38.4–49.9)
HGB: 9.5 g/dL — ABNORMAL LOW (ref 13.0–17.1)
LYMPH%: 13.4 % — AB (ref 14.0–49.0)
MCH: 32.8 pg (ref 27.2–33.4)
MCHC: 33.2 g/dL (ref 32.0–36.0)
MCV: 98.8 fL — ABNORMAL HIGH (ref 79.3–98.0)
MONO#: 0.8 10*3/uL (ref 0.1–0.9)
MONO%: 14.5 % — ABNORMAL HIGH (ref 0.0–14.0)
NEUT#: 4 10*3/uL (ref 1.5–6.5)
NEUT%: 69.7 % (ref 39.0–75.0)
PLATELETS: 112 10*3/uL — AB (ref 140–400)
RBC: 2.89 10*6/uL — ABNORMAL LOW (ref 4.20–5.82)
RDW: 18.2 % — ABNORMAL HIGH (ref 11.0–14.6)
WBC: 5.8 10*3/uL (ref 4.0–10.3)
lymph#: 0.8 10*3/uL — ABNORMAL LOW (ref 0.9–3.3)

## 2013-03-07 LAB — CANCER ANTIGEN 19-9: CA 19-9: 6699.2 U/mL — ABNORMAL HIGH (ref <35.0)

## 2013-03-07 MED ORDER — HEPARIN SOD (PORK) LOCK FLUSH 100 UNIT/ML IV SOLN
500.0000 [IU] | Freq: Once | INTRAVENOUS | Status: AC | PRN
  Administered 2013-03-07: 500 [IU]
  Filled 2013-03-07: qty 5

## 2013-03-07 MED ORDER — PACLITAXEL PROTEIN-BOUND CHEMO INJECTION 100 MG
100.0000 mg/m2 | Freq: Once | INTRAVENOUS | Status: AC
  Administered 2013-03-07: 175 mg via INTRAVENOUS
  Filled 2013-03-07: qty 35

## 2013-03-07 MED ORDER — ONDANSETRON 8 MG/50ML IVPB (CHCC)
8.0000 mg | Freq: Once | INTRAVENOUS | Status: AC
  Administered 2013-03-07: 8 mg via INTRAVENOUS

## 2013-03-07 MED ORDER — DEXAMETHASONE SODIUM PHOSPHATE 10 MG/ML IJ SOLN
10.0000 mg | Freq: Once | INTRAMUSCULAR | Status: AC
  Administered 2013-03-07: 10 mg via INTRAVENOUS

## 2013-03-07 MED ORDER — SODIUM CHLORIDE 0.9 % IJ SOLN
10.0000 mL | INTRAMUSCULAR | Status: DC | PRN
  Administered 2013-03-07: 10 mL
  Filled 2013-03-07: qty 10

## 2013-03-07 MED ORDER — SODIUM CHLORIDE 0.9 % IV SOLN
Freq: Once | INTRAVENOUS | Status: AC
  Administered 2013-03-07: 09:00:00 via INTRAVENOUS

## 2013-03-07 MED ORDER — ONDANSETRON 8 MG/NS 50 ML IVPB
INTRAVENOUS | Status: AC
  Filled 2013-03-07: qty 8

## 2013-03-07 MED ORDER — DEXAMETHASONE SODIUM PHOSPHATE 10 MG/ML IJ SOLN
INTRAMUSCULAR | Status: AC
  Filled 2013-03-07: qty 1

## 2013-03-07 NOTE — Patient Instructions (Signed)
[facility] Discharge Instructions for Patients Receiving Chemotherapy  Today you received the following chemotherapy agent: Abraxane   To help prevent nausea and vomiting after your treatment, we encourage you to take your nausea medication as prescribed.    If you develop nausea and vomiting that is not controlled by your nausea medication, call the clinic.   BELOW ARE SYMPTOMS THAT SHOULD BE REPORTED IMMEDIATELY:  *FEVER GREATER THAN 100.5 F  *CHILLS WITH OR WITHOUT FEVER  NAUSEA AND VOMITING THAT IS NOT CONTROLLED WITH YOUR NAUSEA MEDICATION  *UNUSUAL SHORTNESS OF BREATH  *UNUSUAL BRUISING OR BLEEDING  TENDERNESS IN MOUTH AND THROAT WITH OR WITHOUT PRESENCE OF ULCERS  *URINARY PROBLEMS  *BOWEL PROBLEMS  UNUSUAL RASH Items with * indicate a potential emergency and should be followed up as soon as possible.  Feel free to call the clinic you have any questions or concerns. The clinic phone number is [phone].    

## 2013-03-07 NOTE — Progress Notes (Signed)
Patient notes neuropathies in hands and feet, intermittent and mild. Patient denies pain but notes it is "annoying and weird." patient remains able to perform ADL's such as dressing and cooking. He does note balance changes on rare occasion but has not had a fall episode and does not feel unsafe walking independently. No changes in his gait. Ned Card, NP notified. Per NP, proceed with scheduled chemo today per orders; patient to be seen by provider on next visit. It has been explained to the patient that dose changes may be required but that will be determined at a later time per physical exam and doctor/NP recommendations. Patient verbalizes understanding.

## 2013-03-10 ENCOUNTER — Other Ambulatory Visit: Payer: Self-pay

## 2013-03-10 NOTE — Telephone Encounter (Signed)
Patient call for samples of birilinta put samples up front

## 2013-03-13 ENCOUNTER — Other Ambulatory Visit: Payer: Self-pay | Admitting: Oncology

## 2013-03-14 ENCOUNTER — Telehealth: Payer: Self-pay | Admitting: *Deleted

## 2013-03-14 ENCOUNTER — Telehealth: Payer: Self-pay | Admitting: Oncology

## 2013-03-14 ENCOUNTER — Ambulatory Visit (HOSPITAL_BASED_OUTPATIENT_CLINIC_OR_DEPARTMENT_OTHER): Payer: Medicare Other | Admitting: Oncology

## 2013-03-14 ENCOUNTER — Other Ambulatory Visit: Payer: Medicare Other

## 2013-03-14 ENCOUNTER — Ambulatory Visit (HOSPITAL_BASED_OUTPATIENT_CLINIC_OR_DEPARTMENT_OTHER): Payer: Medicare Other

## 2013-03-14 VITALS — BP 102/55 | HR 70 | Temp 97.7°F | Resp 20 | Ht 68.0 in | Wt 160.1 lb

## 2013-03-14 DIAGNOSIS — G893 Neoplasm related pain (acute) (chronic): Secondary | ICD-10-CM

## 2013-03-14 DIAGNOSIS — C787 Secondary malignant neoplasm of liver and intrahepatic bile duct: Secondary | ICD-10-CM

## 2013-03-14 DIAGNOSIS — C250 Malignant neoplasm of head of pancreas: Secondary | ICD-10-CM

## 2013-03-14 DIAGNOSIS — Z5111 Encounter for antineoplastic chemotherapy: Secondary | ICD-10-CM

## 2013-03-14 DIAGNOSIS — D6959 Other secondary thrombocytopenia: Secondary | ICD-10-CM

## 2013-03-14 DIAGNOSIS — C259 Malignant neoplasm of pancreas, unspecified: Secondary | ICD-10-CM

## 2013-03-14 DIAGNOSIS — G609 Hereditary and idiopathic neuropathy, unspecified: Secondary | ICD-10-CM

## 2013-03-14 LAB — CBC WITH DIFFERENTIAL/PLATELET
BASO%: 0.4 % (ref 0.0–2.0)
Basophils Absolute: 0 10*3/uL (ref 0.0–0.1)
EOS ABS: 0.2 10*3/uL (ref 0.0–0.5)
EOS%: 4.4 % (ref 0.0–7.0)
HCT: 27.3 % — ABNORMAL LOW (ref 38.4–49.9)
HGB: 9.2 g/dL — ABNORMAL LOW (ref 13.0–17.1)
LYMPH%: 15.4 % (ref 14.0–49.0)
MCH: 33.3 pg (ref 27.2–33.4)
MCHC: 33.9 g/dL (ref 32.0–36.0)
MCV: 98.2 fL — ABNORMAL HIGH (ref 79.3–98.0)
MONO#: 0.3 10*3/uL (ref 0.1–0.9)
MONO%: 8.2 % (ref 0.0–14.0)
NEUT#: 2.7 10*3/uL (ref 1.5–6.5)
NEUT%: 71.6 % (ref 39.0–75.0)
PLATELETS: 111 10*3/uL — AB (ref 140–400)
RBC: 2.78 10*6/uL — AB (ref 4.20–5.82)
RDW: 17.4 % — ABNORMAL HIGH (ref 11.0–14.6)
WBC: 3.8 10*3/uL — ABNORMAL LOW (ref 4.0–10.3)
lymph#: 0.6 10*3/uL — ABNORMAL LOW (ref 0.9–3.3)

## 2013-03-14 LAB — COMPREHENSIVE METABOLIC PANEL (CC13)
ALT: 69 U/L — AB (ref 0–55)
ANION GAP: 10 meq/L (ref 3–11)
AST: 44 U/L — ABNORMAL HIGH (ref 5–34)
Albumin: 3.5 g/dL (ref 3.5–5.0)
Alkaline Phosphatase: 182 U/L — ABNORMAL HIGH (ref 40–150)
BILIRUBIN TOTAL: 0.66 mg/dL (ref 0.20–1.20)
BUN: 17.4 mg/dL (ref 7.0–26.0)
CO2: 23 meq/L (ref 22–29)
CREATININE: 0.9 mg/dL (ref 0.7–1.3)
Calcium: 9.1 mg/dL (ref 8.4–10.4)
Chloride: 106 mEq/L (ref 98–109)
Glucose: 130 mg/dl (ref 70–140)
Potassium: 4.7 mEq/L (ref 3.5–5.1)
Sodium: 139 mEq/L (ref 136–145)
Total Protein: 6.7 g/dL (ref 6.4–8.3)

## 2013-03-14 LAB — CANCER ANTIGEN 19-9: CA 19 9: 9667 U/mL — AB (ref <35.0)

## 2013-03-14 MED ORDER — ONDANSETRON 8 MG/NS 50 ML IVPB
INTRAVENOUS | Status: AC
  Filled 2013-03-14: qty 8

## 2013-03-14 MED ORDER — DEXAMETHASONE SODIUM PHOSPHATE 10 MG/ML IJ SOLN
10.0000 mg | Freq: Once | INTRAMUSCULAR | Status: AC
  Administered 2013-03-14: 10 mg via INTRAVENOUS

## 2013-03-14 MED ORDER — SODIUM CHLORIDE 0.9 % IJ SOLN
10.0000 mL | INTRAMUSCULAR | Status: DC | PRN
  Administered 2013-03-14: 10 mL
  Filled 2013-03-14: qty 10

## 2013-03-14 MED ORDER — ONDANSETRON 8 MG/50ML IVPB (CHCC)
8.0000 mg | Freq: Once | INTRAVENOUS | Status: AC
  Administered 2013-03-14: 8 mg via INTRAVENOUS

## 2013-03-14 MED ORDER — SODIUM CHLORIDE 0.9 % IV SOLN
Freq: Once | INTRAVENOUS | Status: AC
  Administered 2013-03-14: 10:00:00 via INTRAVENOUS

## 2013-03-14 MED ORDER — DEXAMETHASONE SODIUM PHOSPHATE 10 MG/ML IJ SOLN
INTRAMUSCULAR | Status: AC
  Filled 2013-03-14: qty 1

## 2013-03-14 MED ORDER — HEPARIN SOD (PORK) LOCK FLUSH 100 UNIT/ML IV SOLN
500.0000 [IU] | Freq: Once | INTRAVENOUS | Status: AC | PRN
  Administered 2013-03-14: 500 [IU]
  Filled 2013-03-14: qty 5

## 2013-03-14 MED ORDER — PACLITAXEL PROTEIN-BOUND CHEMO INJECTION 100 MG
100.0000 mg/m2 | Freq: Once | INTRAVENOUS | Status: AC
  Administered 2013-03-14: 175 mg via INTRAVENOUS
  Filled 2013-03-14: qty 35

## 2013-03-14 NOTE — Telephone Encounter (Signed)
Per staff message and POF I have scheduled appts.  JMW  

## 2013-03-14 NOTE — Progress Notes (Signed)
[place]    OFFICE PROGRESS NOTE   INTERVAL HISTORY:   He returns as scheduled. He has noted an increase in abdominal pain. He now takes hydrocodone once daily. He was last treated with Abraxane on 03/07/2013. Randall [name] has noted increased numbness in the fingers and toes. This does not interfere with activity. The finger numbness is worse when his arms or hanging down. He recently returned from a trip to the [facility]. No dyspnea or chest pain.  Objective:  Vital signs in last 24 hours:  Blood pressure 102/55, pulse 70, temperature 97.7 F (36.5 C), temperature source Oral, resp. rate 20, height 5\' 8"  (1.727 m), weight 160 lb 1.6 oz (72.621 kg), SpO2 100.00%.    HEENT: No thrush or ulcers Resp: Lungs clear bilaterally Cardio: Regular rate and rhythm GI: No hepatosplenomegaly, nontender, no mass, no apparent ascites Vascular: No leg edema Neuro: The vibratory sense is intact at the fingertips    Portacath/PICC-without erythema  Lab Results:  Lab Results  Component Value Date   WBC 3.8* 03/14/2013   HGB 9.2* 03/14/2013   HCT 27.3* 03/14/2013   MCV 98.2* 03/14/2013   PLT 111* 03/14/2013   NEUTROABS 2.7 03/14/2013   CA 19-9 on 03/07/2013: 6699   Medications: I have reviewed the patient's current medications.  Assessment/Plan: 1. Metastatic pancreas cancer. The clinical presentation, elevated CA 19-9 and abdominal CT consistent with a diagnosis of metastatic pancreas cancer. 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 Initiation of gemcitabine/Abraxane on 07/21/2012. Last treated on 08/04/2012.  The CA 19-9 was improved on 09/01/2012 following 3 treatments with gemcitabine/Abraxane.  Restaging CT evaluation 09/29/2012 showed interval decrease in the left lower lobe pulmonary nodule; interval decrease in the size of the pancreatic head mass with decreasing regional  lymphadenopathy and decreasing metastatic disease within the liver. No new or progressive disease noted.  CA 19-9 40775 on 07/07/2012, 1631 on 09/01/2012 and 2105 on 09/29/2012, ~2292 on 10/26/2012, ~ 5765 on 11/23/2012, ~5720 on 12/07/2012, 8383 on 01/04/2013.  Cycle 1 FOLFOX 10/12/2012, cycle 2 10/26/2012, cycle 3 11/23/2012, cycle 4 12/07/2012, cycle 5 12/21/2012, cycle 6 01/04/2013.  Restaging CT evaluation 01/17/2013 showed multiple new parenchymal and subpleural nodules, nonspecific; increased pancreatic head mass/regional adenopathy; mixed response in the liver.  Initiation of Abraxane 01/18/2013. 2. Pain secondary to #1. Mild progression of the past month 3. Acute myocardial infarction 06/16/2012 status post PTCA/drug-eluting stent, maintained on Brilinta. 4. Status post Port-A-Cath placement 07/16/2012. 5. Admission with high fever and chills on 08/11/2012. CT of the chest revealed multilobar infiltrates. He was diagnosed with chemotherapy induced pneumonitis. He improved with steroids. Chest x-ray 09/15/2012 showed continued improvement. 6. Mucositis following cycle 4 FOLFOX. The 5-FU was dose reduced beginning with cycle 5. 7. Thrombocytopenia secondary to chemotherapy. The oxaliplatin was dose reduced beginning with cycle 5 FOLFOX. 8. History of Neutropenia secondary to chemotherapy 9. Intermittent nose bleeding-likely related to mucositis from chemotherapy and aspirin/brilinta 10. 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 His overall status is stable, though he has increased pain. The CA 19-9 is lower than when he started the single agent Abraxane. We decided to proceed with a sixth treatment with single agent Abraxane today. We obtained a repeat CA 19-9 today. He will return for an office visit next week. We will monitor the neuropathy symptoms closely.  The plan is  to obtain a  restaging CT if the CA 19-9 is consistently higher or his pain progresses.   Betsy Coder, MD  03/14/2013  2:23 PM

## 2013-03-14 NOTE — Patient Instructions (Signed)
[facility] Discharge Instructions for Patients Receiving Chemotherapy  Today you received the following chemotherapy agents Abraxane  To help prevent nausea and vomiting after your treatment, we encourage you to take your nausea medication as prescribed.   If you develop nausea and vomiting that is not controlled by your nausea medication, call the clinic.   BELOW ARE SYMPTOMS THAT SHOULD BE REPORTED IMMEDIATELY:  *FEVER GREATER THAN 100.5 F  *CHILLS WITH OR WITHOUT FEVER  NAUSEA AND VOMITING THAT IS NOT CONTROLLED WITH YOUR NAUSEA MEDICATION  *UNUSUAL SHORTNESS OF BREATH  *UNUSUAL BRUISING OR BLEEDING  TENDERNESS IN MOUTH AND THROAT WITH OR WITHOUT PRESENCE OF ULCERS  *URINARY PROBLEMS  *BOWEL PROBLEMS  UNUSUAL RASH Items with * indicate a potential emergency and should be followed up as soon as possible.  Feel free to call the clinic you have any questions or concerns. The clinic phone number is [phone].    

## 2013-03-14 NOTE — Telephone Encounter (Signed)
gave pt appt for lab,md and chemo for January 2015 °

## 2013-03-20 ENCOUNTER — Other Ambulatory Visit: Payer: Self-pay | Admitting: Oncology

## 2013-03-21 ENCOUNTER — Encounter: Payer: Self-pay | Admitting: Oncology

## 2013-03-21 ENCOUNTER — Ambulatory Visit: Payer: Self-pay

## 2013-03-21 ENCOUNTER — Ambulatory Visit (HOSPITAL_BASED_OUTPATIENT_CLINIC_OR_DEPARTMENT_OTHER): Payer: Medicare Other | Admitting: Nurse Practitioner

## 2013-03-21 ENCOUNTER — Other Ambulatory Visit (HOSPITAL_BASED_OUTPATIENT_CLINIC_OR_DEPARTMENT_OTHER): Payer: Medicare Other

## 2013-03-21 ENCOUNTER — Telehealth: Payer: Self-pay | Admitting: Oncology

## 2013-03-21 ENCOUNTER — Other Ambulatory Visit: Payer: Self-pay

## 2013-03-21 ENCOUNTER — Ambulatory Visit: Payer: Self-pay | Admitting: Nurse Practitioner

## 2013-03-21 VITALS — BP 98/59 | HR 60 | Temp 97.6°F | Resp 18 | Ht 68.0 in | Wt 159.8 lb

## 2013-03-21 DIAGNOSIS — R109 Unspecified abdominal pain: Secondary | ICD-10-CM

## 2013-03-21 DIAGNOSIS — D6959 Other secondary thrombocytopenia: Secondary | ICD-10-CM

## 2013-03-21 DIAGNOSIS — R040 Epistaxis: Secondary | ICD-10-CM

## 2013-03-21 DIAGNOSIS — C250 Malignant neoplasm of head of pancreas: Secondary | ICD-10-CM

## 2013-03-21 DIAGNOSIS — C259 Malignant neoplasm of pancreas, unspecified: Secondary | ICD-10-CM

## 2013-03-21 DIAGNOSIS — G893 Neoplasm related pain (acute) (chronic): Secondary | ICD-10-CM

## 2013-03-21 DIAGNOSIS — G589 Mononeuropathy, unspecified: Secondary | ICD-10-CM

## 2013-03-21 DIAGNOSIS — C787 Secondary malignant neoplasm of liver and intrahepatic bile duct: Secondary | ICD-10-CM

## 2013-03-21 LAB — COMPREHENSIVE METABOLIC PANEL (CC13)
ALT: 50 U/L (ref 0–55)
ANION GAP: 9 meq/L (ref 3–11)
AST: 33 U/L (ref 5–34)
Albumin: 3.6 g/dL (ref 3.5–5.0)
Alkaline Phosphatase: 169 U/L — ABNORMAL HIGH (ref 40–150)
BUN: 14.5 mg/dL (ref 7.0–26.0)
CALCIUM: 8.9 mg/dL (ref 8.4–10.4)
CHLORIDE: 106 meq/L (ref 98–109)
CO2: 22 meq/L (ref 22–29)
CREATININE: 0.8 mg/dL (ref 0.7–1.3)
GLUCOSE: 123 mg/dL (ref 70–140)
Potassium: 4.4 mEq/L (ref 3.5–5.1)
Sodium: 137 mEq/L (ref 136–145)
Total Bilirubin: 0.64 mg/dL (ref 0.20–1.20)
Total Protein: 6.5 g/dL (ref 6.4–8.3)

## 2013-03-21 LAB — CBC WITH DIFFERENTIAL/PLATELET
BASO%: 0.7 % (ref 0.0–2.0)
BASOS ABS: 0 10*3/uL (ref 0.0–0.1)
EOS ABS: 0.1 10*3/uL (ref 0.0–0.5)
EOS%: 3.8 % (ref 0.0–7.0)
HEMATOCRIT: 26.2 % — AB (ref 38.4–49.9)
HEMOGLOBIN: 8.6 g/dL — AB (ref 13.0–17.1)
LYMPH%: 23.9 % (ref 14.0–49.0)
MCH: 32.2 pg (ref 27.2–33.4)
MCHC: 32.8 g/dL (ref 32.0–36.0)
MCV: 98.3 fL — ABNORMAL HIGH (ref 79.3–98.0)
MONO#: 0.2 10*3/uL (ref 0.1–0.9)
MONO%: 9.6 % (ref 0.0–14.0)
NEUT%: 62 % (ref 39.0–75.0)
NEUTROS ABS: 1.3 10*3/uL — AB (ref 1.5–6.5)
Platelets: 110 10*3/uL — ABNORMAL LOW (ref 140–400)
RBC: 2.67 10*6/uL — ABNORMAL LOW (ref 4.20–5.82)
RDW: 16.6 % — ABNORMAL HIGH (ref 11.0–14.6)
WBC: 2.1 10*3/uL — AB (ref 4.0–10.3)
lymph#: 0.5 10*3/uL — ABNORMAL LOW (ref 0.9–3.3)

## 2013-03-21 LAB — CANCER ANTIGEN 19-9: CA 19 9: 7711 U/mL — AB (ref <35.0)

## 2013-03-21 MED ORDER — HYDROCODONE-ACETAMINOPHEN 5-325 MG PO TABS: 1.0000 | ORAL_TABLET | Freq: Four times a day (QID) | ORAL | Status: DC | PRN

## 2013-03-21 NOTE — Telephone Encounter (Signed)
Called pt and left message regarding appt for 03/24/13

## 2013-03-21 NOTE — Telephone Encounter (Signed)
Gave pt appt for lab and ML, gave him oral contrast for CT, emailed Sharyn Lull regarding chemo on 1/22

## 2013-03-21 NOTE — Progress Notes (Addendum)
OFFICE PROGRESS NOTE  Interval history:  Randall [name] returns for followup of metastatic pancreas cancer. He was last treated with Abraxane on 03/14/2013. He denies nausea/vomiting. He notes increased numbness in the fingers greater than toes. The numbness is present on a consistent basis. There is mild interference with activity. Appetite is "so-so". Weight is stable. He reports increased lower abdominal pain for the past week. He describes the pain as "gas pain". The pain is present intermittently. He had no pain yesterday. He takes Vicodin with resolution of the pain. He estimates taking one Vicodin a day. He denies fever. No shortness of breath. Occasional cough.   Objective: Filed Vitals:   03/21/13 0937  BP: 98/59  Pulse: 60  Temp: 97.6 F (36.4 C)  Resp: 18   Oropharynx is without thrush or ulceration. Lungs are clear. Regular cardiac rhythm. Port-A-Cath site without erythema. Abdomen soft, question mild distention. Nontender. No organomegaly. No leg edema. Moderate decrease in vibratory sense over the fingertips bilaterally.   Lab Results: Lab Results  Component Value Date   WBC 2.1* 03/21/2013   HGB 8.6* 03/21/2013   HCT 26.2* 03/21/2013   MCV 98.3* 03/21/2013   PLT 110* 03/21/2013   NEUTROABS 1.3* 03/21/2013    Chemistry:    Chemistry      Component Value Date/Time   NA 137 03/21/2013 0922   NA 136 08/18/2012 0457   K 4.4 03/21/2013 0922   K 4.5 08/18/2012 0457   CL 102 08/18/2012 0457   CL 103 08/04/2012 0955   CO2 22 03/21/2013 0922   CO2 27 08/18/2012 0457   BUN 14.5 03/21/2013 0922   BUN 30* 08/18/2012 0457   CREATININE 0.8 03/21/2013 0922   CREATININE 0.73 08/18/2012 0457      Component Value Date/Time   CALCIUM 8.9 03/21/2013 0922   CALCIUM 8.9 08/18/2012 0457   ALKPHOS 169* 03/21/2013 0922   ALKPHOS 145* 08/15/2012 0351   AST 33 03/21/2013 0922   AST 58* 08/15/2012 0351   ALT 50 03/21/2013 0922   ALT 85* 08/15/2012 0351   BILITOT 0.64 03/21/2013 0922   BILITOT 0.4  08/15/2012 0351     03/14/2013 CA 19-9 9667  Studies/Results: No results found.  Medications: I have reviewed the patient's current medications.  Assessment/Plan: 1. Metastatic pancreas cancer. The clinical presentation, elevated CA 19-9 and abdominal CT consistent with a diagnosis of metastatic pancreas cancer. 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 Initiation of gemcitabine/Abraxane on 07/21/2012. Last treated on 08/04/2012.  The CA 19-9 was improved on 09/01/2012 following 3 treatments with gemcitabine/Abraxane.  Restaging CT evaluation 09/29/2012 showed interval decrease in the left lower lobe pulmonary nodule; interval decrease in the size of the pancreatic head mass with decreasing regional lymphadenopathy and decreasing metastatic disease within the liver. No new or progressive disease noted.  CA 19-9 40775 on 07/07/2012, 1631 on 09/01/2012 and 2105 on 09/29/2012, ~2292 on 10/26/2012, ~ 5765 on 11/23/2012, ~5720 on 12/07/2012, 8383 on 01/04/2013.  Cycle 1 FOLFOX 10/12/2012, cycle 2 10/26/2012, cycle 3 11/23/2012, cycle 4 12/07/2012, cycle 5 12/21/2012, cycle 6 01/04/2013.  Restaging CT evaluation 01/17/2013 showed multiple new parenchymal and subpleural nodules, nonspecific; increased pancreatic head mass/regional adenopathy; mixed response in the liver.  Initiation of Abraxane 01/18/2013. 03/07/2013 CA 19-9 mildly increased from previous value; further increased on 03/14/2013. 2. Pain secondary to #1. Mild progression over the past month. 3. Acute myocardial infarction 06/16/2012 status post PTCA/drug-eluting stent, maintained on  Brilinta. 4. Status post Port-A-Cath placement 07/16/2012. 5. Admission with high fever and chills on 08/11/2012. CT of the chest revealed multilobar infiltrates. He was diagnosed with chemotherapy induced pneumonitis. He improved with steroids. Chest x-ray  09/15/2012 showed continued improvement. 6. Mucositis following cycle 4 FOLFOX. The 5-FU was dose reduced beginning with cycle 5. 7. Thrombocytopenia secondary to chemotherapy. The oxaliplatin was dose reduced beginning with cycle 5 FOLFOX. 8. History of Neutropenia secondary to chemotherapy 9. Intermittent nose bleeding-likely related to mucositis from chemotherapy and aspirin/brilinta 10. 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 Progressive.  Dispositon-he has completed 6 treatments with Abraxane. The CA 19-9 was further increased on 03/14/2013. He has progressive neuropathy symptoms and increased abdominal pain. We are holding today's treatment. We will followup on the CA 19-9 from today. If stable or improved plan to proceed with treatment on 03/24/2013. In addition we are referring him for restaging CT scans on 03/25/2013. He will return for a followup visit on 03/28/2013.  A new Vicodin prescription was provided at today's visit.  Patient seen with Dr. Benay Spice.     Ned Card ANP/GNP-BC  This was a shared visit with Ned Card. I am concerned [name] has disease progression based on his increased pain, elevated CA 19-9, and anorexia. We decided to hold Abraxane today. He will be scheduled for a restaging CT on 03/25/2013. We will refer him for consideration of a clinical trial if the CT confirms disease progression.

## 2013-03-22 ENCOUNTER — Encounter: Payer: Self-pay | Admitting: Oncology

## 2013-03-22 ENCOUNTER — Other Ambulatory Visit: Payer: Self-pay | Admitting: Oncology

## 2013-03-22 ENCOUNTER — Encounter: Payer: Self-pay | Admitting: Internal Medicine

## 2013-03-23 ENCOUNTER — Telehealth: Payer: Self-pay | Admitting: *Deleted

## 2013-03-23 MED ORDER — ALPRAZOLAM 0.5 MG PO TABS: 0.5000 mg | ORAL_TABLET | Freq: Three times a day (TID) | ORAL | Status: AC | PRN

## 2013-03-23 NOTE — Telephone Encounter (Signed)
Lattie Haw, NP called pt. He will have treatment and follow up as scheduled.

## 2013-03-23 NOTE — Telephone Encounter (Signed)
Called pt, he viewed lab result online. Asks if his Abraxane dose will be decreased due to neuropathy? He reports it is tolerable, only annoying. Feet are more bothersome than hands.

## 2013-03-23 NOTE — Telephone Encounter (Signed)
Message copied by Brien Few on Wed Mar 23, 2013 11:17 AM ------      Message from: Ladell Pier      Created: Tue Mar 22, 2013  9:53 PM       Please call patient, ca19-9 is better, continue with abraxane 1/22 ------

## 2013-03-24 ENCOUNTER — Other Ambulatory Visit (HOSPITAL_BASED_OUTPATIENT_CLINIC_OR_DEPARTMENT_OTHER): Payer: Medicare Other

## 2013-03-24 ENCOUNTER — Ambulatory Visit (HOSPITAL_BASED_OUTPATIENT_CLINIC_OR_DEPARTMENT_OTHER): Payer: Medicare Other

## 2013-03-24 VITALS — BP 98/48 | HR 55 | Temp 97.0°F | Resp 18

## 2013-03-24 DIAGNOSIS — C250 Malignant neoplasm of head of pancreas: Secondary | ICD-10-CM

## 2013-03-24 DIAGNOSIS — C259 Malignant neoplasm of pancreas, unspecified: Secondary | ICD-10-CM

## 2013-03-24 DIAGNOSIS — C787 Secondary malignant neoplasm of liver and intrahepatic bile duct: Secondary | ICD-10-CM

## 2013-03-24 DIAGNOSIS — Z5111 Encounter for antineoplastic chemotherapy: Secondary | ICD-10-CM

## 2013-03-24 LAB — CBC WITH DIFFERENTIAL/PLATELET
BASO%: 0.4 % (ref 0.0–2.0)
Basophils Absolute: 0 10*3/uL (ref 0.0–0.1)
EOS%: 2.3 % (ref 0.0–7.0)
Eosinophils Absolute: 0.1 10*3/uL (ref 0.0–0.5)
HEMATOCRIT: 25.5 % — AB (ref 38.4–49.9)
HGB: 8.7 g/dL — ABNORMAL LOW (ref 13.0–17.1)
LYMPH#: 0.5 10*3/uL — AB (ref 0.9–3.3)
LYMPH%: 23.7 % (ref 14.0–49.0)
MCH: 33.1 pg (ref 27.2–33.4)
MCHC: 34 g/dL (ref 32.0–36.0)
MCV: 97.4 fL (ref 79.3–98.0)
MONO#: 0.3 10*3/uL (ref 0.1–0.9)
MONO%: 14.9 % — ABNORMAL HIGH (ref 0.0–14.0)
NEUT#: 1.3 10*3/uL — ABNORMAL LOW (ref 1.5–6.5)
NEUT%: 58.7 % (ref 39.0–75.0)
Platelets: 104 10*3/uL — ABNORMAL LOW (ref 140–400)
RBC: 2.61 10*6/uL — ABNORMAL LOW (ref 4.20–5.82)
RDW: 16.5 % — AB (ref 11.0–14.6)
WBC: 2.2 10*3/uL — AB (ref 4.0–10.3)

## 2013-03-24 MED ORDER — DEXAMETHASONE SODIUM PHOSPHATE 10 MG/ML IJ SOLN
INTRAMUSCULAR | Status: AC
  Filled 2013-03-24: qty 1

## 2013-03-24 MED ORDER — HEPARIN SOD (PORK) LOCK FLUSH 100 UNIT/ML IV SOLN
500.0000 [IU] | Freq: Once | INTRAVENOUS | Status: AC | PRN
  Administered 2013-03-24: 500 [IU]
  Filled 2013-03-24: qty 5

## 2013-03-24 MED ORDER — SODIUM CHLORIDE 0.9 % IJ SOLN
10.0000 mL | INTRAMUSCULAR | Status: DC | PRN
  Administered 2013-03-24: 10 mL
  Filled 2013-03-24: qty 10

## 2013-03-24 MED ORDER — SODIUM CHLORIDE 0.9 % IV SOLN
Freq: Once | INTRAVENOUS | Status: AC
  Administered 2013-03-24: 20 mL via INTRAVENOUS

## 2013-03-24 MED ORDER — PACLITAXEL PROTEIN-BOUND CHEMO INJECTION 100 MG
100.0000 mg/m2 | Freq: Once | INTRAVENOUS | Status: AC
  Administered 2013-03-24: 175 mg via INTRAVENOUS
  Filled 2013-03-24: qty 35

## 2013-03-24 MED ORDER — DEXAMETHASONE SODIUM PHOSPHATE 10 MG/ML IJ SOLN
10.0000 mg | Freq: Once | INTRAMUSCULAR | Status: AC
  Administered 2013-03-24: 10 mg via INTRAVENOUS

## 2013-03-24 MED ORDER — ONDANSETRON 8 MG/50ML IVPB (CHCC)
8.0000 mg | Freq: Once | INTRAVENOUS | Status: AC
  Administered 2013-03-24: 8 mg via INTRAVENOUS

## 2013-03-24 MED ORDER — ONDANSETRON 8 MG/NS 50 ML IVPB
INTRAVENOUS | Status: AC
  Filled 2013-03-24: qty 8

## 2013-03-28 ENCOUNTER — Telehealth: Payer: Self-pay | Admitting: Oncology

## 2013-03-28 ENCOUNTER — Other Ambulatory Visit (HOSPITAL_BASED_OUTPATIENT_CLINIC_OR_DEPARTMENT_OTHER): Payer: Medicare Other

## 2013-03-28 ENCOUNTER — Ambulatory Visit (HOSPITAL_COMMUNITY)
Admission: RE | Admit: 2013-03-28 | Discharge: 2013-03-28 | Disposition: A | Discharge status: Home / Self Care | Payer: Medicare Other | Source: Ambulatory Visit | Attending: Nurse Practitioner | Admitting: Nurse Practitioner

## 2013-03-28 ENCOUNTER — Ambulatory Visit (HOSPITAL_BASED_OUTPATIENT_CLINIC_OR_DEPARTMENT_OTHER): Payer: Medicare Other | Admitting: Nurse Practitioner

## 2013-03-28 ENCOUNTER — Encounter (HOSPITAL_COMMUNITY): Payer: Self-pay

## 2013-03-28 VITALS — BP 122/47 | HR 61 | Temp 97.7°F | Resp 18 | Ht 68.0 in | Wt 165.0 lb

## 2013-03-28 DIAGNOSIS — C259 Malignant neoplasm of pancreas, unspecified: Secondary | ICD-10-CM

## 2013-03-28 DIAGNOSIS — R911 Solitary pulmonary nodule: Secondary | ICD-10-CM | POA: Insufficient documentation

## 2013-03-28 DIAGNOSIS — C250 Malignant neoplasm of head of pancreas: Secondary | ICD-10-CM

## 2013-03-28 DIAGNOSIS — J438 Other emphysema: Secondary | ICD-10-CM | POA: Insufficient documentation

## 2013-03-28 DIAGNOSIS — I81 Portal vein thrombosis: Secondary | ICD-10-CM | POA: Insufficient documentation

## 2013-03-28 DIAGNOSIS — R161 Splenomegaly, not elsewhere classified: Secondary | ICD-10-CM | POA: Insufficient documentation

## 2013-03-28 DIAGNOSIS — R188 Other ascites: Secondary | ICD-10-CM | POA: Insufficient documentation

## 2013-03-28 DIAGNOSIS — D6959 Other secondary thrombocytopenia: Secondary | ICD-10-CM

## 2013-03-28 DIAGNOSIS — I2510 Atherosclerotic heart disease of native coronary artery without angina pectoris: Secondary | ICD-10-CM | POA: Insufficient documentation

## 2013-03-28 DIAGNOSIS — C787 Secondary malignant neoplasm of liver and intrahepatic bile duct: Secondary | ICD-10-CM | POA: Insufficient documentation

## 2013-03-28 DIAGNOSIS — J8410 Pulmonary fibrosis, unspecified: Secondary | ICD-10-CM | POA: Insufficient documentation

## 2013-03-28 LAB — CBC WITH DIFFERENTIAL/PLATELET
BASO%: 0 % (ref 0.0–2.0)
Basophils Absolute: 0 10*3/uL (ref 0.0–0.1)
EOS%: 1.9 % (ref 0.0–7.0)
Eosinophils Absolute: 0 10*3/uL (ref 0.0–0.5)
HCT: 24.8 % — ABNORMAL LOW (ref 38.4–49.9)
HGB: 8.1 g/dL — ABNORMAL LOW (ref 13.0–17.1)
LYMPH%: 23.4 % (ref 14.0–49.0)
MCH: 30.8 pg (ref 27.2–33.4)
MCHC: 32.7 g/dL (ref 32.0–36.0)
MCV: 94.3 fL (ref 79.3–98.0)
MONO#: 0.2 10*3/uL (ref 0.1–0.9)
MONO%: 7.5 % (ref 0.0–14.0)
NEUT#: 1.4 10*3/uL — ABNORMAL LOW (ref 1.5–6.5)
NEUT%: 67.2 % (ref 39.0–75.0)
NRBC: 0 % (ref 0–0)
PLATELETS: 79 10*3/uL — AB (ref 140–400)
RBC: 2.63 10*6/uL — AB (ref 4.20–5.82)
RDW: 15.5 % — ABNORMAL HIGH (ref 11.0–14.6)
WBC: 2.1 10*3/uL — ABNORMAL LOW (ref 4.0–10.3)
lymph#: 0.5 10*3/uL — ABNORMAL LOW (ref 0.9–3.3)

## 2013-03-28 MED ORDER — IOHEXOL 300 MG/ML  SOLN
100.0000 mL | Freq: Once | INTRAMUSCULAR | Status: AC | PRN
  Administered 2013-03-28: 100 mL via INTRAVENOUS

## 2013-03-28 NOTE — Telephone Encounter (Signed)
gv adn printed appt sched and avs for pt for Feb....sed added tx.   °

## 2013-03-28 NOTE — Progress Notes (Addendum)
OFFICE PROGRESS NOTE  Interval history:  Randall [name] returns for follow up of metastatic pancreas cancer. CA 19-9 tumor marker was improved on 03/21/2013 at 7711. He was last treated with Abraxane 03/24/2013.  He denies nausea/vomiting. No mouth sores. No diarrhea. He continues to have intermittent abdominal pain. He takes Vicodin periodically. He denies fever. He notes dyspnea on exertion with "heavy walking". He has an occasional cough. He has persistent numbness in the fingertips and toes. He describes the numbness as "annoying". There is minimal interference with activity.   Objective: Filed Vitals:   03/28/13 1439  BP: 122/47  Pulse: 61  Temp: 97.7 F (36.5 C)  Resp: 18   Oropharynx is without thrush or ulceration. Lungs are clear. Regular cardiac rhythm. Port-A-Cath site without erythema. Abdomen soft and nontender. No organomegaly. No mass. No leg edema. Moderate decrease in vibratory sense over the fingertips on the right hand and mild decrease in vibratory sense over the fingertips on the left hand per tuning fork exam.   Lab Results: Lab Results  Component Value Date   WBC 2.1* 03/28/2013   HGB 8.1* 03/28/2013   HCT 24.8* 03/28/2013   MCV 94.3 03/28/2013   PLT 79* 03/28/2013   NEUTROABS 1.4* 03/28/2013    Chemistry:    Chemistry      Component Value Date/Time   NA 137 03/21/2013 0922   NA 136 08/18/2012 0457   K 4.4 03/21/2013 0922   K 4.5 08/18/2012 0457   CL 102 08/18/2012 0457   CL 103 08/04/2012 0955   CO2 22 03/21/2013 0922   CO2 27 08/18/2012 0457   BUN 14.5 03/21/2013 0922   BUN 30* 08/18/2012 0457   CREATININE 0.8 03/21/2013 0922   CREATININE 0.73 08/18/2012 0457      Component Value Date/Time   CALCIUM 8.9 03/21/2013 0922   CALCIUM 8.9 08/18/2012 0457   ALKPHOS 169* 03/21/2013 0922   ALKPHOS 145* 08/15/2012 0351   AST 33 03/21/2013 0922   AST 58* 08/15/2012 0351   ALT 50 03/21/2013 0922   ALT 85* 08/15/2012 0351   BILITOT 0.64 03/21/2013 0922   BILITOT 0.4 08/15/2012  0351       Studies/Results: No results found.  Medications: I have reviewed the patient's current medications.  Assessment/Plan: 1. Metastatic pancreas cancer. The clinical presentation, elevated CA 19-9 and abdominal CT consistent with a diagnosis of metastatic pancreas cancer. 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 Initiation of gemcitabine/Abraxane on 07/21/2012. Last treated on 08/04/2012.  The CA 19-9 was improved on 09/01/2012 following 3 treatments with gemcitabine/Abraxane.  Restaging CT evaluation 09/29/2012 showed interval decrease in the left lower lobe pulmonary nodule; interval decrease in the size of the pancreatic head mass with decreasing regional lymphadenopathy and decreasing metastatic disease within the liver. No new or progressive disease noted.  CA 19-9 40775 on 07/07/2012, 1631 on 09/01/2012 and 2105 on 09/29/2012, ~2292 on 10/26/2012, ~ 5765 on 11/23/2012, ~5720 on 12/07/2012, 8383 on 01/04/2013.  Cycle 1 FOLFOX 10/12/2012, cycle 2 10/26/2012, cycle 3 11/23/2012, cycle 4 12/07/2012, cycle 5 12/21/2012, cycle 6 01/04/2013.  Restaging CT evaluation 01/17/2013 showed multiple new parenchymal and subpleural nodules, nonspecific; increased pancreatic head mass/regional adenopathy; mixed response in the liver.  Initiation of Abraxane 01/18/2013.  03/07/2013 CA 19-9 mildly increased from previous value; further increased on 03/14/2013. CA 19-9 improved on 03/21/2013. 2. Pain secondary to #1. Mild progression over the past month. 3. Acute myocardial infarction 06/16/2012  status post PTCA/drug-eluting stent, maintained on Brilinta. 4. Status post Port-A-Cath placement 07/16/2012. 5. Admission with high fever and chills on 08/11/2012. CT of the chest revealed multilobar infiltrates. He was diagnosed with chemotherapy induced pneumonitis. He improved with steroids. Chest x-ray  09/15/2012 showed continued improvement. 6. Mucositis following cycle 4 FOLFOX. The 5-FU was dose reduced beginning with cycle 5. 7. Thrombocytopenia secondary to chemotherapy. The oxaliplatin was dose reduced beginning with cycle 5 FOLFOX. 8. History of neutropenia secondary to chemotherapy 9. Intermittent nose bleeding-likely related to mucositis from chemotherapy and aspirin/brilinta 10. 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   Dispositon-he appears stable. The final report on the restaging CT evaluation earlier today is pending. Dr. Benay Spice reviewed the images on the computer with Randall [name]. There appears to be somewhat of a mixed response in the liver. We will await the final report. If the overall trend is for improvement the plan is to continue with Abraxane. He will return for a followup visit on 04/20/2013.   Patient seen with Dr. Benay Spice.  25 minutes were spent face-to-face at today's visit with the majority that time involved in counseling/coordination of care.   Ned Card ANP/GNP-BC   This was a shared visit with Ned Card. We reviewed the restaging CT scans with Randall [name]. My initial impression was that many of the liver lesions appear smaller. The final radiology report indicates progressive disease in the liver. I will review the CTs in radiology on 03/29/2013 and contact Randall [name] with my final impression. The plan is to proceed with Abraxane if there is stable or improved disease. If there is clear disease progression we will refer him back to [facility] to discuss systemic treatment options.  Julieanne Manson, M.D.

## 2013-03-30 ENCOUNTER — Encounter: Payer: Self-pay | Admitting: Oncology

## 2013-04-01 ENCOUNTER — Encounter: Payer: Self-pay | Admitting: Oncology

## 2013-04-03 ENCOUNTER — Other Ambulatory Visit: Payer: Self-pay | Admitting: Oncology

## 2013-04-04 ENCOUNTER — Ambulatory Visit (HOSPITAL_BASED_OUTPATIENT_CLINIC_OR_DEPARTMENT_OTHER): Payer: Medicare Other

## 2013-04-04 ENCOUNTER — Other Ambulatory Visit (HOSPITAL_BASED_OUTPATIENT_CLINIC_OR_DEPARTMENT_OTHER): Payer: Medicare Other

## 2013-04-04 VITALS — BP 114/63 | HR 58 | Temp 98.2°F | Resp 18

## 2013-04-04 DIAGNOSIS — C250 Malignant neoplasm of head of pancreas: Secondary | ICD-10-CM

## 2013-04-04 DIAGNOSIS — C259 Malignant neoplasm of pancreas, unspecified: Secondary | ICD-10-CM

## 2013-04-04 DIAGNOSIS — Z5111 Encounter for antineoplastic chemotherapy: Secondary | ICD-10-CM

## 2013-04-04 DIAGNOSIS — C787 Secondary malignant neoplasm of liver and intrahepatic bile duct: Secondary | ICD-10-CM

## 2013-04-04 LAB — CBC WITH DIFFERENTIAL/PLATELET
BASO%: 0.4 % (ref 0.0–2.0)
Basophils Absolute: 0 10*3/uL (ref 0.0–0.1)
EOS ABS: 0.1 10*3/uL (ref 0.0–0.5)
EOS%: 1.9 % (ref 0.0–7.0)
HCT: 26.2 % — ABNORMAL LOW (ref 38.4–49.9)
HGB: 8.8 g/dL — ABNORMAL LOW (ref 13.0–17.1)
LYMPH%: 17.5 % (ref 14.0–49.0)
MCH: 32.2 pg (ref 27.2–33.4)
MCHC: 33.6 g/dL (ref 32.0–36.0)
MCV: 95.9 fL (ref 79.3–98.0)
MONO#: 0.4 10*3/uL (ref 0.1–0.9)
MONO%: 11.7 % (ref 0.0–14.0)
NEUT%: 68.5 % (ref 39.0–75.0)
NEUTROS ABS: 2.1 10*3/uL (ref 1.5–6.5)
PLATELETS: 107 10*3/uL — AB (ref 140–400)
RBC: 2.73 10*6/uL — AB (ref 4.20–5.82)
RDW: 16.7 % — AB (ref 11.0–14.6)
WBC: 3.1 10*3/uL — ABNORMAL LOW (ref 4.0–10.3)
lymph#: 0.5 10*3/uL — ABNORMAL LOW (ref 0.9–3.3)

## 2013-04-04 MED ORDER — DEXAMETHASONE SODIUM PHOSPHATE 10 MG/ML IJ SOLN
INTRAMUSCULAR | Status: AC
  Filled 2013-04-04: qty 1

## 2013-04-04 MED ORDER — HEPARIN SOD (PORK) LOCK FLUSH 100 UNIT/ML IV SOLN
500.0000 [IU] | Freq: Once | INTRAVENOUS | Status: AC | PRN
  Administered 2013-04-04: 500 [IU]
  Filled 2013-04-04: qty 5

## 2013-04-04 MED ORDER — SODIUM CHLORIDE 0.9 % IJ SOLN
10.0000 mL | INTRAMUSCULAR | Status: DC | PRN
  Administered 2013-04-04: 10 mL
  Filled 2013-04-04: qty 10

## 2013-04-04 MED ORDER — SODIUM CHLORIDE 0.9 % IV SOLN
Freq: Once | INTRAVENOUS | Status: AC
  Administered 2013-04-04: 13:00:00 via INTRAVENOUS

## 2013-04-04 MED ORDER — PACLITAXEL PROTEIN-BOUND CHEMO INJECTION 100 MG
100.0000 mg/m2 | Freq: Once | INTRAVENOUS | Status: AC
  Administered 2013-04-04: 175 mg via INTRAVENOUS
  Filled 2013-04-04: qty 35

## 2013-04-04 MED ORDER — DEXAMETHASONE SODIUM PHOSPHATE 10 MG/ML IJ SOLN
10.0000 mg | Freq: Once | INTRAMUSCULAR | Status: AC
  Administered 2013-04-04: 10 mg via INTRAVENOUS

## 2013-04-04 MED ORDER — ONDANSETRON 8 MG/50ML IVPB (CHCC)
8.0000 mg | Freq: Once | INTRAVENOUS | Status: AC
  Administered 2013-04-04: 8 mg via INTRAVENOUS

## 2013-04-04 MED ORDER — ONDANSETRON 8 MG/NS 50 ML IVPB
INTRAVENOUS | Status: AC
  Filled 2013-04-04: qty 8

## 2013-04-04 NOTE — Progress Notes (Signed)
Pt reports increasing neuropathy in hands and feet and asked me to address with Dr. Benay Spice. It mainly affects his walking by causing increased pain. Per Pt it is not affecting other ADL's.  Per Dr Benay Spice I told pt that this dose may increase neuropathy and Dr Benay Spice said pt can hold this dose and revaluate him at next office visit. Pt does not know what to do and called his daughter for her input.

## 2013-04-04 NOTE — Patient Instructions (Signed)
[facility] Discharge Instructions for Patients Receiving Chemotherapy  Today you received the following chemotherapy agents Abraxane  To help prevent nausea and vomiting after your treatment, we encourage you to take your nausea medication as prescribed.   If you develop nausea and vomiting that is not controlled by your nausea medication, call the clinic.   BELOW ARE SYMPTOMS THAT SHOULD BE REPORTED IMMEDIATELY:  *FEVER GREATER THAN 100.5 F  *CHILLS WITH OR WITHOUT FEVER  NAUSEA AND VOMITING THAT IS NOT CONTROLLED WITH YOUR NAUSEA MEDICATION  *UNUSUAL SHORTNESS OF BREATH  *UNUSUAL BRUISING OR BLEEDING  TENDERNESS IN MOUTH AND THROAT WITH OR WITHOUT PRESENCE OF ULCERS  *URINARY PROBLEMS  *BOWEL PROBLEMS  UNUSUAL RASH Items with * indicate a potential emergency and should be followed up as soon as possible.  Feel free to call the clinic you have any questions or concerns. The clinic phone number is [phone].    

## 2013-04-07 ENCOUNTER — Telehealth: Payer: Self-pay | Admitting: *Deleted

## 2013-04-07 ENCOUNTER — Encounter: Payer: Self-pay | Admitting: Oncology

## 2013-04-07 NOTE — Telephone Encounter (Signed)
VM requesting to speak with Ned Card, NP or Dr. Benay Spice about his neuropathy symptoms. Will not be available today until after 4 pm, but will be available all day on 04/08/13.

## 2013-04-07 NOTE — Telephone Encounter (Signed)
I attempted to return Lauren Ahles's call from earlier today. No answer. Left a message on voicemail to please call us back.

## 2013-04-08 ENCOUNTER — Encounter: Payer: Self-pay | Admitting: *Deleted

## 2013-04-08 NOTE — Telephone Encounter (Signed)
Cancelled appointments and notified patient via voice mail and My Chart.

## 2013-04-09 ENCOUNTER — Encounter (HOSPITAL_COMMUNITY): Payer: Self-pay | Admitting: Cardiovascular Disease

## 2013-04-10 ENCOUNTER — Encounter: Payer: Self-pay | Admitting: Oncology

## 2013-04-10 ENCOUNTER — Encounter: Payer: Self-pay | Admitting: Nurse Practitioner

## 2013-04-11 ENCOUNTER — Ambulatory Visit: Payer: Self-pay

## 2013-04-11 ENCOUNTER — Other Ambulatory Visit: Payer: Self-pay

## 2013-04-12 ENCOUNTER — Other Ambulatory Visit: Payer: Self-pay | Admitting: Nurse Practitioner

## 2013-04-12 MED ORDER — ATORVASTATIN CALCIUM 80 MG PO TABS: 80.0000 mg | ORAL_TABLET | Freq: Every day | ORAL | Status: DC

## 2013-04-12 MED ORDER — RAMIPRIL 1.25 MG PO CAPS: 1.2500 mg | ORAL_CAPSULE | Freq: Every day | ORAL | Status: DC

## 2013-04-18 ENCOUNTER — Telehealth: Payer: Self-pay | Admitting: *Deleted

## 2013-04-18 NOTE — Telephone Encounter (Signed)
Daughter reports patient is having more abdominal bloating,distention and pain that she reports "not in character with his usual cancer pain". 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 Benay Spice. Reviewed with her husband, who is CVTS and is asking if he should be anticoagulated now, rather than later for this? Very anxious to discuss this with Dr. Benay Spice or Ned Card if one could call her. Reports Kaelob coming back to [facility] and will see Dr. Benay Spice on Wednesday. Message to MD desk.

## 2013-04-19 NOTE — Telephone Encounter (Signed)
Per Office note---Dr. Benay Spice spoke with pt's daughter regarding pt's case and instructions were given to take pt to ED if pain worsen.

## 2013-04-20 ENCOUNTER — Other Ambulatory Visit (HOSPITAL_BASED_OUTPATIENT_CLINIC_OR_DEPARTMENT_OTHER): Payer: Medicare Other

## 2013-04-20 ENCOUNTER — Ambulatory Visit: Payer: Self-pay

## 2013-04-20 ENCOUNTER — Ambulatory Visit (HOSPITAL_COMMUNITY)
Admission: RE | Admit: 2013-04-20 | Discharge: 2013-04-20 | Disposition: A | Discharge status: Home / Self Care | Payer: Medicare Other | Source: Ambulatory Visit | Attending: Oncology | Admitting: Oncology

## 2013-04-20 ENCOUNTER — Ambulatory Visit (HOSPITAL_BASED_OUTPATIENT_CLINIC_OR_DEPARTMENT_OTHER): Payer: Medicare Other | Admitting: Oncology

## 2013-04-20 ENCOUNTER — Telehealth: Payer: Self-pay | Admitting: Oncology

## 2013-04-20 ENCOUNTER — Other Ambulatory Visit: Payer: Self-pay | Admitting: *Deleted

## 2013-04-20 VITALS — BP 119/54 | HR 68 | Temp 97.3°F | Resp 18 | Ht 68.0 in | Wt 163.7 lb

## 2013-04-20 DIAGNOSIS — R141 Gas pain: Secondary | ICD-10-CM

## 2013-04-20 DIAGNOSIS — C250 Malignant neoplasm of head of pancreas: Secondary | ICD-10-CM

## 2013-04-20 DIAGNOSIS — C259 Malignant neoplasm of pancreas, unspecified: Secondary | ICD-10-CM

## 2013-04-20 DIAGNOSIS — C787 Secondary malignant neoplasm of liver and intrahepatic bile duct: Secondary | ICD-10-CM

## 2013-04-20 DIAGNOSIS — R143 Flatulence: Secondary | ICD-10-CM

## 2013-04-20 DIAGNOSIS — I82890 Acute embolism and thrombosis of other specified veins: Secondary | ICD-10-CM

## 2013-04-20 DIAGNOSIS — G893 Neoplasm related pain (acute) (chronic): Secondary | ICD-10-CM

## 2013-04-20 DIAGNOSIS — R180 Malignant ascites: Secondary | ICD-10-CM | POA: Insufficient documentation

## 2013-04-20 DIAGNOSIS — R142 Eructation: Secondary | ICD-10-CM

## 2013-04-20 DIAGNOSIS — R109 Unspecified abdominal pain: Secondary | ICD-10-CM

## 2013-04-20 LAB — CBC WITH DIFFERENTIAL/PLATELET
BASO%: 0.5 % (ref 0.0–2.0)
Basophils Absolute: 0 10*3/uL (ref 0.0–0.1)
EOS ABS: 0.1 10*3/uL (ref 0.0–0.5)
EOS%: 3.3 % (ref 0.0–7.0)
HEMATOCRIT: 27.2 % — AB (ref 38.4–49.9)
HEMOGLOBIN: 8.8 g/dL — AB (ref 13.0–17.1)
LYMPH#: 0.6 10*3/uL — AB (ref 0.9–3.3)
LYMPH%: 17.2 % (ref 14.0–49.0)
MCH: 30.3 pg (ref 27.2–33.4)
MCHC: 32.4 g/dL (ref 32.0–36.0)
MCV: 93.6 fL (ref 79.3–98.0)
MONO#: 0.6 10*3/uL (ref 0.1–0.9)
MONO%: 17 % — ABNORMAL HIGH (ref 0.0–14.0)
NEUT#: 2.1 10*3/uL (ref 1.5–6.5)
NEUT%: 62 % (ref 39.0–75.0)
PLATELETS: 100 10*3/uL — AB (ref 140–400)
RBC: 2.91 10*6/uL — ABNORMAL LOW (ref 4.20–5.82)
RDW: 17 % — ABNORMAL HIGH (ref 11.0–14.6)
WBC: 3.4 10*3/uL — AB (ref 4.0–10.3)

## 2013-04-20 LAB — COMPREHENSIVE METABOLIC PANEL (CC13)
ALT: 155 U/L — ABNORMAL HIGH (ref 0–55)
ANION GAP: 12 meq/L — AB (ref 3–11)
AST: 148 U/L — ABNORMAL HIGH (ref 5–34)
Albumin: 3.8 g/dL (ref 3.5–5.0)
Alkaline Phosphatase: 169 U/L — ABNORMAL HIGH (ref 40–150)
BILIRUBIN TOTAL: 1.03 mg/dL (ref 0.20–1.20)
BUN: 18.5 mg/dL (ref 7.0–26.0)
CALCIUM: 9.4 mg/dL (ref 8.4–10.4)
CHLORIDE: 105 meq/L (ref 98–109)
CO2: 22 meq/L (ref 22–29)
Creatinine: 1 mg/dL (ref 0.7–1.3)
GLUCOSE: 147 mg/dL — AB (ref 70–140)
Potassium: 4.3 mEq/L (ref 3.5–5.1)
Sodium: 139 mEq/L (ref 136–145)
Total Protein: 6.7 g/dL (ref 6.4–8.3)

## 2013-04-20 LAB — CANCER ANTIGEN 19-9: CA 19-9: 25406.3 U/mL — ABNORMAL HIGH (ref <35.0)

## 2013-04-20 MED ORDER — ENOXAPARIN SODIUM 100 MG/ML ~~LOC~~ SOLN
100.0000 mg | Freq: Once | SUBCUTANEOUS | Status: AC
  Administered 2013-04-20: 100 mg via SUBCUTANEOUS
  Filled 2013-04-20: qty 1

## 2013-04-20 MED ORDER — ENOXAPARIN SODIUM 100 MG/ML ~~LOC~~ SOLN: 100.0000 mg | Freq: Every day | SUBCUTANEOUS | Status: DC

## 2013-04-20 NOTE — Addendum Note (Signed)
Addended by: Domenic Schwab on: 04/20/2013 12:31 PM   Modules accepted: Orders

## 2013-04-20 NOTE — Progress Notes (Signed)
Randall [name]    OFFICE PROGRESS NOTE   INTERVAL HISTORY:   Randall [name] returns for scheduled followup of metastatic pancreas cancer. He returned from a trip to the [facility] yesterday. He complains of increased abdominal pain for the past week. The pain is partially relieved with hydrocodone. He has been taking hydrocodone approximately twice daily. He reports constipation, but he is having bowel movements when he takes stool softeners. No nausea or vomiting. He has developed abdominal distention for the past 4-5 days. Stable numbness in the fingers and toes.  Objective:  Vital signs in last 24 hours:  Blood pressure 119/54, pulse 68, temperature 97.3 F (36.3 C), temperature source Oral, resp. rate 18, height 5\' 8"  (1.727 m), weight 163 lb 11.2 oz (74.254 kg), SpO2 98.00%.    HEENT: [place] over the tongue, no thrush Resp: Lungs clear bilaterally Cardio: Regular rate and rhythm GI: The abdomen is distended, bowel sounds are present, no mass, no hepatomegaly, nontender Vascular: Trace pitting edema at the low pretibial area and ankle bilaterally Neuro: Mild decrease in vibratory sense at the fingertips bilaterally    Portacath/PICC-without erythema  Lab Results:  Lab Results  Component Value Date   WBC 3.4* 04/20/2013   HGB 8.8* 04/20/2013   HCT 27.2* 04/20/2013   MCV 93.6 04/20/2013   PLT 100* 04/20/2013   NEUTROABS 2.1 04/20/2013      Medications: I have reviewed the patient's current medications.  Assessment/Plan: 1. Metastatic pancreas cancer. The clinical presentation, elevated CA 19-9 and abdominal CT consistent with a diagnosis of metastatic pancreas cancer. 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 Initiation of gemcitabine/Abraxane on 07/21/2012. Last treated on 08/04/2012.  The CA 19-9 was improved on 09/01/2012 following 3 treatments with  gemcitabine/Abraxane.  Restaging CT evaluation 09/29/2012 showed interval decrease in the left lower lobe pulmonary nodule; interval decrease in the size of the pancreatic head mass with decreasing regional lymphadenopathy and decreasing metastatic disease within the liver. No new or progressive disease noted.  CA 19-9 40775 on 07/07/2012, 1631 on 09/01/2012 and 2105 on 09/29/2012, ~2292 on 10/26/2012, ~ 5765 on 11/23/2012, ~5720 on 12/07/2012, 8383 on 01/04/2013.  Cycle 1 FOLFOX 10/12/2012, cycle 2 10/26/2012, cycle 3 11/23/2012, cycle 4 12/07/2012, cycle 5 12/21/2012, cycle 6 01/04/2013.  Restaging CT evaluation 01/17/2013 showed multiple new parenchymal and subpleural nodules, nonspecific; increased pancreatic head mass/regional adenopathy; mixed response in the liver.  Initiation of Abraxane 01/18/2013.  03/07/2013 CA 19-9 mildly increased from previous value; further increased on 03/14/2013.  CA 19-9 improved on 03/21/2013. Restaging CT 03/31/2013 with a mixed response in the liver, worsening of portal vein thrombosis 2. Pain secondary to #1. Mild progression over the past month. 3. Acute myocardial infarction 06/16/2012 status post PTCA/drug-eluting stent, maintained on Brilinta. 4. Status post Port-A-Cath placement 07/16/2012. 5. Admission with high fever and chills on 08/11/2012. CT of the chest revealed multilobar infiltrates. He was diagnosed with chemotherapy induced pneumonitis. He improved with steroids. Chest x-ray 09/15/2012 showed continued improvement. 6. Mucositis following cycle 4 FOLFOX. The 5-FU was dose reduced beginning with cycle 5. 7. Thrombocytopenia secondary to chemotherapy. The oxaliplatin was dose reduced beginning with cycle 5 FOLFOX. 8. History of neutropenia secondary to chemotherapy 9. Intermittent nose bleeding-likely related to mucositis from chemotherapy and aspirin/brilinta 10. 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 11. Portal vein thrombosis-reviewed the 03/31/2013 and previous CTs reveals evidence of chronic clot in  the splenic and portal veins with collateral venous formation. 12. Distended abdomen with increased pain-? Secondary to progression of the pancreas cancer with carcinomatosis versus portal hypertension  Disposition:  He has developed increased abdominal pain and distention over the past week. He most likely has ascites. He will be referred for a diagnostic/therapeutic paracentesis today. It is possible his symptoms are related to venous thrombosis. We will begin anticoagulation therapy with Lovenox. The Brilinta will be discontinued. He will continue aspirin. I discussed the case with Dr. Burt Knack.  The splenic/portal vein thrombosis is likely chronic, but with the current symptom I think anticoagulation therapy is indicated.  He will return for an office visit to 04/21/2013. He will increase the use of Vicodin as needed. He can take up to 8 Vicodin tablets per day.  He is scheduled for an appointment with Dr. Berneice Gandy at [facility] next week to discuss salvage systemic therapy options.   Betsy Coder, MD  04/20/2013  10:06 AM

## 2013-04-20 NOTE — Telephone Encounter (Signed)
Gave pt appt for ML tomorrow and paracenthesis today

## 2013-04-20 NOTE — Telephone Encounter (Signed)
Teaching given to patient and family members re: pt giving himself daily Lovenox injections.  Pt performed procedure correctly; gave himself injection in office, and voiced understanding of all instructions given verbally and on handout forms. Pt instructed to call office if any questions.

## 2013-04-20 NOTE — Procedures (Addendum)
Successful US guided paracentesis from RLQ.  Yielded 1L of cloudy yellow fluid.  No immediate complications.  Pt tolerated well.   Specimen was sent for labs.  Ascencion Dike PA-C 04/20/2013 10:52 AM

## 2013-04-21 ENCOUNTER — Ambulatory Visit (HOSPITAL_BASED_OUTPATIENT_CLINIC_OR_DEPARTMENT_OTHER): Payer: Medicare Other | Admitting: Nurse Practitioner

## 2013-04-21 ENCOUNTER — Encounter: Payer: Self-pay | Admitting: Nurse Practitioner

## 2013-04-21 ENCOUNTER — Other Ambulatory Visit: Payer: Self-pay | Admitting: *Deleted

## 2013-04-21 ENCOUNTER — Telehealth: Payer: Self-pay | Admitting: Oncology

## 2013-04-21 ENCOUNTER — Ambulatory Visit (HOSPITAL_BASED_OUTPATIENT_CLINIC_OR_DEPARTMENT_OTHER): Payer: Medicare Other

## 2013-04-21 VITALS — BP 127/73 | HR 70 | Temp 97.4°F | Resp 18 | Ht 68.0 in | Wt 163.6 lb

## 2013-04-21 DIAGNOSIS — C250 Malignant neoplasm of head of pancreas: Secondary | ICD-10-CM

## 2013-04-21 DIAGNOSIS — C259 Malignant neoplasm of pancreas, unspecified: Secondary | ICD-10-CM

## 2013-04-21 DIAGNOSIS — R180 Malignant ascites: Secondary | ICD-10-CM

## 2013-04-21 DIAGNOSIS — C787 Secondary malignant neoplasm of liver and intrahepatic bile duct: Secondary | ICD-10-CM

## 2013-04-21 DIAGNOSIS — I81 Portal vein thrombosis: Secondary | ICD-10-CM

## 2013-04-21 LAB — COMPREHENSIVE METABOLIC PANEL (CC13)
ALBUMIN: 3.8 g/dL (ref 3.5–5.0)
ALK PHOS: 186 U/L — AB (ref 40–150)
ALT: 173 U/L — ABNORMAL HIGH (ref 0–55)
AST: 136 U/L — AB (ref 5–34)
Anion Gap: 10 mEq/L (ref 3–11)
BUN: 15.4 mg/dL (ref 7.0–26.0)
CO2: 24 mEq/L (ref 22–29)
Calcium: 9.3 mg/dL (ref 8.4–10.4)
Chloride: 103 mEq/L (ref 98–109)
Creatinine: 0.9 mg/dL (ref 0.7–1.3)
Glucose: 139 mg/dl (ref 70–140)
Potassium: 4.5 mEq/L (ref 3.5–5.1)
SODIUM: 137 meq/L (ref 136–145)
Total Bilirubin: 1.04 mg/dL (ref 0.20–1.20)
Total Protein: 6.7 g/dL (ref 6.4–8.3)

## 2013-04-21 MED ORDER — OXYCODONE HCL 5 MG PO TABS: 5.0000 mg | ORAL_TABLET | Freq: Four times a day (QID) | ORAL | Status: DC | PRN

## 2013-04-21 NOTE — Progress Notes (Addendum)
OFFICE PROGRESS NOTE  Interval history:  Randall [name] returns for followup of metastatic pancreas cancer. The CA 19-9 was further elevated at greater than 25,000 on 04/20/2013. He underwent a paracentesis on 04/20/2013. 1 L of fluid was removed. Cytology showed malignant cells consistent with metastatic adenocarcinoma.  He noted slight improvement in the abdominal distention for about an hour after the paracentesis. He continues to have abdominal pain. Vicodin provides partial relief.   Objective: Filed Vitals:   04/21/13 1457  BP: 127/73  Pulse: 70  Temp: 97.4 F (36.3 C)  Resp: 18   No thrush. Lungs clear. Regular cardiac rhythm. Port-A-Cath site without erythema. Abdomen is distended. No obvious mass or hepatomegaly. Trace edema at the lower legs bilaterally.   Lab Results: Lab Results  Component Value Date   WBC 3.4* 04/20/2013   HGB 8.8* 04/20/2013   HCT 27.2* 04/20/2013   MCV 93.6 04/20/2013   PLT 100* 04/20/2013   NEUTROABS 2.1 04/20/2013    Chemistry:    Chemistry      Component Value Date/Time   NA 137 04/21/2013 1437   NA 136 08/18/2012 0457   K 4.5 04/21/2013 1437   K 4.5 08/18/2012 0457   CL 102 08/18/2012 0457   CL 103 08/04/2012 0955   CO2 24 04/21/2013 1437   CO2 27 08/18/2012 0457   BUN 15.4 04/21/2013 1437   BUN 30* 08/18/2012 0457   CREATININE 0.9 04/21/2013 1437   CREATININE 0.73 08/18/2012 0457      Component Value Date/Time   CALCIUM 9.3 04/21/2013 1437   CALCIUM 8.9 08/18/2012 0457   ALKPHOS 186* 04/21/2013 1437   ALKPHOS 145* 08/15/2012 0351   AST 136* 04/21/2013 1437   AST 58* 08/15/2012 0351   ALT 173* 04/21/2013 1437   ALT 85* 08/15/2012 0351   BILITOT 1.04 04/21/2013 1437   BILITOT 0.4 08/15/2012 0351      Medications: I have reviewed the patient's current medications.  Assessment/Plan: 1. Metastatic pancreas cancer. The clinical presentation, elevated CA 19-9 and abdominal CT consistent with a diagnosis of metastatic pancreas cancer. Ultrasound-guided  biopsy of a liver lesion on 07/16/2012 confirmed metastatic adenocarcinoma consistent with metastatic pancreas cancer. Initiation of gemcitabine/Abraxane on 07/21/2012. Last treated on 08/04/2012.  The CA 19-9 was improved on 09/01/2012 following 3 treatments with gemcitabine/Abraxane.  Restaging CT evaluation 09/29/2012 showed interval decrease in the left lower lobe pulmonary nodule; interval decrease in the size of the pancreatic head mass with decreasing regional lymphadenopathy and decreasing metastatic disease within the liver. No new or progressive disease noted.  CA 19-9 40775 on 07/07/2012, 1631 on 09/01/2012 and 2105 on 09/29/2012, ~2292 on 10/26/2012, ~ 5765 on 11/23/2012, ~5720 on 12/07/2012, 8383 on 01/04/2013.  Cycle 1 FOLFOX 10/12/2012, cycle 2 10/26/2012, cycle 3 11/23/2012, cycle 4 12/07/2012, cycle 5 12/21/2012, cycle 6 01/04/2013.  Restaging CT evaluation 01/17/2013 showed multiple new parenchymal and subpleural nodules, nonspecific; increased pancreatic head mass/regional adenopathy; mixed response in the liver.  Initiation of Abraxane 01/18/2013.  03/07/2013 CA 19-9 mildly increased from previous value; further increased on 03/14/2013.  CA 19-9 improved on 03/21/2013.  Restaging CT 03/31/2013 with a mixed response in the liver, worsening of portal vein thrombosis. CA 19-9 increased 04/20/2013. Malignant ascites status post paracentesis 04/20/2013 with cytology showing malignant cells consistent with metastatic adenocarcinoma. 2. Pain secondary to #1. Mild progression over the past month. 3. Acute myocardial infarction 06/16/2012 status post PTCA/drug-eluting stent, maintained on Brilinta. 4. Status post Port-A-Cath placement 07/16/2012. 5. Admission with high  fever and chills on 08/11/2012. CT of the chest revealed multilobar infiltrates. He was diagnosed with chemotherapy induced pneumonitis. He improved with steroids. Chest x-ray 09/15/2012 showed continued  improvement. 6. Mucositis following cycle 4 FOLFOX. The 5-FU was dose reduced beginning with cycle 5. 7. Thrombocytopenia secondary to chemotherapy. The oxaliplatin was dose reduced beginning with cycle 5 FOLFOX. 8. History of neutropenia secondary to chemotherapy 9. Intermittent nose bleeding-likely related to mucositis from chemotherapy and aspirin/brilinta 10. 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 11. 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 12. Distended abdomen with increased pain-? Secondary to progression of the pancreas cancer with carcinomatosis versus portal hypertension. Paracentesis 04/20/2013 with cytology confirming malignant cells consistent with metastatic adenocarcinoma.  Dispositon-Dr. Benay Spice reviewed the CA 19-9 result and positive cytology from the paracentesis with [name] and his daughter. They understand the abdominal distention is most likely due to abdominal carcinomatosis rather than portal hypertension. He will discontinue the Lovenox injections and resume Brilinta.   Dr. Benay Spice recommended a supportive/comfort care approach to Mr. Hoos. He has an appointment with Dr. Berneice Gandy at [facility] next week to discuss salvage systemic therapy options. He would like to keep that appointment and then return for a followup visit here for additional discussion. He will return for an appointment with Dr. Benay Spice on 04/27/2013.  He is uncomfortable from the abdominal distention. We made a referral for a therapeutic paracentesis. He was given a prescription for oxycodone 5 mg 1-2 tablets every 6 hours as needed. He will contact the office with poor pain control.   Patient seen with Dr. Benay Spice. 25 minutes were spent face-to-face at today's visit with the majority of the time involved in counseling/coordination of  care.   Ned Card ANP/GNP-BC   This was a shared visit with Ned Card. I discussed the cytology finding with [name]. The abdominal distention appears to be related to abdominal carcinomatosis. We decided to discontinue Lovenox since the ascites appears related to carcinomatosis as opposed to portal vein thrombosis. He reports it will be difficult for him to afford the Lovenox.  His prognosis is poor. I recommend comfort/supportive care. I explained the low response rates associated with systemic therapy in patients with pancreas cancer and carcinomatosis. He will see Dr. Berneice Gandy next week for a second opinion. Mr. Marlin will return for an office visit here on 04/27/2013.  We adjusted the narcotic regimen today. He will be referred for a palliative therapeutic paracentesis on 04/22/2013.  Julieanne Manson, M.D.

## 2013-04-21 NOTE — Telephone Encounter (Signed)
gv pt appt schedule for feb. central will call w/appt for parcentesis - pt aware.

## 2013-04-21 NOTE — Progress Notes (Signed)
Abdomen distended sore to touch.

## 2013-04-22 ENCOUNTER — Ambulatory Visit (HOSPITAL_COMMUNITY)
Admission: RE | Admit: 2013-04-22 | Discharge: 2013-04-22 | Disposition: A | Discharge status: Home / Self Care | Payer: Medicare Other | Source: Ambulatory Visit | Attending: Nurse Practitioner | Admitting: Nurse Practitioner

## 2013-04-22 ENCOUNTER — Other Ambulatory Visit: Payer: Self-pay | Admitting: Cardiovascular Disease

## 2013-04-22 DIAGNOSIS — R180 Malignant ascites: Secondary | ICD-10-CM | POA: Insufficient documentation

## 2013-04-22 DIAGNOSIS — C259 Malignant neoplasm of pancreas, unspecified: Secondary | ICD-10-CM

## 2013-04-22 NOTE — Procedures (Signed)
Successful US guided paracentesis from LLQ.  Yielded 2.6L of bloody ascitic fluid.  No immediate complications.  Pt tolerated well.   Specimen was not sent for labs.  Ascencion Dike PA-C 04/22/2013 11:08 AM

## 2013-04-25 ENCOUNTER — Telehealth: Payer: Self-pay | Admitting: *Deleted

## 2013-04-25 ENCOUNTER — Encounter (HOSPITAL_COMMUNITY): Payer: Self-pay | Admitting: Cardiovascular Disease

## 2013-04-25 MED ORDER — MORPHINE SULFATE 15 MG PO TABS: 15.0000 mg | ORAL_TABLET | ORAL | Status: DC | PRN

## 2013-04-25 NOTE — Telephone Encounter (Signed)
Call from pt's daughter reporting he is unable to eat. Has severe post prandial pain. Has been on clear liquids for x 3 days. Constipation x 6 days, passing gas. She reports severe abdominal pain that predates the constipation. Paracentesis 2/18 and 2/20 with little pain relief. Taking Oxycodone 10 mg Q 4-6 hours. Reviewed with Dr. Benay Spice: Order received to change pian med to MSIR 15 mg. 1-2 tabs Q 4hours PRN. Use an enema today, then Miralax daily. Called instructions to [place]. She voiced understanding. They will pick up Rx in the office today.

## 2013-04-27 ENCOUNTER — Encounter: Payer: Self-pay | Admitting: Oncology

## 2013-04-27 ENCOUNTER — Telehealth: Payer: Self-pay | Admitting: Oncology

## 2013-04-27 ENCOUNTER — Other Ambulatory Visit: Payer: Self-pay

## 2013-04-27 ENCOUNTER — Ambulatory Visit (HOSPITAL_BASED_OUTPATIENT_CLINIC_OR_DEPARTMENT_OTHER): Payer: Medicare Other | Admitting: Oncology

## 2013-04-27 ENCOUNTER — Ambulatory Visit (HOSPITAL_COMMUNITY)
Admission: RE | Admit: 2013-04-27 | Discharge: 2013-04-27 | Disposition: A | Discharge status: Home / Self Care | Payer: Medicare Other | Source: Ambulatory Visit | Attending: Oncology | Admitting: Oncology

## 2013-04-27 VITALS — BP 104/62 | HR 79 | Temp 98.0°F | Resp 20 | Ht 68.0 in | Wt 159.4 lb

## 2013-04-27 DIAGNOSIS — C787 Secondary malignant neoplasm of liver and intrahepatic bile duct: Secondary | ICD-10-CM

## 2013-04-27 DIAGNOSIS — G620 Drug-induced polyneuropathy: Secondary | ICD-10-CM

## 2013-04-27 DIAGNOSIS — C259 Malignant neoplasm of pancreas, unspecified: Secondary | ICD-10-CM | POA: Insufficient documentation

## 2013-04-27 DIAGNOSIS — R188 Other ascites: Secondary | ICD-10-CM | POA: Insufficient documentation

## 2013-04-27 DIAGNOSIS — K5900 Constipation, unspecified: Secondary | ICD-10-CM

## 2013-04-27 DIAGNOSIS — I81 Portal vein thrombosis: Secondary | ICD-10-CM

## 2013-04-27 DIAGNOSIS — R180 Malignant ascites: Secondary | ICD-10-CM

## 2013-04-27 DIAGNOSIS — C250 Malignant neoplasm of head of pancreas: Secondary | ICD-10-CM

## 2013-04-27 DIAGNOSIS — D6959 Other secondary thrombocytopenia: Secondary | ICD-10-CM

## 2013-04-27 NOTE — Procedures (Signed)
Successful US guided paracentesis from LLQ.  Yielded 2 liters of bloody fluid fluid.  No immediate complications.  Pt tolerated well.   Specimen was not sent for labs.  Tsosie Billing D PA-C 04/27/2013 12:55 PM

## 2013-04-27 NOTE — Progress Notes (Signed)
[place]    OFFICE PROGRESS NOTE   INTERVAL HISTORY:   Randall [name] returns for scheduled followup of pancreas cancer. He saw Dr. Berneice Gandy at [facility] on 04/26/2013. He recommends considering a trial of FOLFIRI.  Randall [name] complains of abdominal distention and pain. He also has low back pain. He was prescribed MS Contin yesterday but has not yet started this. Constipation was relieved with magnesium citrate. He has a poor appetite and early satiety. The neuropathy symptoms are unchanged.  Objective:  Vital signs in last 24 hours:  Blood pressure 104/62, pulse 79, temperature 98 F (36.7 C), resp. rate 20, height 5\' 8"  (1.727 m), weight 159 lb 6.4 oz (72.303 kg).    HEENT: No thrush or ulcers Resp: Lungs clear bilaterally Cardio: Regular rate and rhythm GI: The abdomen is markedly distended with ascites. No mass Vascular: No leg edema   Portacath/PICC-without erythema  Lab Results:  Lab Results  Component Value Date   WBC 3.4* 04/20/2013   HGB 8.8* 04/20/2013   HCT 27.2* 04/20/2013   MCV 93.6 04/20/2013   PLT 100* 04/20/2013   NEUTROABS 2.1 04/20/2013      Medications: I have reviewed the patient's current medications.  Assessment/Plan: 1. Metastatic pancreas cancer. The clinical presentation, elevated CA 19-9 and abdominal CT consistent with a diagnosis of metastatic pancreas cancer. 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 Initiation of gemcitabine/Abraxane on 07/21/2012. Last treated on 08/04/2012.  The CA 19-9 was improved on 09/01/2012 following 3 treatments with gemcitabine/Abraxane.  Restaging CT evaluation 09/29/2012 showed interval decrease in the left lower lobe pulmonary nodule; interval decrease in the size of the pancreatic head mass with decreasing regional lymphadenopathy and decreasing metastatic disease within the liver. No new or progressive  disease noted.  CA 19-9 40775 on 07/07/2012, 1631 on 09/01/2012 and 2105 on 09/29/2012, ~2292 on 10/26/2012, ~ 5765 on 11/23/2012, ~5720 on 12/07/2012, 8383 on 01/04/2013.  Cycle 1 FOLFOX 10/12/2012, cycle 2 10/26/2012, cycle 3 11/23/2012, cycle 4 12/07/2012, cycle 5 12/21/2012, cycle 6 01/04/2013.  Restaging CT evaluation 01/17/2013 showed multiple new parenchymal and subpleural nodules, nonspecific; increased pancreatic head mass/regional adenopathy; mixed response in the liver.  Initiation of Abraxane 01/18/2013.  03/07/2013 CA 19-9 mildly increased from previous value; further increased on 03/14/2013.  CA 19-9 improved on 03/21/2013.  Restaging CT 03/31/2013 with a mixed response in the liver, worsening of portal vein thrombosis.  CA 19-9 increased 04/20/2013.  Malignant ascites status post paracentesis 04/20/2013 with cytology showing malignant cells consistent with metastatic adenocarcinoma. 2. Pain secondary to #1. Mild progression over the past month. 3. Acute myocardial infarction 06/16/2012 status post PTCA/drug-eluting stent, maintained on Brilinta. 4. Status post Port-A-Cath placement 07/16/2012. 5. Admission with high fever and chills on 08/11/2012. CT of the chest revealed multilobar infiltrates. He was diagnosed with chemotherapy induced pneumonitis. He improved with steroids. Chest x-ray 09/15/2012 showed continued improvement. 6. Mucositis following cycle 4 FOLFOX. The 5-FU was dose reduced beginning with cycle 5. 7. Thrombocytopenia secondary to chemotherapy. The oxaliplatin was dose reduced beginning with cycle 5 FOLFOX. 8. History of neutropenia secondary to chemotherapy 9. Intermittent nose bleeding-likely related to mucositis from chemotherapy and aspirin/brilinta 10. 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 11. Portal vein thrombosis-reviewed the 03/31/2013 and previous CTs reveals evidence of chronic clot in the splenic and portal veins  with collateral venous formation. 12. Distended abdomen with increased pain-? Secondary to progression  of the pancreas cancer with carcinomatosis versus portal hypertension. Paracentesis 04/20/2013 with cytology confirming malignant cells consistent with metastatic adenocarcinoma. 27.  constipation-most likely secondary to narcotics and carcinomatosis  Disposition:  Randall [name] is symptomatic with abdominal distention and pain. He has constipation. He will be referred for a palliative therapeutic paracentesis today. He will begin MS Contin and continue MSIR/oxycodone for breakthrough pain. He will begin a bowel regimen.  I discussed the prognosis and treatment options with Randall [name] and his daughter. He understands there is a small chance of clinical benefit with further systemic therapy. There is no proven survival benefit with systemic therapy in this setting. We discussed FOLFIRI chemotherapy. I reviewed the potential toxicities associated with irinotecan including the chance for diarrhea, alopecia, mucositis, and abdominal pain. We also discussed hospice care.  He and his daughter will consider hospice versus a trial of FOLFIRI. He will return for an office visit on 05/02/2013.  We discussed the risk/benefit of anticoagulation therapy. He is not able to afford  Lovenox. It is possible the portal vein thrombosis is contributing to the ascites and abdominal pain. He also is at high-risk of developing new areas of venous thrombosis. He decided against anticoagulation therapy given his prognosis and difficulty paying for medication.   Betsy Coder, MD  04/27/2013  8:46 AM

## 2013-04-27 NOTE — Telephone Encounter (Signed)
gv and printed appt sched and avs for pt for March....pt going to have para radiology NOW!! [facility] per [facility] radiology

## 2013-04-28 ENCOUNTER — Encounter: Payer: Self-pay | Admitting: *Deleted

## 2013-04-28 NOTE — Progress Notes (Signed)
[place] Work  Clinical Social Work was referred by patient for assessment of psychosocial needs due to concerns with treatment planning.  Clinical Social Worker spoke with Randall [name] and his daughter over the phone to offer support and assess for needs.  Daughter, Randall [name] lives in [place] and is trying to help get her father set up with resources to assist with possible treatment before she returns. Randall [name] asking about ALF, hospice, SNF, HH and others. CSW provided some education over the phone and will meet with Randall [name] and daughter either before or after his appointment on 05/02/13. They agree to phone CSW prior to meeting if the need arises.     Randall Racer, LCSW Clinical Social Worker [facility] for [place] Wednesday, Thursday and Friday Phone: [phone] Fax: [phone]

## 2013-04-29 ENCOUNTER — Telehealth: Payer: Self-pay | Admitting: *Deleted

## 2013-04-29 NOTE — Telephone Encounter (Signed)
Called to request refill on OxyIR. He reports he is on MS Contin 15 mg q12 hours, but have not filled the MSIR yet. Says OxyIR is not real effective. Instructed patient and daughter to fill the MSIR scrip and take prn. Can re-evaluate next week to see if this is effective or not. Both agree to try this instead of refilling the OxyIR.

## 2013-05-02 ENCOUNTER — Telehealth: Payer: Self-pay | Admitting: Oncology

## 2013-05-02 ENCOUNTER — Telehealth: Payer: Self-pay | Admitting: *Deleted

## 2013-05-02 ENCOUNTER — Other Ambulatory Visit: Payer: Self-pay | Admitting: *Deleted

## 2013-05-02 ENCOUNTER — Ambulatory Visit (HOSPITAL_COMMUNITY)
Admission: RE | Admit: 2013-05-02 | Discharge: 2013-05-02 | Disposition: A | Discharge status: Home / Self Care | Payer: Medicare Other | Source: Ambulatory Visit | Attending: Oncology | Admitting: Oncology

## 2013-05-02 ENCOUNTER — Telehealth: Payer: Self-pay | Admitting: Dietician

## 2013-05-02 ENCOUNTER — Ambulatory Visit (HOSPITAL_BASED_OUTPATIENT_CLINIC_OR_DEPARTMENT_OTHER): Payer: Medicare Other | Admitting: Oncology

## 2013-05-02 ENCOUNTER — Encounter: Payer: Self-pay | Admitting: *Deleted

## 2013-05-02 VITALS — BP 113/53 | HR 71 | Temp 97.8°F | Resp 18 | Ht 68.0 in | Wt 155.2 lb

## 2013-05-02 VITALS — BP 117/70

## 2013-05-02 DIAGNOSIS — K5900 Constipation, unspecified: Secondary | ICD-10-CM

## 2013-05-02 DIAGNOSIS — C259 Malignant neoplasm of pancreas, unspecified: Secondary | ICD-10-CM

## 2013-05-02 DIAGNOSIS — R141 Gas pain: Secondary | ICD-10-CM

## 2013-05-02 DIAGNOSIS — R188 Other ascites: Secondary | ICD-10-CM | POA: Insufficient documentation

## 2013-05-02 DIAGNOSIS — D6959 Other secondary thrombocytopenia: Secondary | ICD-10-CM

## 2013-05-02 DIAGNOSIS — I10 Essential (primary) hypertension: Secondary | ICD-10-CM

## 2013-05-02 DIAGNOSIS — R143 Flatulence: Secondary | ICD-10-CM

## 2013-05-02 DIAGNOSIS — R180 Malignant ascites: Secondary | ICD-10-CM

## 2013-05-02 DIAGNOSIS — I81 Portal vein thrombosis: Secondary | ICD-10-CM

## 2013-05-02 DIAGNOSIS — R142 Eructation: Secondary | ICD-10-CM

## 2013-05-02 DIAGNOSIS — C250 Malignant neoplasm of head of pancreas: Secondary | ICD-10-CM

## 2013-05-02 DIAGNOSIS — C787 Secondary malignant neoplasm of liver and intrahepatic bile duct: Secondary | ICD-10-CM

## 2013-05-02 DIAGNOSIS — G5690 Unspecified mononeuropathy of unspecified upper limb: Secondary | ICD-10-CM

## 2013-05-02 DIAGNOSIS — R911 Solitary pulmonary nodule: Secondary | ICD-10-CM

## 2013-05-02 HISTORY — PX: US THORA/PARACENTESIS: HXRAD580

## 2013-05-02 MED ORDER — SPIRONOLACTONE 25 MG PO TABS: 25.0000 mg | ORAL_TABLET | Freq: Every day | ORAL | Status: AC

## 2013-05-02 MED ORDER — FUROSEMIDE 20 MG PO TABS: 10.0000 mg | ORAL_TABLET | Freq: Two times a day (BID) | ORAL | Status: AC

## 2013-05-02 MED ORDER — OXYCODONE HCL 5 MG PO TABS: 5.0000 mg | ORAL_TABLET | Freq: Four times a day (QID) | ORAL | Status: DC | PRN

## 2013-05-02 NOTE — Telephone Encounter (Signed)
Brief Outpatient Oncology Nutrition Note  Patient has been identified to be at risk on malnutrition screen.  Wt Readings from Last 10 Encounters:  04/27/13 159 lb 6.4 oz (72.303 kg)  04/21/13 163 lb 9.6 oz (74.208 kg)  04/20/13 163 lb 11.2 oz (74.254 kg)  03/28/13 165 lb (74.844 kg)  03/21/13 159 lb 12.8 oz (72.485 kg)  03/14/13 160 lb 1.6 oz (72.621 kg)  02/15/13 160 lb 9.6 oz (72.848 kg)  02/08/13 161 lb (73.029 kg)  02/04/13 162 lb (73.483 kg)  02/01/13 160 lb 9.6 oz (72.848 kg)    Patient with pancreatic cancer.  Has been seen previously by the [place] [facility].    Called patient with continued weight loss.  Patient determining continued level of care.  Patient is currently not available.  Message given with the contact information for the [place] [facility].  Antonieta Iba, RD, LDN

## 2013-05-02 NOTE — Telephone Encounter (Signed)
Notified daughter that Dr. Benay Spice spoke with Dr. Berneice Gandy and he does not recommend to re-challenge him with Gemzar. His disease has progressed and it is very doubtful to be effective. Agrees that palliative care is appropriate at this time.  Told her that his scripts were sent in late this afternoon.

## 2013-05-02 NOTE — Telephone Encounter (Signed)
Gave pt appt for Md appt for 3/17, sent to paracenthesis today

## 2013-05-02 NOTE — Progress Notes (Signed)
Clinical Social Work met with pt and pt's daughter at [facility] to follow up regarding questions about home care and "planing for the future".  CSW, pt, and pt's daughter discussed home health, home care, assisted living, skilled care, and hospice.  CSW provided information on multiple services and provided pt with information on community resources.  CSW encouraged pt and/or pt's daughter to call with any additional questions or concerns.    Johnnye Lana, MSW, [place] Worker [facility] [phone]

## 2013-05-02 NOTE — Telephone Encounter (Signed)
Daughter left VM requesting paracenetsis be scheduled today for her father. He is distended and uncomfortable again. Scheduled with [facility] radiology for 1 pm today. Will make patient aware at his office visit today.

## 2013-05-02 NOTE — Procedures (Signed)
US guided therapeutic paracentesis performed yielding 2.4 liters amber fluid. No immediate complications.

## 2013-05-02 NOTE — Progress Notes (Signed)
Randall [name]    OFFICE PROGRESS NOTE   INTERVAL HISTORY:   Randall [name] returns for scheduled followup of pancreas cancer. He underwent a palliative paracentesis for 2 L of bloody fluid on 04/28/2013. He reports the abdominal distention improved for several days and has returned. The abdominal pain has improved with MS Contin. Constipation has also improved. He has early satiety and fullness that limits his ability to eat. He has bruising at the left upper arm and upper back.  Objective:  Vital signs in last 24 hours:  Blood pressure 113/53, pulse 71, temperature 97.8 F (36.6 C), temperature source Oral, resp. rate 18, height 5\' 8"  (1.727 m), weight 155 lb 4 oz (70.421 kg).    Resp: Lungs clear bilaterally Cardio: Regular rate and rhythm GI: Distended Vascular: No leg edema  Skin: Ecchymoses at the mid back and left upper arm   Portacath/PICC-without erythema  Lab Results:  Lab Results  Component Value Date   WBC 3.4* 04/20/2013   HGB 8.8* 04/20/2013   HCT 27.2* 04/20/2013   MCV 93.6 04/20/2013   PLT 100* 04/20/2013   NEUTROABS 2.1 04/20/2013      Medications: I have reviewed the patient's current medications.  Assessment/Plan:  1. Metastatic pancreas cancer. The clinical presentation, elevated CA 19-9 and abdominal CT consistent with a diagnosis of metastatic pancreas cancer. 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 Initiation of gemcitabine/Abraxane on 07/21/2012. Last treated on 08/04/2012.  The CA 19-9 was improved on 09/01/2012 following 3 treatments with gemcitabine/Abraxane.  Restaging CT evaluation 09/29/2012 showed interval decrease in the left lower lobe pulmonary nodule; interval decrease in the size of the pancreatic head mass with decreasing regional lymphadenopathy and decreasing metastatic disease within the liver. No new or progressive disease  noted.  CA 19-9 40775 on 07/07/2012, 1631 on 09/01/2012 and 2105 on 09/29/2012, ~2292 on 10/26/2012, ~ 5765 on 11/23/2012, ~5720 on 12/07/2012, 8383 on 01/04/2013.  Cycle 1 FOLFOX 10/12/2012, cycle 2 10/26/2012, cycle 3 11/23/2012, cycle 4 12/07/2012, cycle 5 12/21/2012, cycle 6 01/04/2013.  Restaging CT evaluation 01/17/2013 showed multiple new parenchymal and subpleural nodules, nonspecific; increased pancreatic head mass/regional adenopathy; mixed response in the liver.  Initiation of Abraxane 01/18/2013.  03/07/2013 CA 19-9 mildly increased from previous value; further increased on 03/14/2013.  CA 19-9 improved on 03/21/2013.  Restaging CT 03/31/2013 with a mixed response in the liver, worsening of portal vein thrombosis.  CA 19-9 increased 04/20/2013.  Malignant ascites status post paracentesis 04/20/2013 with cytology showing malignant cells consistent with metastatic adenocarcinoma. 2. Pain secondary to #1. Mild progression over the past month. 3. Acute myocardial infarction 06/16/2012 status post PTCA/drug-eluting stent, maintained on Brilinta. 4. Status post Port-A-Cath placement 07/16/2012. 5. Admission with high fever and chills on 08/11/2012. CT of the chest revealed multilobar infiltrates. He was diagnosed with chemotherapy induced pneumonitis. He improved with steroids. Chest x-ray 09/15/2012 showed continued improvement. 6. Mucositis following cycle 4 FOLFOX. The 5-FU was dose reduced beginning with cycle 5. 7. Thrombocytopenia secondary to chemotherapy. The oxaliplatin was dose reduced beginning with cycle 5 FOLFOX. 8. History of neutropenia secondary to chemotherapy 9. Intermittent nose bleeding-likely related to mucositis from chemotherapy and aspirin/brilinta 10. 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 11. Portal vein thrombosis-reviewed the 03/31/2013 and previous CTs reveals evidence of chronic clot in the splenic and portal veins with  collateral venous formation. 12. Distended abdomen with increased pain-? Secondary to  progression of the pancreas cancer with carcinomatosis versus portal hypertension. Paracentesis 04/20/2013 with cytology confirming malignant cells consistent with metastatic adenocarcinoma. 62. constipation-most likely secondary to narcotics and carcinomatosis   Disposition:  He continues to be symptomatic with abdominal distention secondary to ascites. He will undergo another paracentesis today. We will schedule weekly paracentesis procedures. He will begin a trial of Aldactone/Lasix.  I discussed treatment options with Mr. Nunziato and his daughter again today. He understands the chance of clinical improvement with further chemotherapy is small. He does not wish to consider FOLFIRI. He asked about repeat treatment with gemcitabine. I think the chance of responding to gemcitabine is small and I am concerned he could again develop pneumonitis.  He asked me to contact Dr. Berneice Gandy to get his opinion. I will try to contact him over the next few days.  He will continue the current pain regimen and return for an office visit on 05/17/2013.   Betsy Coder, MD  05/02/2013  2:03 PM

## 2013-05-03 ENCOUNTER — Inpatient Hospital Stay (HOSPITAL_COMMUNITY): Payer: Medicare Other

## 2013-05-03 ENCOUNTER — Emergency Department (HOSPITAL_COMMUNITY): Payer: Medicare Other

## 2013-05-03 ENCOUNTER — Ambulatory Visit (HOSPITAL_COMMUNITY): Payer: Medicare Other

## 2013-05-03 ENCOUNTER — Inpatient Hospital Stay (HOSPITAL_COMMUNITY)
Admission: EM | Admit: 2013-05-03 | Discharge: 2013-05-06 | DRG: 435 | Disposition: A | Discharge status: Home / Self Care | Payer: Medicare Other | Attending: Internal Medicine | Admitting: Internal Medicine

## 2013-05-03 ENCOUNTER — Encounter (HOSPITAL_COMMUNITY): Payer: Self-pay | Admitting: Emergency Medicine

## 2013-05-03 ENCOUNTER — Telehealth: Payer: Self-pay | Admitting: *Deleted

## 2013-05-03 DIAGNOSIS — D649 Anemia, unspecified: Secondary | ICD-10-CM

## 2013-05-03 DIAGNOSIS — I10 Essential (primary) hypertension: Secondary | ICD-10-CM | POA: Diagnosis present

## 2013-05-03 DIAGNOSIS — Z9221 Personal history of antineoplastic chemotherapy: Secondary | ICD-10-CM

## 2013-05-03 DIAGNOSIS — E860 Dehydration: Secondary | ICD-10-CM | POA: Diagnosis present

## 2013-05-03 DIAGNOSIS — R030 Elevated blood-pressure reading, without diagnosis of hypertension: Secondary | ICD-10-CM

## 2013-05-03 DIAGNOSIS — IMO0002 Reserved for concepts with insufficient information to code with codable children: Secondary | ICD-10-CM

## 2013-05-03 DIAGNOSIS — E43 Unspecified severe protein-calorie malnutrition: Secondary | ICD-10-CM | POA: Diagnosis present

## 2013-05-03 DIAGNOSIS — I81 Portal vein thrombosis: Secondary | ICD-10-CM | POA: Diagnosis present

## 2013-05-03 DIAGNOSIS — K55059 Acute (reversible) ischemia of intestine, part and extent unspecified: Secondary | ICD-10-CM | POA: Diagnosis present

## 2013-05-03 DIAGNOSIS — K8689 Other specified diseases of pancreas: Secondary | ICD-10-CM

## 2013-05-03 DIAGNOSIS — R109 Unspecified abdominal pain: Secondary | ICD-10-CM

## 2013-05-03 DIAGNOSIS — I2510 Atherosclerotic heart disease of native coronary artery without angina pectoris: Secondary | ICD-10-CM

## 2013-05-03 DIAGNOSIS — R1310 Dysphagia, unspecified: Secondary | ICD-10-CM | POA: Diagnosis present

## 2013-05-03 DIAGNOSIS — Z79899 Other long term (current) drug therapy: Secondary | ICD-10-CM

## 2013-05-03 DIAGNOSIS — C259 Malignant neoplasm of pancreas, unspecified: Principal | ICD-10-CM

## 2013-05-03 DIAGNOSIS — I77811 Abdominal aortic ectasia: Secondary | ICD-10-CM

## 2013-05-03 DIAGNOSIS — Z8249 Family history of ischemic heart disease and other diseases of the circulatory system: Secondary | ICD-10-CM

## 2013-05-03 DIAGNOSIS — F411 Generalized anxiety disorder: Secondary | ICD-10-CM

## 2013-05-03 DIAGNOSIS — C787 Secondary malignant neoplasm of liver and intrahepatic bile duct: Secondary | ICD-10-CM | POA: Diagnosis present

## 2013-05-03 DIAGNOSIS — T451X5A Adverse effect of antineoplastic and immunosuppressive drugs, initial encounter: Secondary | ICD-10-CM | POA: Diagnosis present

## 2013-05-03 DIAGNOSIS — I252 Old myocardial infarction: Secondary | ICD-10-CM

## 2013-05-03 DIAGNOSIS — D630 Anemia in neoplastic disease: Secondary | ICD-10-CM | POA: Diagnosis present

## 2013-05-03 DIAGNOSIS — Z7982 Long term (current) use of aspirin: Secondary | ICD-10-CM

## 2013-05-03 DIAGNOSIS — K5730 Diverticulosis of large intestine without perforation or abscess without bleeding: Secondary | ICD-10-CM | POA: Diagnosis present

## 2013-05-03 DIAGNOSIS — K225 Diverticulum of esophagus, acquired: Secondary | ICD-10-CM | POA: Diagnosis present

## 2013-05-03 DIAGNOSIS — R180 Malignant ascites: Secondary | ICD-10-CM | POA: Diagnosis present

## 2013-05-03 DIAGNOSIS — Z66 Do not resuscitate: Secondary | ICD-10-CM | POA: Diagnosis present

## 2013-05-03 DIAGNOSIS — C800 Disseminated malignant neoplasm, unspecified: Secondary | ICD-10-CM | POA: Diagnosis present

## 2013-05-03 DIAGNOSIS — Z87891 Personal history of nicotine dependence: Secondary | ICD-10-CM

## 2013-05-03 DIAGNOSIS — G620 Drug-induced polyneuropathy: Secondary | ICD-10-CM | POA: Diagnosis present

## 2013-05-03 DIAGNOSIS — Z8041 Family history of malignant neoplasm of ovary: Secondary | ICD-10-CM

## 2013-05-03 DIAGNOSIS — R188 Other ascites: Secondary | ICD-10-CM

## 2013-05-03 LAB — CBC WITH DIFFERENTIAL/PLATELET
Basophils Absolute: 0 10*3/uL (ref 0.0–0.1)
Basophils Relative: 0 % (ref 0–1)
EOS ABS: 0.1 10*3/uL (ref 0.0–0.7)
EOS PCT: 0 % (ref 0–5)
HEMATOCRIT: 26.4 % — AB (ref 39.0–52.0)
Hemoglobin: 9 g/dL — ABNORMAL LOW (ref 13.0–17.0)
LYMPHS ABS: 0.8 10*3/uL (ref 0.7–4.0)
Lymphocytes Relative: 7 % — ABNORMAL LOW (ref 12–46)
MCH: 29.4 pg (ref 26.0–34.0)
MCHC: 34.1 g/dL (ref 30.0–36.0)
MCV: 86.3 fL (ref 78.0–100.0)
MONO ABS: 1.1 10*3/uL — AB (ref 0.1–1.0)
MONOS PCT: 9 % (ref 3–12)
Neutro Abs: 9.3 10*3/uL — ABNORMAL HIGH (ref 1.7–7.7)
Neutrophils Relative %: 83 % — ABNORMAL HIGH (ref 43–77)
Platelets: 232 10*3/uL (ref 150–400)
RBC: 3.06 MIL/uL — AB (ref 4.22–5.81)
RDW: 16.2 % — ABNORMAL HIGH (ref 11.5–15.5)
WBC: 11.3 10*3/uL — ABNORMAL HIGH (ref 4.0–10.5)

## 2013-05-03 LAB — COMPREHENSIVE METABOLIC PANEL
ALT: 33 U/L (ref 0–53)
AST: 45 U/L — ABNORMAL HIGH (ref 0–37)
Albumin: 3.2 g/dL — ABNORMAL LOW (ref 3.5–5.2)
Alkaline Phosphatase: 228 U/L — ABNORMAL HIGH (ref 39–117)
BUN: 31 mg/dL — AB (ref 6–23)
CALCIUM: 9 mg/dL (ref 8.4–10.5)
CO2: 24 mEq/L (ref 19–32)
CREATININE: 1.04 mg/dL (ref 0.50–1.35)
Chloride: 92 mEq/L — ABNORMAL LOW (ref 96–112)
GFR calc Af Amer: 80 mL/min — ABNORMAL LOW (ref >90)
GFR calc non Af Amer: 69 mL/min — ABNORMAL LOW (ref >90)
GLUCOSE: 125 mg/dL — AB (ref 70–99)
Potassium: 4.7 mEq/L (ref 3.7–5.3)
SODIUM: 132 meq/L — AB (ref 137–147)
TOTAL PROTEIN: 6.7 g/dL (ref 6.0–8.3)
Total Bilirubin: 1.7 mg/dL — ABNORMAL HIGH (ref 0.3–1.2)

## 2013-05-03 LAB — URINALYSIS, ROUTINE W REFLEX MICROSCOPIC
Bilirubin Urine: NEGATIVE
Glucose, UA: NEGATIVE mg/dL
Hgb urine dipstick: NEGATIVE
Ketones, ur: 15 mg/dL — AB
Leukocytes, UA: NEGATIVE
NITRITE: NEGATIVE
Protein, ur: NEGATIVE mg/dL
SPECIFIC GRAVITY, URINE: 1.015 (ref 1.005–1.030)
UROBILINOGEN UA: 1 mg/dL (ref 0.0–1.0)
pH: 5.5 (ref 5.0–8.0)

## 2013-05-03 LAB — LIPASE, BLOOD: LIPASE: 22 U/L (ref 11–59)

## 2013-05-03 MED ORDER — ENOXAPARIN SODIUM 100 MG/ML ~~LOC~~ SOLN
100.0000 mg | SUBCUTANEOUS | Status: DC
  Administered 2013-05-03 – 2013-05-06 (×4): 100 mg via SUBCUTANEOUS
  Filled 2013-05-03 (×4): qty 1

## 2013-05-03 MED ORDER — ONDANSETRON HCL 4 MG/2ML IJ SOLN
4.0000 mg | Freq: Once | INTRAMUSCULAR | Status: AC
  Administered 2013-05-03: 4 mg via INTRAVENOUS
  Filled 2013-05-03: qty 2

## 2013-05-03 MED ORDER — SODIUM CHLORIDE 0.9 % IV SOLN
INTRAVENOUS | Status: DC
  Administered 2013-05-03: 15:00:00 via INTRAVENOUS

## 2013-05-03 MED ORDER — HYDROMORPHONE HCL PF 1 MG/ML IJ SOLN
1.0000 mg | Freq: Once | INTRAMUSCULAR | Status: AC
Start: 2013-05-03 — End: 2013-05-03
  Administered 2013-05-03: 1 mg via INTRAVENOUS
  Filled 2013-05-03: qty 1

## 2013-05-03 MED ORDER — SODIUM CHLORIDE 0.9 % IV BOLUS (SEPSIS)
1000.0000 mL | Freq: Once | INTRAVENOUS | Status: AC
  Administered 2013-05-03: 1000 mL via INTRAVENOUS

## 2013-05-03 MED ORDER — ENOXAPARIN SODIUM 40 MG/0.4ML ~~LOC~~ SOLN
40.0000 mg | SUBCUTANEOUS | Status: DC
  Filled 2013-05-03: qty 0.4

## 2013-05-03 MED ORDER — ENOXAPARIN SODIUM 60 MG/0.6ML ~~LOC~~ SOLN: 60.0000 mg | SUBCUTANEOUS | Status: DC

## 2013-05-03 MED ORDER — HYDROMORPHONE HCL PF 1 MG/ML IJ SOLN
1.0000 mg | INTRAMUSCULAR | Status: DC | PRN
  Administered 2013-05-03 – 2013-05-04 (×5): 1 mg via INTRAVENOUS
  Filled 2013-05-03 (×5): qty 1

## 2013-05-03 MED ORDER — IOHEXOL 300 MG/ML  SOLN
100.0000 mL | Freq: Once | INTRAMUSCULAR | Status: AC | PRN
  Administered 2013-05-03: 100 mL via INTRAVENOUS

## 2013-05-03 MED ORDER — SODIUM CHLORIDE 0.9 % IV SOLN
INTRAVENOUS | Status: DC
  Administered 2013-05-03 – 2013-05-04 (×2): via INTRAVENOUS

## 2013-05-03 MED ORDER — MORPHINE SULFATE 4 MG/ML IJ SOLN
6.0000 mg | Freq: Once | INTRAMUSCULAR | Status: AC
  Administered 2013-05-03: 6 mg via INTRAVENOUS
  Filled 2013-05-03: qty 2

## 2013-05-03 NOTE — Progress Notes (Signed)
ANTICOAGULATION CONSULT NOTE - Initial Consult  Pharmacy Consult for Lovenox Indication: Portal vein thrombosis  Allergies  Allergen Reactions  . Gemzar [Gemcitabine] Shortness Of Breath    pneumonitis    Patient Measurements:   Weight: 70.4kg as of 05/02/13  Vital Signs: Temp: 97.8 F (36.6 C) (03/03 1217) Temp src: Oral (03/03 1217) BP: 108/60 mmHg (03/03 1553) Pulse Rate: 63 (03/03 1553)  Labs:  Recent Labs  05/03/13 1255  HGB 9.0*  HCT 26.4*  PLT 232  CREATININE 1.04    The CrCl is unknown because both a height and weight (above a minimum accepted value) are required for this calculation. CrCl 64 ml/min/1.30m2 (normalized)   Medical History: Past Medical History  Diagnosis Date  . Anxiety   . Pancreatic cancer metastasized to liver   . MI (myocardial infarction)   . HTN (hypertension)   . Right rotator cuff tear   . Diverticulosis   . Malignant neoplasm of pancreas, part unspecified 07/18/2012    Medications:  Scheduled:   Infusions:  . sodium chloride 125 mL/hr at 05/03/13 1508  . sodium chloride      Assessment: 73 y.o. Male with pancreatic cancer, followed by Dr. Learta Codding, presents to the ED with worsening abdominal pain. Patient had recently been started on Lovenox for portal vein thrombosis, however, he only had 2 doses, then discontinued per Dr. Gearldine Shown notes due to financial issues and poor prognosis. Today, admitting MD ordered to resume Lovenox per Pharmacy - spoke with him to confirm that anticoagulation is warranted, he confirmed.  Weight 70kg  SCr 1.04, CrCl 64 ml/min  CBC improving s/p chemo   Goal of Therapy:  Anti-Xa level 1-2 units/ml 4hrs after LMWH dose given Monitor platelets by anticoagulation protocol: Yes   Plan:   Begin Lovenox 100mg  (1.5mg /kg) sq q24h  Follow renal function, CBC, signs/symptoms of bleeding  Peggyann Juba, PharmD, BCPS Pager: [phone] 05/03/2013,3:58 PM

## 2013-05-03 NOTE — ED Notes (Signed)
Pt seen PCP yesterday and has paracentesis. Pt has not been eating or drinking good for over a week. Pt now has vomiting that started Saturday. Pt has abd pain, pt has pancreatic acncer.

## 2013-05-03 NOTE — Telephone Encounter (Signed)
Message from pt's daughter reporting dry heaves and abdominal pain. Unable to keep fluids down. Pt did have bowel movement. The pain has become unbearable. Reviewed with Dr. Benay Spice: Go to ED to be evaluated. Left message on voicemail for pt and daughter with these instructions.

## 2013-05-03 NOTE — H&P (Signed)
History and Physical    Randall [name] NWG:[id number] DOB: 1940-12-28 DOA: 05/03/2013  Referring physician: Dr. Zenia Resides PCP: Georgetta Haber, MD  Specialists: oncology, Dr. Benay Spice   Chief Complaint: abdominal pain  HPI: Randall [name] is a 73 y.o. male has a past medical history significant for pancreatic cancer, seen by Dr. Learta Codding, presents to the emergency room with a chief complaint of abdominal pain, progressively worse in the last 2 days, exacerbated by food. He states that every time he tries to eat his abdominal pain gets worse, and doesn't go away with his oral home medications. He endorses nausea with eating, denies any vomiting. He denies any chest pain. Has no shortness of breath. Has no lightheadedness or dizziness. His cancer seems to be progressing despite chemotherapy, and he last saw Dr. Learta Codding yesterday. He also has had progressive abdominal distention secondary to ascites, and required so far about 4 paracentesis, most recent one done yesterday. He has no fever or chills.   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 Learta Codding that was discontinued.   Review of Systems: As per history of present illness, otherwise negative  Past Medical History  Diagnosis Date  . Anxiety   . Pancreatic cancer metastasized to liver   . MI (myocardial infarction)   . HTN (hypertension)   . Right rotator cuff tear   . Diverticulosis   . Malignant neoplasm of pancreas, part unspecified 07/18/2012   Past Surgical History  Procedure Laterality Date  . Cardiac catheterization    . Coronary stent placement    . [place] thora/paracentesis  05/02/2013   Social History:  reports that he quit smoking about 8 years ago. His smoking use included Cigarettes and Cigars. He smoked 0.00 packs per day for 55 years. He has never used smokeless tobacco. He reports that he does not drink alcohol or use illicit  drugs.  Allergies  Allergen Reactions  . Gemzar [Gemcitabine] Shortness Of Breath    pneumonitis    Family History  Problem Relation Age of Onset  . Cancer Mother     oropharengial  . Heart attack Father   . Ovarian cancer Sister   . Anuerysm Brother     AAA   Prior to Admission medications   Medication Sig Start Date End Date Taking? Authorizing Provider  ALPRAZolam Duanne Moron) 0.5 MG tablet Take 1 tablet (0.5 mg total) by mouth 3 (three) times daily as needed for anxiety. 03/23/13  Yes Ricard Dillon, MD  aspirin EC 81 MG tablet Take 81 mg by mouth daily.   Yes Historical Provider, MD  atorvastatin (LIPITOR) 80 MG tablet Take 1 tablet (80 mg total) by mouth daily at 6 PM. 04/12/13  Yes Sherren Mocha, MD  carvedilol (COREG) 6.25 MG tablet Take 6.25 mg by mouth 2 (two) times daily with a meal.   Yes Historical Provider, MD  citalopram (CELEXA) 20 MG tablet Take 1 tablet (20 mg total) by mouth daily. 12/13/12  Yes Ricard Dillon, MD  docusate sodium (COLACE) 100 MG capsule Take 100 mg by mouth 2 (two) times daily.    Yes Historical Provider, MD  enoxaparin (LOVENOX) 60 MG/0.6ML injection Inject 60 mg into the skin daily.   Yes Historical Provider, MD  furosemide (LASIX) 20 MG tablet Take 0.5 tablets (10 mg total) by mouth 2 (two) times daily. 05/02/13  Yes Ladell Pier, MD  lidocaine-prilocaine (EMLA)  cream Apply topically as needed. To PAC prn 07/13/12  Yes Historical Provider, MD  magnesium citrate SOLN Take 296 mLs by mouth once.   Yes Historical Provider, MD  morphine (MS CONTIN) 15 MG 12 hr tablet Take 15 mg by mouth 2 (two) times daily. 04/26/13 05/26/13 Yes Historical Provider, MD  morphine (MSIR) 15 MG tablet Take 1-2 tablets (15-30 mg total) by mouth every 4 (four) hours as needed for severe pain. 04/25/13  Yes Ladene Artist, MD  nitroGLYCERIN (NITROSTAT) 0.4 MG SL tablet Place 1 tablet (0.4 mg total) under the tongue every 5 (five) minutes x 3 doses as needed for chest pain. 06/19/12   Yes Ricki Rodriguez, MD  oxyCODONE (OXY IR/ROXICODONE) 5 MG immediate release tablet Take 1-2 tablets (5-10 mg total) by mouth every 6 (six) hours as needed for severe pain. 05/02/13  Yes Ladene Artist, MD  polyethylene glycol [facility] / GLYCOLAX) packet Take 17 g by mouth daily as needed.   Yes Historical Provider, MD  PRESCRIPTION MEDICATION PACLitaxel-protein bound (ABRAXANE) chemo infusion 175 mg 100 mg/m2  1.85 m2 (Treatment Plan Actual)  Once 04/04/2013  [facility]   Yes Historical Provider, MD  prochlorperazine (COMPAZINE) 10 MG tablet Take 1 tablet (10 mg total) by mouth every 6 (six) hours as needed (nausea). 07/13/12  Yes Ladene Artist, MD  ramipril (ALTACE) 1.25 MG capsule Take 1 capsule (1.25 mg total) by mouth daily. 04/12/13  Yes Tonny Bollman, MD  spironolactone (ALDACTONE) 25 MG tablet Take 1 tablet (25 mg total) by mouth daily. 05/02/13  Yes Ladene Artist, MD   Physical Exam: Filed Vitals:   05/03/13 1217  BP: 132/53  Pulse: 68  Temp: 97.8 F (36.6 C)  TempSrc: Oral  Resp: 20  SpO2: 97%     General:  No apparent distress  Eyes: PERRL, EOMI, mild scleral icterus  ENT: moist oropharynx  Neck: supple, no JVD  Cardiovascular: regular rate without MRG; 2+ peripheral pulses  Respiratory: CTA biL, good air movement without wheezing, rhonchi or crackled  Abdomen: soft, very tender to palpation in the epigastric area, tender RUQ RLQ.  Skin: no rashes  Musculoskeletal: no peripheral edema  Psychiatric: normal mood and affect  Neurologic: grossly non focal  Labs on Admission:  Basic Metabolic Panel:  Recent Labs Lab 05/03/13 1255  NA 132*  K 4.7  CL 92*  CO2 24  GLUCOSE 125*  BUN 31*  CREATININE 1.04  CALCIUM 9.0   Liver Function Tests:  Recent Labs Lab 05/03/13 1255  AST 45*  ALT 33  ALKPHOS 228*  BILITOT 1.7*  PROT 6.7  ALBUMIN 3.2*    Recent Labs Lab 05/03/13 1255  LIPASE 22   CBC:  Recent Labs Lab 05/03/13 1255  WBC 11.3*    NEUTROABS 9.3*  HGB 9.0*  HCT 26.4*  MCV 86.3  PLT 232   BNP (last 3 results)  Recent Labs  08/16/12 0500 08/17/12 1525 08/18/12 0457  PROBNP 2332.0* 4896.0* 4718.0*   Radiological Exams on Admission: US Paracentesis  05/02/2013   CLINICAL DATA:  Metastatic pancreatic carcinoma, recurrent ascites, abdominal distention. Request is made for therapeutic paracentesis.  EXAM: ULTRASOUND GUIDED THERAPEUTIC PARACENTESIS  COMPARISON:  PREVIOUS PARACENTESIS ON 04/27/2013.  PROCEDURE: An ultrasound guided paracentesis was thoroughly discussed with the patient and questions answered. The benefits, risks, alternatives and complications were also discussed. The patient understands and wishes to proceed with the procedure. Written consent was obtained.  Ultrasound was performed to localize and mark  an adequate pocket of fluid in the left lower quadrant of the abdomen. The area was then prepped and draped in the normal sterile fashion. 1% Lidocaine was used for local anesthesia. Under ultrasound guidance a 19 gauge Yueh catheter was introduced. Paracentesis was performed. The catheter was removed and a dressing applied.  Complications: None.  FINDINGS: A total of approximately 2.4 liters of amber fluid was removed.  IMPRESSION: Successful ultrasound guided therapeutic paracentesis yielding 2.4 liters of ascites.  Read by: Rowe Robert ,P.A.-C.   Electronically Signed   By: Aletta Edouard M.D.   On: 05/02/2013 14:31   Dg Abd Acute W/chest  05/03/2013   CLINICAL DATA:  Abdominal pain.  History of pancreatic cancer.  EXAM: ACUTE ABDOMEN SERIES (ABDOMEN 2 VIEW & CHEST 1 VIEW)  COMPARISON:  Chest 09/28/2012  FINDINGS: Port-A-Cath tip in the SVC unchanged. Lungs remain clear without infiltrate mass or edema. No effusion.  Normal bowel gas pattern without obstruction or free air. Lumbar degenerative changes. No acute bony abnormality.  IMPRESSION: Negative abdominal radiographs.  No acute cardiopulmonary disease.    Electronically Signed   By: Franchot Gallo M.D.   On: 05/03/2013 14:00   Assessment/Plan Principal Problem:   Intractable abdominal pain Active Problems:   Pancreatic mass   Malignant neoplasm of pancreas, part unspecified   Protein-calorie malnutrition, severe   Abdominal pain   CAD (coronary artery disease)   Anemia   Ascites   Abdominal pain  - likely due to progression of his cancer vs progression of his thrombosis vs less likely infection post paracentesis. I discussed with Dr. Benay Spice and he is aware that patient has been hospitalized. Will start Lovenox in therapeutic doses for his thrombosis and repeat a CT scan abdomen pelvis with contrast to further evaluate his abdominal pain and monitor his cancer progress. - NPO for now, IVF, pain control with IV medications.  - appears a bit dehydrated with high BUN/Cr ratio. Metastatic pancreatic cancer - CT AP for further evaluation. Pain control. Patient with mild T bili elevation and Alk Phos elevation. Continue to monitor. Dr. Benay Spice will discuss this further with the patient in the morning.  CAD s/p stent 12 months ago - stable, no chest pain, just finished his Brilinta. No chest pain currently. Obtain an admission EKG as it hasn't been done in the ED. Hold home medications for tonight, will restart in am if he will be on clears.  Protein calorie malnutrition, severe - NPO for now, IVF.  Anemia - in the setting of #2, at baseline Abdominal distention - improved today, s/p paracentesis yesterday.   Diet: NPO Fluids: NS DVT Prophylaxis: Lovenox A/C  Code Status: DNR  Family Communication: daughter and wife at bedside  Disposition Plan: inpatient, home when ready   Time spent: 26  This note has been created with Surveyor, quantity. Any transcriptional errors are unintentional.   Costin M. Cruzita Lederer, MD [facility] Pager [phone]  If 7PM-7AM, please contact  night-coverage [email or web address] Password [facility] 05/03/2013, 3:44 PM

## 2013-05-03 NOTE — ED Notes (Signed)
Pt states he urinated when he first arrived and is currently unable to go at this time.  Pt given urinal and family informed to ring call bell when ready. RN notified

## 2013-05-03 NOTE — ED Provider Notes (Signed)
CSN: [id number]     Arrival date & time 05/03/13  1201 History   First MD Initiated Contact with Patient 05/03/13 1242     Chief Complaint  Patient presents with  . Emesis     (Consider location/radiation/quality/duration/timing/severity/associated sxs/prior Treatment) Patient is a 73 y.o. male presenting with vomiting. The history is provided by the patient.  Emesis  patient here complaining of vomiting with nausea anorexia x3 days. No fever or chills. Has diffuse abdominal discomfort which is chronic for him due to his pancreatic cancer. He has been using his pain meds appropriately. Have a large bowel movement today. Had a paracentesis yesterday. Called her physician and was told to come here for evaluation. Symptoms have been persistent and nothing makes it better or worse.  Past Medical History  Diagnosis Date  . Anxiety   . Pancreatic cancer metastasized to liver   . MI (myocardial infarction)   . HTN (hypertension)   . Right rotator cuff tear   . Diverticulosis   . Malignant neoplasm of pancreas, part unspecified 07/18/2012   Past Surgical History  Procedure Laterality Date  . Cardiac catheterization    . Coronary stent placement     Family History  Problem Relation Age of Onset  . Cancer Mother     oropharengial  . Heart attack Father   . Ovarian cancer Sister   . Anuerysm Brother     AAA   History  Substance Use Topics  . Smoking status: Former Smoker -- 55 years    Types: Cigarettes, Cigars    Quit date: 03/03/2005  . Smokeless tobacco: Never Used     Comment: pt quit cigarettes in 2005 but still smoke cigars  . Alcohol Use: No     Comment: quit 06/16/2012    Review of Systems  Gastrointestinal: Positive for vomiting.  All other systems reviewed and are negative.      Allergies  Gemzar  Home Medications   Current Outpatient Rx  Name  Route  Sig  Dispense  Refill  . ALPRAZolam (XANAX) 0.5 MG tablet   Oral   Take 1 tablet (0.5 mg total) by mouth  3 (three) times daily as needed for anxiety.   90 tablet   0   . aspirin EC 81 MG tablet   Oral   Take 81 mg by mouth daily.         Marland Kitchen atorvastatin (LIPITOR) 80 MG tablet   Oral   Take 1 tablet (80 mg total) by mouth daily at 6 PM.   30 tablet   11   . carvedilol (COREG) 6.25 MG tablet   Oral   Take 6.25 mg by mouth 2 (two) times daily with a meal.         . citalopram (CELEXA) 20 MG tablet   Oral   Take 1 tablet (20 mg total) by mouth daily.   30 tablet   11   . docusate sodium (COLACE) 100 MG capsule   Oral   Take 100 mg by mouth 2 (two) times daily.          Marland Kitchen enoxaparin (LOVENOX) 60 MG/0.6ML injection   Subcutaneous   Inject 60 mg into the skin daily.         . furosemide (LASIX) 20 MG tablet   Oral   Take 0.5 tablets (10 mg total) by mouth 2 (two) times daily.   60 tablet   1   . lidocaine-prilocaine (EMLA) cream   Topical  Apply topically as needed. To PAC prn         . magnesium citrate SOLN   Oral   Take 296 mLs by mouth once.         . morphine (MS CONTIN) 15 MG 12 hr tablet   Oral   Take 15 mg by mouth 2 (two) times daily.         Marland Kitchen morphine (MSIR) 15 MG tablet   Oral   Take 1-2 tablets (15-30 mg total) by mouth every 4 (four) hours as needed for severe pain.   30 tablet   0   . nitroGLYCERIN (NITROSTAT) 0.4 MG SL tablet   Sublingual   Place 1 tablet (0.4 mg total) under the tongue every 5 (five) minutes x 3 doses as needed for chest pain.   25 tablet   0   . oxyCODONE (OXY IR/ROXICODONE) 5 MG immediate release tablet   Oral   Take 1-2 tablets (5-10 mg total) by mouth every 6 (six) hours as needed for severe pain.   75 tablet   0   . polyethylene glycol (MIRALAX / GLYCOLAX) packet   Oral   Take 17 g by mouth daily as needed.         Marland Kitchen PRESCRIPTION MEDICATION      PACLitaxel-protein bound (ABRAXANE) chemo infusion 175 mg 100 mg/m2  1.85 m2 (Treatment Plan Actual)  Once 04/04/2013  [facility]         . prochlorperazine  (COMPAZINE) 10 MG tablet   Oral   Take 1 tablet (10 mg total) by mouth every 6 (six) hours as needed (nausea).   60 tablet   1   . ramipril (ALTACE) 1.25 MG capsule   Oral   Take 1 capsule (1.25 mg total) by mouth daily.   30 capsule   11   . spironolactone (ALDACTONE) 25 MG tablet   Oral   Take 1 tablet (25 mg total) by mouth daily.   60 tablet   1    BP 132/53  Pulse 68  Temp(Src) 97.8 F (36.6 C) (Oral)  Resp 20  SpO2 97% Physical Exam  Nursing note and vitals reviewed. Constitutional: He is oriented to person, place, and time. He appears well-developed and well-nourished.  Non-toxic appearance. No distress.  HENT:  Head: Normocephalic and atraumatic.  Eyes: Conjunctivae, EOM and lids are normal. Pupils are equal, round, and reactive to light.  Neck: Normal range of motion. Neck supple. No tracheal deviation present. No mass present.  Cardiovascular: Normal rate, regular rhythm and normal heart sounds.  Exam reveals no gallop.   No murmur heard. Pulmonary/Chest: Effort normal and breath sounds normal. No stridor. No respiratory distress. He has no decreased breath sounds. He has no wheezes. He has no rhonchi. He has no rales.  Abdominal: Soft. Normal appearance and bowel sounds are normal. He exhibits no distension. There is generalized tenderness. There is no rigidity, no rebound, no guarding and no CVA tenderness.  Musculoskeletal: Normal range of motion. He exhibits no edema and no tenderness.  Neurological: He is alert and oriented to person, place, and time. He has normal strength. No cranial nerve deficit or sensory deficit. GCS eye subscore is 4. GCS verbal subscore is 5. GCS motor subscore is 6.  Skin: Skin is warm and dry. No abrasion and no rash noted.  Psychiatric: He has a normal mood and affect. His speech is normal and behavior is normal.    ED Course  Procedures (including critical  care time) Labs Review Labs Reviewed  CBC WITH DIFFERENTIAL   COMPREHENSIVE METABOLIC PANEL  LIPASE, BLOOD  URINALYSIS, ROUTINE W REFLEX MICROSCOPIC   Imaging Review US Paracentesis  05/02/2013   CLINICAL DATA:  Metastatic pancreatic carcinoma, recurrent ascites, abdominal distention. Request is made for therapeutic paracentesis.  EXAM: ULTRASOUND GUIDED THERAPEUTIC PARACENTESIS  COMPARISON:  PREVIOUS PARACENTESIS ON 04/27/2013.  PROCEDURE: An ultrasound guided paracentesis was thoroughly discussed with the patient and questions answered. The benefits, risks, alternatives and complications were also discussed. The patient understands and wishes to proceed with the procedure. Written consent was obtained.  Ultrasound was performed to localize and mark an adequate pocket of fluid in the left lower quadrant of the abdomen. The area was then prepped and draped in the normal sterile fashion. 1% Lidocaine was used for local anesthesia. Under ultrasound guidance a 19 gauge Yueh catheter was introduced. Paracentesis was performed. The catheter was removed and a dressing applied.  Complications: None.  FINDINGS: A total of approximately 2.4 liters of amber fluid was removed.  IMPRESSION: Successful ultrasound guided therapeutic paracentesis yielding 2.4 liters of ascites.  Read by: Rowe Robert ,P.A.-C.   Electronically Signed   By: Aletta Edouard M.D.   On: 05/02/2013 14:31     EKG Interpretation None      MDM   Final diagnoses:  None    Patient given IV fluids and pain medication. Patient's abdomen is nonsurgical at this time. He continues to have discomfort will be admitted for pain control    Leota Jacobsen, MD 05/03/13 [phone]

## 2013-05-03 NOTE — Progress Notes (Signed)
Utilization Review completed.  Vannary Greening RN CM  

## 2013-05-04 DIAGNOSIS — C787 Secondary malignant neoplasm of liver and intrahepatic bile duct: Secondary | ICD-10-CM

## 2013-05-04 DIAGNOSIS — D702 Other drug-induced agranulocytosis: Secondary | ICD-10-CM

## 2013-05-04 DIAGNOSIS — K55059 Acute (reversible) ischemia of intestine, part and extent unspecified: Secondary | ICD-10-CM

## 2013-05-04 DIAGNOSIS — K869 Disease of pancreas, unspecified: Secondary | ICD-10-CM

## 2013-05-04 DIAGNOSIS — C259 Malignant neoplasm of pancreas, unspecified: Principal | ICD-10-CM

## 2013-05-04 DIAGNOSIS — C800 Disseminated malignant neoplasm, unspecified: Secondary | ICD-10-CM

## 2013-05-04 DIAGNOSIS — D6959 Other secondary thrombocytopenia: Secondary | ICD-10-CM

## 2013-05-04 DIAGNOSIS — I10 Essential (primary) hypertension: Secondary | ICD-10-CM

## 2013-05-04 DIAGNOSIS — I81 Portal vein thrombosis: Secondary | ICD-10-CM

## 2013-05-04 DIAGNOSIS — I77811 Abdominal aortic ectasia: Secondary | ICD-10-CM

## 2013-05-04 DIAGNOSIS — C250 Malignant neoplasm of head of pancreas: Secondary | ICD-10-CM

## 2013-05-04 DIAGNOSIS — J910 Malignant pleural effusion: Secondary | ICD-10-CM

## 2013-05-04 LAB — CBC
HCT: 23.9 % — ABNORMAL LOW (ref 39.0–52.0)
HCT: 25.7 % — ABNORMAL LOW (ref 39.0–52.0)
HEMOGLOBIN: 7.6 g/dL — AB (ref 13.0–17.0)
Hemoglobin: 8.2 g/dL — ABNORMAL LOW (ref 13.0–17.0)
MCH: 28.1 pg (ref 26.0–34.0)
MCH: 28.4 pg (ref 26.0–34.0)
MCHC: 31.8 g/dL (ref 30.0–36.0)
MCHC: 31.9 g/dL (ref 30.0–36.0)
MCV: 88.5 fL (ref 78.0–100.0)
MCV: 88.9 fL (ref 78.0–100.0)
PLATELETS: 176 10*3/uL (ref 150–400)
Platelets: 137 10*3/uL — ABNORMAL LOW (ref 150–400)
RBC: 2.7 MIL/uL — AB (ref 4.22–5.81)
RBC: 2.89 MIL/uL — ABNORMAL LOW (ref 4.22–5.81)
RDW: 16.5 % — ABNORMAL HIGH (ref 11.5–15.5)
RDW: 16.4 % — ABNORMAL HIGH (ref 11.5–15.5)
WBC: 6.6 10*3/uL (ref 4.0–10.5)
WBC: 8.9 10*3/uL (ref 4.0–10.5)

## 2013-05-04 LAB — COMPREHENSIVE METABOLIC PANEL
ALK PHOS: 199 U/L — AB (ref 39–117)
ALT: 25 U/L (ref 0–53)
AST: 38 U/L — ABNORMAL HIGH (ref 0–37)
Albumin: 2.8 g/dL — ABNORMAL LOW (ref 3.5–5.2)
BUN: 28 mg/dL — ABNORMAL HIGH (ref 6–23)
CALCIUM: 8.4 mg/dL (ref 8.4–10.5)
CO2: 21 meq/L (ref 19–32)
Chloride: 99 mEq/L (ref 96–112)
Creatinine, Ser: 0.94 mg/dL (ref 0.50–1.35)
GFR calc Af Amer: >90 mL/min (ref >90)
GFR, EST NON AFRICAN AMERICAN: 81 mL/min — AB (ref >90)
Glucose, Bld: 82 mg/dL (ref 70–99)
POTASSIUM: 4.5 meq/L (ref 3.7–5.3)
SODIUM: 134 meq/L — AB (ref 137–147)
TOTAL PROTEIN: 6.1 g/dL (ref 6.0–8.3)
Total Bilirubin: 1 mg/dL (ref 0.3–1.2)

## 2013-05-04 LAB — PHOSPHORUS: Phosphorus: 3.1 mg/dL (ref 2.3–4.6)

## 2013-05-04 LAB — MAGNESIUM: Magnesium: 2.3 mg/dL (ref 1.5–2.5)

## 2013-05-04 MED ORDER — HYDROMORPHONE HCL PF 1 MG/ML IJ SOLN
1.0000 mg | INTRAMUSCULAR | Status: DC | PRN
  Administered 2013-05-04: 1 mg via INTRAVENOUS
  Administered 2013-05-04 – 2013-05-06 (×7): 2 mg via INTRAVENOUS
  Filled 2013-05-04 (×5): qty 2
  Filled 2013-05-04: qty 1
  Filled 2013-05-04 (×2): qty 2

## 2013-05-04 MED ORDER — SODIUM CHLORIDE 0.9 % IJ SOLN
10.0000 mL | INTRAMUSCULAR | Status: DC | PRN
  Administered 2013-05-04 – 2013-05-06 (×2): 10 mL

## 2013-05-04 NOTE — Progress Notes (Signed)
IP PROGRESS NOTE  Subjective:   He was admitted yesterday with severe abdominal pain. He reports the pain medication lasts for only 2 hours. No nausea at present. He underwent a paracentesis 05/02/2013.  Objective: Vital signs in last 24 hours: Blood pressure 108/60, pulse 58, temperature 98.3 F (36.8 C), temperature source Oral, resp. rate 18, height 5\' 8"  (1.727 m), weight 146 lb 11.2 oz (66.543 kg), SpO2 95.00%.  Intake/Output from previous day:    Physical Exam:  Lungs: Lungs clear bilaterally Cardiac: Regular rate and rhythm Abdomen: Mildly distended, no mass Extremities: No leg edema   Portacath/PICC-without erythema  Lab Results:  Recent Labs  05/03/13 1255 05/04/13 0605  WBC 11.3* 6.6  HGB 9.0* 7.6*  HCT 26.4* 23.9*  PLT 232 137*    BMET  Recent Labs  05/03/13 1255 05/04/13 0605  NA 132* 134*  K 4.7 4.5  CL 92* 99  CO2 24 21  GLUCOSE 125* 82  BUN 31* 28*  CREATININE 1.04 0.94  CALCIUM 9.0 8.4    Studies/Results: Ct Abdomen Pelvis W Contrast  05/03/2013   CLINICAL DATA:  Abdominal pain and distention. Pancreatic cancer. Chemotherapy completed 04/04/2013.  EXAM: CT ABDOMEN AND PELVIS WITH CONTRAST  TECHNIQUE: Multidetector CT imaging of the abdomen and pelvis was performed using the standard protocol following bolus administration of intravenous contrast.  CONTRAST:  151mL OMNIPAQUE IOHEXOL 300 MG/ML  SOLN  COMPARISON:  03/28/2013 CT.  FINDINGS: Interval progression of tumor. Primary pancreatic mass and surrounding adenopathy have progressed since the prior examination as has the size of multiple hepatic metastatic lesions. As an index hepatic metastatic lesion, right lobe 3.9 x 3.5 cm lesion previously measured 2.4 x 2.8 cm.  Progressive thrombosis of the splenic vein, portal veins and superior mesenteric vein.  Progressive ascites.  No free intraperitoneal air or definitive findings to suggest pneumatosis. Scattered diverticula. Limited for evaluating  bowel inflammatory process given the ascites.  Gallbladder sludge//stones. Limited for detecting cholecystitis given the ascites.  Prominent atherosclerotic type changes abdominal aorta with irregular aneurysmal dilation similar to prior exam. Atherosclerotic type changes with ectasia narrowing involving common iliac arteries.  Splenomegaly spanning over 13.1 cm.  No hydronephrosis or worrisome focal renal or adrenal lesion.  L5 pars defects with grade 1 anterior slippage of L5 with spinal stenosis and foraminal narrowing.  Lung base nodules more notable on the right without significant change.  IMPRESSION: Interval progression of tumor. Primary pancreatic mass and surrounding adenopathy have progressed since the prior examination as has the size of multiple hepatic metastatic lesions. As an index hepatic metastatic lesion, right lobe 3.9 x 3.5 cm lesion previously measured 2.4 x 2.8 cm.  Progressive thrombosis of the splenic vein, portal veins and superior mesenteric vein.  Progressive ascites.  Gallbladder sludge//stones.  Prominent atherosclerotic type changes abdominal aorta with irregular aneurysmal dilation similar to prior exam.  Splenomegaly spanning over 13.1 cm.  Lung base nodules more notable on the right without significant change.  Please see above.  These results will be called to the ordering clinician or representative by the Radiologist Assistant, and communication documented in the PACS Dashboard. .   Electronically Signed   By: Chauncey Cruel M.D.   On: 05/03/2013 18:53   US Paracentesis  05/02/2013   CLINICAL DATA:  Metastatic pancreatic carcinoma, recurrent ascites, abdominal distention. Request is made for therapeutic paracentesis.  EXAM: ULTRASOUND GUIDED THERAPEUTIC PARACENTESIS  COMPARISON:  PREVIOUS PARACENTESIS ON 04/27/2013.  PROCEDURE: An ultrasound guided paracentesis was thoroughly discussed with  the patient and questions answered. The benefits, risks, alternatives and complications  were also discussed. The patient understands and wishes to proceed with the procedure. Written consent was obtained.  Ultrasound was performed to localize and mark an adequate pocket of fluid in the left lower quadrant of the abdomen. The area was then prepped and draped in the normal sterile fashion. 1% Lidocaine was used for local anesthesia. Under ultrasound guidance a 19 gauge Yueh catheter was introduced. Paracentesis was performed. The catheter was removed and a dressing applied.  Complications: None.  FINDINGS: A total of approximately 2.4 liters of amber fluid was removed.  IMPRESSION: Successful ultrasound guided therapeutic paracentesis yielding 2.4 liters of ascites.  Read by: Rowe Robert ,P.A.-C.   Electronically Signed   By: Aletta Edouard M.D.   On: 05/02/2013 14:31   Dg Abd Acute W/chest  05/03/2013   CLINICAL DATA:  Abdominal pain.  History of pancreatic cancer.  EXAM: ACUTE ABDOMEN SERIES (ABDOMEN 2 VIEW & CHEST 1 VIEW)  COMPARISON:  Chest 09/28/2012  FINDINGS: Port-A-Cath tip in the SVC unchanged. Lungs remain clear without infiltrate mass or edema. No effusion.  Normal bowel gas pattern without obstruction or free air. Lumbar degenerative changes. No acute bony abnormality.  IMPRESSION: Negative abdominal radiographs.  No acute cardiopulmonary disease.   Electronically Signed   By: Franchot Gallo M.D.   On: 05/03/2013 14:00    Medications: I have reviewed the patient's current medications.  Assessment/Plan: 1. Metastatic pancreas cancer. The clinical presentation, elevated CA 19-9 and abdominal CT consistent with a diagnosis of metastatic pancreas cancer. 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 Initiation of gemcitabine/Abraxane on 07/21/2012. Last treated on 08/04/2012.  The CA 19-9 was improved on 09/01/2012 following 3 treatments with gemcitabine/Abraxane.  Restaging CT evaluation  09/29/2012 showed interval decrease in the left lower lobe pulmonary nodule; interval decrease in the size of the pancreatic head mass with decreasing regional lymphadenopathy and decreasing metastatic disease within the liver. No new or progressive disease noted.  CA 19-9 40775 on 07/07/2012, 1631 on 09/01/2012 and 2105 on 09/29/2012, ~2292 on 10/26/2012, ~ 5765 on 11/23/2012, ~5720 on 12/07/2012, 8383 on 01/04/2013.  Cycle 1 FOLFOX 10/12/2012, cycle 2 10/26/2012, cycle 3 11/23/2012, cycle 4 12/07/2012, cycle 5 12/21/2012, cycle 6 01/04/2013.  Restaging CT evaluation 01/17/2013 showed multiple new parenchymal and subpleural nodules, nonspecific; increased pancreatic head mass/regional adenopathy; mixed response in the liver.  Initiation of Abraxane 01/18/2013.  03/07/2013 CA 19-9 mildly increased from previous value; further increased on 03/14/2013.  CA 19-9 improved on 03/21/2013.  Restaging CT 03/31/2013 with a mixed response in the liver, worsening of portal vein thrombosis.  CA 19-9 increased 04/20/2013.  Malignant ascites status post paracentesis 04/20/2013 with cytology showing malignant cells consistent with metastatic adenocarcinoma. 2. Pain secondary to #1.  3. Acute myocardial infarction 06/16/2012 status post PTCA/drug-eluting stent, maintained on Brilinta. 4. Status post Port-A-Cath placement 07/16/2012. 5. Admission with high fever and chills on 08/11/2012. CT of the chest revealed multilobar infiltrates. He was diagnosed with chemotherapy induced pneumonitis. He improved with steroids. Chest x-ray 09/15/2012 showed continued improvement. 6. Mucositis following cycle 4 FOLFOX. The 5-FU was dose reduced beginning with cycle 5. 7. Thrombocytopenia secondary to chemotherapy. The oxaliplatin was dose reduced beginning with cycle 5 FOLFOX. 8. History of neutropenia secondary to chemotherapy 9. Intermittent nose bleeding-likely related to mucositis from chemotherapy and  aspirin/brilinta 10. 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 11. 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 Progression noted on the CT 05/03/2013 12. Distended abdomen with increased pain-? Secondary to progression of the pancreas cancer with carcinomatosis versus portal hypertension. Paracentesis 04/20/2013 with cytology confirming malignant cells consistent with metastatic adenocarcinoma.  He was admitted with severe abdominal pain. The pain is most likely secondary to progression of the pancreas tumor with carcinomatosis. The pain could also be in part related to the portal/superior mesenteric vein thrombosis.   recommendations:  1. Lovenox anticoagulation 2. Narcotic analgesics-we can manage the pain with IV narcotics today and attempt switching to an oral regimen 05/05/2013 3. I discussed hospice care with Mr. Arron again this morning. He would like to consider this option once his pain is under better control. 4. Decrease IV fluids in the presence of ascites and advanced diet as tolerated  I appreciate the care from the hospitalist team.     LOS: 1 day   [facility], [place]  05/04/2013, 8:48 AM

## 2013-05-04 NOTE — Care Management Note (Unsigned)
    Page 1 of 1   05/04/2013     12:59:44 PM   CARE MANAGEMENT NOTE 05/04/2013  Patient:  Randall [name], Randall [name]   Account Number:  [id number]  Date Initiated:  05/04/2013  Documentation initiated by:  [facility]  Subjective/Objective Assessment:   73 year old male admitted with abdominal pain.     Action/Plan:   From home.   Anticipated DC Date:  05/07/2013   Anticipated DC Plan:  [place]  CM consult      Choice offered to / List presented to:             Status of service:  In process, will continue to follow Medicare Important Message given?  NA - LOS <3 / Initial given by admissions (If response is "NO", the following Medicare IM given date fields will be blank) Date Medicare IM given:   Date Additional Medicare IM given:    Discharge Disposition:    Per UR Regulation:  Reviewed for med. necessity/level of care/duration of stay  If discussed at [place] of Stay Meetings, dates discussed:    Comments:  05/04/13 Allene Dillon RN BSN Met with pt at bedside to discuss issues affording Lovenox. His copayment was over$600 when he went to pick it up fromt the pharmacy. I called pt's pharmacy [facility] on [address] and was informed that indeed his copayment was $619.00.  We did a benefits check and was informed that Lovenox is a tier 5 drug and pt is responsible for 33% cost of the drug. The alternative per the insurance company wold be Fragmin with the copayment of $95.00  Pt made aware of this and was also given information about [email or web address]

## 2013-05-04 NOTE — Progress Notes (Signed)
[facility] PROGRESS NOTE  RAIDER SISCO XLK:[id number] DOB: 29-Oct-1940 DOA: 05/03/2013 PCP: Carrie Mew, MD  Assessment/Plan: Abdominal pain  - likely due to progression of his cancer vs progression of his thrombosis vs less likely infection post paracentesis. Dr. Truett Perna to see. Therapeutic Lovenox ordered for SVC thrombosis. Pt reportedly was unable to afford med as outpatient. Will defer anticoagulant choice to Dr. Truett Perna. Case mgt consulted for med assistance - NPO for now, IVF, pain control with IV medications.  - appears a bit dehydrated with high BUN/Cr ratio.  Metastatic pancreatic cancer - Metastatic lesion increased in size nearly 1cm in diameter since previous CT in 2/15. Dr. Truett Perna to see.Marland Kitchen  CAD s/p stent 12 months ago - stable, no chest pain, just finished his Brilinta. No chest pain currently. Obtain an admission EKG as it hasn't been done in the ED. Consider resuming when resuming PO intake Protein calorie malnutrition, severe - NPO for now, IVF.  Anemia - in the setting of #2, at baseline  Abdominal distention - improved today, s/p recent paracentesis   Code Status: DNR Family Communication: Pt in room (indicate person spoken with, relationship, and if by phone, the number) Disposition Plan: Pending   Consultants:  Dr. Truett Perna  Procedures:    Antibiotics:    HPI/Subjective: No acute events noted overnight  Objective: Filed Vitals:   05/03/13 1553 05/03/13 1608 05/03/13 2155 05/04/13 0615  BP: 108/60 123/57 107/59 108/60  Pulse: 63 58 64 58  Temp:  97.5 F (36.4 C) 98.1 F (36.7 C) 98.3 F (36.8 C)  TempSrc:  Oral Oral Oral  Resp: 16 18 16 18   Height:  5\' 8"  (1.727 m)    Weight:  66.543 kg (146 lb 11.2 oz)    SpO2: 100% 98% 97% 95%    Intake/Output Summary (Last 24 hours) at 05/04/13 0849 Last data filed at 05/03/13 1729  Gross per 24 hour  Intake      0 ml  Output      0 ml  Net      0 ml   Filed Weights   05/03/13 1608   Weight: 66.543 kg (146 lb 11.2 oz)    Exam:   General:  Awake, in nad  Cardiovascular: regular, s1, s2  Respiratory: normal resp effort, no wheezing  Abdomen: soft, nondistended  Musculoskeletal: perfused, no clubbing   Data Reviewed: Basic Metabolic Panel:  Recent Labs Lab 05/03/13 1255 05/04/13 0605  NA 132* 134*  K 4.7 4.5  CL 92* 99  CO2 24 21  GLUCOSE 125* 82  BUN 31* 28*  CREATININE 1.04 0.94  CALCIUM 9.0 8.4  MG  --  2.3  PHOS  --  3.1   Liver Function Tests:  Recent Labs Lab 05/03/13 1255 05/04/13 0605  AST 45* 38*  ALT 33 25  ALKPHOS 228* 199*  BILITOT 1.7* 1.0  PROT 6.7 6.1  ALBUMIN 3.2* 2.8*    Recent Labs Lab 05/03/13 1255  LIPASE 22   No results found for this basename: AMMONIA,  in the last 168 hours CBC:  Recent Labs Lab 05/03/13 1255 05/04/13 0605  WBC 11.3* 6.6  NEUTROABS 9.3*  --   HGB 9.0* 7.6*  HCT 26.4* 23.9*  MCV 86.3 88.5  PLT 232 137*   Cardiac Enzymes: No results found for this basename: CKTOTAL, CKMB, CKMBINDEX, TROPONINI,  in the last 168 hours BNP (last 3 results)  Recent Labs  08/16/12 0500 08/17/12 1525 08/18/12 0457  PROBNP 2332.0* 4896.0* 4718.0*  CBG: No results found for this basename: GLUCAP,  in the last 168 hours  No results found for this or any previous visit (from the past 240 hour(s)).   Studies: Ct Abdomen Pelvis W Contrast  05/03/2013   CLINICAL DATA:  Abdominal pain and distention. Pancreatic cancer. Chemotherapy completed 04/04/2013.  EXAM: CT ABDOMEN AND PELVIS WITH CONTRAST  TECHNIQUE: Multidetector CT imaging of the abdomen and pelvis was performed using the standard protocol following bolus administration of intravenous contrast.  CONTRAST:  OMNIPAQUE IOHEXOL 300 MG/ML  SOLN  COMPARISON:  03/28/2013 CT.  FINDINGS: Interval progression of tumor. Primary pancreatic mass and surrounding adenopathy have progressed since the prior examination as has the size of multiple hepatic  metastatic lesions. As an index hepatic metastatic lesion, right lobe 3.9 x 3.5 cm lesion previously measured 2.4 x 2.8 cm.  Progressive thrombosis of the splenic vein, portal veins and superior mesenteric vein.  Progressive ascites.  No free intraperitoneal air or definitive findings to suggest pneumatosis. Scattered diverticula. Limited for evaluating bowel inflammatory process given the ascites.  Gallbladder sludge//stones. Limited for detecting cholecystitis given the ascites.  Prominent atherosclerotic type changes abdominal aorta with irregular aneurysmal dilation similar to prior exam. Atherosclerotic type changes with ectasia narrowing involving common iliac arteries.  Splenomegaly spanning over 13.1 cm.  No hydronephrosis or worrisome focal renal or adrenal lesion.  L5 pars defects with grade 1 anterior slippage of L5 with spinal stenosis and foraminal narrowing.  Lung base nodules more notable on the right without significant change.  IMPRESSION: Interval progression of tumor. Primary pancreatic mass and surrounding adenopathy have progressed since the prior examination as has the size of multiple hepatic metastatic lesions. As an index hepatic metastatic lesion, right lobe 3.9 x 3.5 cm lesion previously measured 2.4 x 2.8 cm.  Progressive thrombosis of the splenic vein, portal veins and superior mesenteric vein.  Progressive ascites.  Gallbladder sludge//stones.  Prominent atherosclerotic type changes abdominal aorta with irregular aneurysmal dilation similar to prior exam.  Splenomegaly spanning over 13.1 cm.  Lung base nodules more notable on the right without significant change.  Please see above.  These results will be called to the ordering clinician or representative by the Radiologist Assistant, and communication documented in the PACS Dashboard. .   Electronically Signed   By: Bridgett Larsson M.D.   On: 05/03/2013 18:53   US Paracentesis  05/02/2013   CLINICAL DATA:  Metastatic pancreatic  carcinoma, recurrent ascites, abdominal distention. Request is made for therapeutic paracentesis.  EXAM: ULTRASOUND GUIDED THERAPEUTIC PARACENTESIS  COMPARISON:  PREVIOUS PARACENTESIS ON 04/27/2013.  PROCEDURE: An ultrasound guided paracentesis was thoroughly discussed with the patient and questions answered. The benefits, risks, alternatives and complications were also discussed. The patient understands and wishes to proceed with the procedure. Written consent was obtained.  Ultrasound was performed to localize and mark an adequate pocket of fluid in the left lower quadrant of the abdomen. The area was then prepped and draped in the normal sterile fashion. 1% Lidocaine was used for local anesthesia. Under ultrasound guidance a 19 gauge Yueh catheter was introduced. Paracentesis was performed. The catheter was removed and a dressing applied.  Complications: None.  FINDINGS: A total of approximately 2.4 liters of amber fluid was removed.  IMPRESSION: Successful ultrasound guided therapeutic paracentesis yielding 2.4 liters of ascites.  Read by: Jeananne Rama ,P.A.-C.   Electronically Signed   By: Irish Lack M.D.   On: 05/02/2013 14:31   Dg Abd Acute W/chest  05/03/2013  CLINICAL DATA:  Abdominal pain.  History of pancreatic cancer.  EXAM: ACUTE ABDOMEN SERIES (ABDOMEN 2 VIEW & CHEST 1 VIEW)  COMPARISON:  Chest 09/28/2012  FINDINGS: Port-A-Cath tip in the SVC unchanged. Lungs remain clear without infiltrate mass or edema. No effusion.  Normal bowel gas pattern without obstruction or free air. Lumbar degenerative changes. No acute bony abnormality.  IMPRESSION: Negative abdominal radiographs.  No acute cardiopulmonary disease.   Electronically Signed   By: Marlan Palau M.D.   On: 05/03/2013 14:00    Scheduled Meds: . enoxaparin (LOVENOX) injection  100 mg Subcutaneous Q24H   Continuous Infusions: . sodium chloride 125 mL/hr at 05/03/13 1508  . sodium chloride 75 mL/hr at 05/03/13 1654    Principal  Problem:   Intractable abdominal pain Active Problems:   Pancreatic mass   Malignant neoplasm of pancreas, part unspecified   Protein-calorie malnutrition, severe   Abdominal pain   CAD (coronary artery disease)   Anemia   Ascites  Time spent:  Cosandra Plouffe K  [facility] Pager [phone]. If 7PM-7AM, please contact night-coverage at [email or web address], password [facility] 05/04/2013, 8:49 AM  LOS: 1 day

## 2013-05-04 NOTE — Progress Notes (Signed)
INITIAL NUTRITION ASSESSMENT  Pt meets criteria for severe MALNUTRITION in the context of chronic illness as evidenced by <75% estimated energy intake with 8% weight loss in the past week.  DOCUMENTATION CODES Per approved criteria  -Severe malnutrition in the context of chronic illness   INTERVENTION: - Diet advancement per MD - Recommend Ensure Complete TID when diet advanced - Will continue to monitor   NUTRITION DIAGNOSIS: Inadequate oral intake related to inability to eat as evidenced by NPO.   Goal: Advance diet as tolerated to regular diet   Monitor:  Weights, labs, diet advancement, abdominal pain  Reason for Assessment: Malnutrition screening tool   73 y.o. male  Admitting Dx: Intractable abdominal pain  ASSESSMENT: Pt with history significant for metastatic pancreatic cancer s/p chemotherapy and chemotherapy induced neuropathy, seen by Dr. Sherill, presents to the emergency room with a chief complaint of abdominal pain, progressively worse in the last 2 days, exacerbated by food. He states that every time he tries to eat his abdominal pain gets worse, and doesn't go away with his oral home medications. He endorses nausea with eating, denies any vomiting. He denies any chest pain. Has no shortness of breath. Has no lightheadedness or dizziness. His cancer seems to be progressing despite chemotherapy. He also has had progressive abdominal distention secondary to ascites, and required so far about 4 paracentesis, most recent one done 05/02/13.   Met with pt who reports not eating anything for 1 week PTA due to changes in taste, poor appetite, and abdominal pain. Has lost 13 pounds in the past week, however some of this could be from fluid losses from paracentesis. States before the past week, he was forcing himself to eat 3 small meals/day with 2 Ensure Complete/day. Denies any nausea/vomiting today.   Alk phos and AST elevated   Nutrition Focused Physical  Exam:  Subcutaneous Fat:  Orbital Region: WNL Upper Arm Region: mild/moderate wasting Thoracic and Lumbar Region: mild/moderate wasting  Muscle:  Temple Region: WNL Clavicle Bone Region: mild/moderate wasting Clavicle and Acromion Bone Region: mild/moderate wasting Scapular Bone Region: NA Dorsal Hand: WNL Patellar Region: WNL Anterior Thigh Region: WNL Posterior Calf Region: WNL  Edema: None noted    Height: Ht Readings from Last 1 Encounters:  05/03/13 5' 8" (1.727 m)    Weight: Wt Readings from Last 1 Encounters:  05/03/13 146 lb 11.2 oz (66.543 kg)    Ideal Body Weight: 154 lb   % Ideal Body Weight: 95%  Wt Readings from Last 10 Encounters:  05/03/13 146 lb 11.2 oz (66.543 kg)  05/02/13 155 lb 4 oz (70.421 kg)  04/27/13 159 lb 6.4 oz (72.303 kg)  04/21/13 163 lb 9.6 oz (74.208 kg)  04/20/13 163 lb 11.2 oz (74.254 kg)  03/28/13 165 lb (74.844 kg)  03/21/13 159 lb 12.8 oz (72.485 kg)  03/14/13 160 lb 1.6 oz (72.621 kg)  02/15/13 160 lb 9.6 oz (72.848 kg)  02/08/13 161 lb (73.029 kg)    Usual Body Weight: 159 lb the end of last month  % Usual Body Weight: 92%  BMI:  Body mass index is 22.31 kg/(m^2).  Estimated Nutritional Needs: Kcal: 1700-1900 Protein: 80-100g Fluid: 1.7-1.9L/day  Skin: Facial abrasion under right lip from shaving  Diet Order:  No diet entered   EDUCATION NEEDS: -No education needs identified at this time   Intake/Output Summary (Last 24 hours) at 05/04/13 1411 Last data filed at 05/04/13 1300  Gross per 24 hour  Intake        0 ml  Output      1 ml  Net     -1 ml    Last BM: 3/3  Labs:   Recent Labs Lab 05/03/13 1255 05/04/13 0605  NA 132* 134*  K 4.7 4.5  CL 92* 99  CO2 24 21  BUN 31* 28*  CREATININE 1.04 0.94  CALCIUM 9.0 8.4  MG  --  2.3  PHOS  --  3.1  GLUCOSE 125* 82    CBG (last 3)  No results found for this basename: GLUCAP,  in the last 72 hours  Scheduled Meds: . enoxaparin (LOVENOX)  injection  100 mg Subcutaneous Q24H    Continuous Infusions: . sodium chloride 125 mL/hr at 05/03/13 1508  . sodium chloride 75 mL/hr at 05/03/13 1654    Past Medical History  Diagnosis Date  . Anxiety   . Pancreatic cancer metastasized to liver   . MI (myocardial infarction)   . HTN (hypertension)   . Right rotator cuff tear   . Diverticulosis   . Malignant neoplasm of pancreas, part unspecified 07/18/2012    Past Surgical History  Procedure Laterality Date  . Cardiac catheterization    . Coronary stent placement    . Us thora/paracentesis  05/02/2013     Baron MS, RD, LDN [phone] Pager [phone] After Hours Pager  

## 2013-05-05 ENCOUNTER — Encounter: Payer: Self-pay | Admitting: *Deleted

## 2013-05-05 ENCOUNTER — Inpatient Hospital Stay (HOSPITAL_COMMUNITY): Payer: Medicare Other

## 2013-05-05 ENCOUNTER — Encounter: Payer: Self-pay | Admitting: Oncology

## 2013-05-05 DIAGNOSIS — D649 Anemia, unspecified: Secondary | ICD-10-CM

## 2013-05-05 DIAGNOSIS — F411 Generalized anxiety disorder: Secondary | ICD-10-CM

## 2013-05-05 MED ORDER — MORPHINE SULFATE ER 30 MG PO TBCR
30.0000 mg | EXTENDED_RELEASE_TABLET | Freq: Two times a day (BID) | ORAL | Status: DC
Start: 2013-05-05 — End: 2013-05-06
  Administered 2013-05-05 – 2013-05-06 (×3): 30 mg via ORAL
  Filled 2013-05-05 (×3): qty 1

## 2013-05-05 MED ORDER — ONDANSETRON HCL 4 MG/2ML IJ SOLN
4.0000 mg | Freq: Four times a day (QID) | INTRAMUSCULAR | Status: DC | PRN
  Administered 2013-05-05: 4 mg via INTRAVENOUS
  Filled 2013-05-05: qty 2

## 2013-05-05 MED ORDER — MORPHINE SULFATE 15 MG PO TABS
15.0000 mg | ORAL_TABLET | ORAL | Status: DC | PRN
  Administered 2013-05-06: 30 mg via ORAL
  Filled 2013-05-05: qty 2

## 2013-05-05 NOTE — Progress Notes (Signed)
Patient's daughter brought a lovenox application for MD to fill out; the patient has insurance and Person never tried to fill the prescription.  Before the [employer] processes the application I need to see how high the patient's portion is; lmovm asking the nurse to send a prescription to [facility].

## 2013-05-05 NOTE — Progress Notes (Signed)
Notified by [facility] Rod Holler pt/family request for services of [facility]osp[facility]) after discharge. Spoke with Daughter Lauren via phone and pt at bedside. [facility] will follow up with pt/family once home; no DME needs at this time; per pt/dtr -pt will d/c to ex-wife's home -address: [place], [place] until he feels strong enough to go to his own home- [facility] aware of address and contact numbers. Note pt is currently a DNR after discussion with Dr Benay Spice- please send completed GOLD DNR form home with pt Please call with any questions  Danton Sewer, RN [phone]

## 2013-05-05 NOTE — Progress Notes (Signed)
Received referral to refer pt to home hospice. I met with him at the bedside to discuss this and offer him choice. He would like to use [facility] [place]. Referral made.  Allene Dillon RN BSN  [phone]

## 2013-05-05 NOTE — Evaluation (Signed)
Clinical/Bedside Swallow Evaluation Patient Details  Name: Randall [name] MRN: [id number] Date of Birth: [birth year]  Today's Date: 05/05/2013 Time: 0930-0955 SLP Time Calculation (min): 25 min  Past Medical History:  Past Medical History  Diagnosis Date  . Anxiety   . Pancreatic cancer metastasized to liver   . MI (myocardial infarction)   . HTN (hypertension)   . Right rotator cuff tear   . Diverticulosis   . Malignant neoplasm of pancreas, part unspecified 07/18/2012   Past Surgical History:  Past Surgical History  Procedure Laterality Date  . Cardiac catheterization    . Coronary stent placement    . [place] thora/paracentesis  05/02/2013   HPI:  Randall [name] adm to [facility] with intractable N/V and abdomen pain.  Randall [name] has pancreatic cancer and ascities s/p paracentesis.  Randall [name] and daughter report Randall [name] with dysphagia x 7-10 days with no improvement even after paracentesis.  Chemo tx completed approx one month ago per Randall [name].  BSE ordered.    Assessment / Plan / Recommendation Clinical Impression  Randall [name] presents without focal CN deficits impacting swallow musculature (slight palatal deviation to right upon phonation).   Randall [name] observed swallowing cranberry juice with timely swallow and clear voice - no indication of airway compromise.  Randall [name] did report sensation of slow clearance pointing to proximal esophagus- following distally.  Dry swallows and/or waiting for clearance reported to be only strategy helpful.  Stasis sensation has been occuring for 7-10 days without improvement per Randall [name].  Solid food dysphagia reportedly worse than liquids.  Randall [name] is essentially consuming very small amounts of po throughout the day.  He and daughter are concerned re: this issue and SLP asked if improvement was noted after paracentesis - to which both denied- even temporarily.  Randall [name]'s symptoms appear consistent with esophageal deficits - ? motility issues and/or extrensic compression on esophagus from mass/ascities.  Educated Randall [name] and daughter to  findings and compensation strategy recommendation.  SLP spoke to MD re: findings.  Will sign off at this time.  Thanks for this referral.      Aspiration Risk  None (re: oropharyngeal swallow ability)    Diet Recommendation Thin liquid   Liquid Administration via: Cup;Straw Medication Administration:  (as tolerated) Supervision: Patient able to self feed Compensations: Slow rate;Small sips/bites;Multiple dry swallows after each bite/sip;Follow solids with liquid (consume small amounts throughout the day) Postural Changes and/or Swallow Maneuvers: Seated upright 90 degrees;Upright 30-60 min after meal    Other  Recommendations Recommended Consults: Consider esophageal assessment;Consider GI evaluation Oral Care Recommendations: Oral care BID   Follow Up Recommendations  None    Frequency and Duration        Pertinent Vitals/Pain Afebrile, decreased     Swallow Study Prior Functional Status   see [place] Date of Onset: 05/11/13 HPI: Randall [name] adm to [facility] with intractable N/V and abdomen pain.  Randall [name] has pancreatic cancer and ascities s/p paracentesis.  Randall [name] and daughter report Randall [name] with dysphagia x 7-10 days with no improvement even after paracentesis.  Chemo tx completed approx one month ago per Randall [name].  BSE ordered.  Type of Study: Bedside swallow evaluation Diet Prior to this Study: Thin liquids (advanced to full liquids) Temperature Spikes Noted: No Respiratory Status: Room air History of Recent Intubation: No Behavior/Cognition: Alert;Cooperative;Pleasant mood Oral Cavity - Dentition: Adequate natural dentition Self-Feeding Abilities: Able to feed self Patient Positioning: Upright in chair Baseline Vocal Quality:  (slighty hoarse which Randall [name] states is new x 7-10  days) Volitional Cough: Other (Comment) (DNT but likely adequate) Volitional Swallow: Able to elicit    Oral/Motor/Sensory Function Overall Oral Motor/Sensory Function: Appears within functional limits for tasks  assessed Labial ROM: Within Functional Limits Labial Symmetry: Within Functional Limits Labial Strength: Within Functional Limits Labial Sensation: Within Functional Limits Lingual ROM: Within Functional Limits Lingual Symmetry: Within Functional Limits Lingual Strength: Within Functional Limits Lingual Sensation: Within Functional Limits Facial ROM: Within Functional Limits Facial Symmetry: Within Functional Limits Facial Strength: Within Functional Limits Facial Sensation: Within Functional Limits Velum:  (pulls slightly to the right) Mandible: Within Functional Limits   Ice Chips Ice chips: Not tested   Thin Liquid Thin Liquid: Within functional limits Presentation: Straw    Nectar Thick Nectar Thick Liquid: Not tested   Honey Thick Honey Thick Liquid: Not tested   Puree Puree: Not tested   Solid   GO    Solid: Not tested       Claudie Fisherman, [place] [facility] SLP [phone]

## 2013-05-05 NOTE — Progress Notes (Signed)
IP PROGRESS NOTE  Subjective:   The pain is better with the IV Dilaudid. He is up in a chair eating breakfast this morning. No nausea. He reports early satiety.  Objective: Vital signs in last 24 hours: Blood pressure 112/61, pulse 63, temperature 98.5 F (36.9 C), temperature source Oral, resp. rate 18, height 5\' 8"  (1.727 m), weight 146 lb 11.2 oz (66.543 kg), SpO2 96.00%.  Intake/Output from previous day: 03/04 0701 - 03/05 0700 In: 2872.5 [P.O.:240; I.V.:2632.5] Out: 3 [Urine:3]  Physical Exam:  Lungs: Lungs clear bilaterally Cardiac: Regular rate and rhythm Abdomen: Distended Extremities: No leg edema   Portacath/PICC-without erythema  Lab Results:  Recent Labs  05/04/13 0605 05/04/13 1120  WBC 6.6 8.9  HGB 7.6* 8.2*  HCT 23.9* 25.7*  PLT 137* 176    BMET  Recent Labs  05/03/13 1255 05/04/13 0605  NA 132* 134*  K 4.7 4.5  CL 92* 99  CO2 24 21  GLUCOSE 125* 82  BUN 31* 28*  CREATININE 1.04 0.94  CALCIUM 9.0 8.4    Studies/Results: Ct Abdomen Pelvis W Contrast  05/03/2013   CLINICAL DATA:  Abdominal pain and distention. Pancreatic cancer. Chemotherapy completed 04/04/2013.  EXAM: CT ABDOMEN AND PELVIS WITH CONTRAST  TECHNIQUE: Multidetector CT imaging of the abdomen and pelvis was performed using the standard protocol following bolus administration of intravenous contrast.  CONTRAST:  167mL OMNIPAQUE IOHEXOL 300 MG/ML  SOLN  COMPARISON:  03/28/2013 CT.  FINDINGS: Interval progression of tumor. Primary pancreatic mass and surrounding adenopathy have progressed since the prior examination as has the size of multiple hepatic metastatic lesions. As an index hepatic metastatic lesion, right lobe 3.9 x 3.5 cm lesion previously measured 2.4 x 2.8 cm.  Progressive thrombosis of the splenic vein, portal veins and superior mesenteric vein.  Progressive ascites.  No free intraperitoneal air or definitive findings to suggest pneumatosis. Scattered diverticula. Limited  for evaluating bowel inflammatory process given the ascites.  Gallbladder sludge//stones. Limited for detecting cholecystitis given the ascites.  Prominent atherosclerotic type changes abdominal aorta with irregular aneurysmal dilation similar to prior exam. Atherosclerotic type changes with ectasia narrowing involving common iliac arteries.  Splenomegaly spanning over 13.1 cm.  No hydronephrosis or worrisome focal renal or adrenal lesion.  L5 pars defects with grade 1 anterior slippage of L5 with spinal stenosis and foraminal narrowing.  Lung base nodules more notable on the right without significant change.  IMPRESSION: Interval progression of tumor. Primary pancreatic mass and surrounding adenopathy have progressed since the prior examination as has the size of multiple hepatic metastatic lesions. As an index hepatic metastatic lesion, right lobe 3.9 x 3.5 cm lesion previously measured 2.4 x 2.8 cm.  Progressive thrombosis of the splenic vein, portal veins and superior mesenteric vein.  Progressive ascites.  Gallbladder sludge//stones.  Prominent atherosclerotic type changes abdominal aorta with irregular aneurysmal dilation similar to prior exam.  Splenomegaly spanning over 13.1 cm.  Lung base nodules more notable on the right without significant change.  Please see above.  These results will be called to the ordering clinician or representative by the Radiologist Assistant, and communication documented in the PACS Dashboard. .   Electronically Signed   By: Chauncey Cruel M.D.   On: 05/03/2013 18:53   Dg Abd Acute W/chest  05/03/2013   CLINICAL DATA:  Abdominal pain.  History of pancreatic cancer.  EXAM: ACUTE ABDOMEN SERIES (ABDOMEN 2 VIEW & CHEST 1 VIEW)  COMPARISON:  Chest 09/28/2012  FINDINGS: Port-A-Cath tip in the  SVC unchanged. Lungs remain clear without infiltrate mass or edema. No effusion.  Normal bowel gas pattern without obstruction or free air. Lumbar degenerative changes. No acute bony abnormality.   IMPRESSION: Negative abdominal radiographs.  No acute cardiopulmonary disease.   Electronically Signed   By: Franchot Gallo M.D.   On: 05/03/2013 14:00    Medications: I have reviewed the patient's current medications.  Assessment/Plan: 1. Metastatic pancreas cancer. The clinical presentation, elevated CA 19-9 and abdominal CT consistent with a diagnosis of metastatic pancreas cancer. 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 Initiation of gemcitabine/Abraxane on 07/21/2012. Last treated on 08/04/2012.  The CA 19-9 was improved on 09/01/2012 following 3 treatments with gemcitabine/Abraxane.  Restaging CT evaluation 09/29/2012 showed interval decrease in the left lower lobe pulmonary nodule; interval decrease in the size of the pancreatic head mass with decreasing regional lymphadenopathy and decreasing metastatic disease within the liver. No new or progressive disease noted.  CA 19-9 40775 on 07/07/2012, 1631 on 09/01/2012 and 2105 on 09/29/2012, ~2292 on 10/26/2012, ~ 5765 on 11/23/2012, ~5720 on 12/07/2012, 8383 on 01/04/2013.  Cycle 1 FOLFOX 10/12/2012, cycle 2 10/26/2012, cycle 3 11/23/2012, cycle 4 12/07/2012, cycle 5 12/21/2012, cycle 6 01/04/2013.  Restaging CT evaluation 01/17/2013 showed multiple new parenchymal and subpleural nodules, nonspecific; increased pancreatic head mass/regional adenopathy; mixed response in the liver.  Initiation of Abraxane 01/18/2013.  03/07/2013 CA 19-9 mildly increased from previous value; further increased on 03/14/2013.  CA 19-9 improved on 03/21/2013.  Restaging CT 03/31/2013 with a mixed response in the liver, worsening of portal vein thrombosis.  CA 19-9 increased 04/20/2013.  Malignant ascites status post paracentesis 04/20/2013 with cytology showing malignant cells consistent with metastatic adenocarcinoma. 2. Pain secondary to #1.  3. Acute myocardial  infarction 06/16/2012 status post PTCA/drug-eluting stent 4. Status post Port-A-Cath placement 07/16/2012. 5. Admission with high fever and chills on 08/11/2012. CT of the chest revealed multilobar infiltrates. He was diagnosed with chemotherapy induced pneumonitis. He improved with steroids. Chest x-ray 09/15/2012 showed continued improvement. 6. Mucositis following cycle 4 FOLFOX. The 5-FU was dose reduced beginning with cycle 5. 7. Thrombocytopenia secondary to chemotherapy. The oxaliplatin was dose reduced beginning with cycle 5 FOLFOX. 8. History of neutropenia secondary to chemotherapy 9. Intermittent nose bleeding-likely related to mucositis from chemotherapy and aspirin/brilinta 10. 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 11. 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 Progression noted on the CT 05/03/2013 12. Distended abdomen with increased pain-? Secondary to progression of the pancreas cancer with carcinomatosis versus portal hypertension. Paracentesis 04/20/2013 with cytology confirming malignant cells consistent with metastatic adenocarcinoma.  The pain appears to be under better control at present. We will switch to oral narcotic regimen. I discussed treatment options with Mr. Griswold and his daughter. I recommend hospice care and he agrees. We reviewed CPR and ACLS issues. He will be placed on a no CODE BLUE status.  I reviewed the 05/03/2013 CT. The portal /superior mesenteric vein thromboses have progressed. There is collateral circulation. The thrombotic disease could be in part responsible for his pain.   recommendations:  1. Lovenox anticoagulation 2. Narcotic analgesics-switch to an oral regimen today 3. [facility] hospice referral for home care 4. Decrease IV fluids   I appreciate the care from the hospitalist  team.     LOS: 2 days  Dawt Reeb  05/05/2013, 8:53 AM

## 2013-05-05 NOTE — Progress Notes (Signed)
[place] Work  [place] Work made aware that daughter was requesting SNF list and phoned her to follow up from previous [place]. Daughter, Ander Purpura reports Pt now admitted and she feels he may need a better plan than home after discharge. CSW to send via email SNF list to daughter. CSW also phoned unit CSW, Caren Hazy to make referral for inpt assistance as well. Daughter denies other concerns currently and CSW stressed for her to reach out as needed.   Clinical Social Work interventions: SNF list and referral for CSW inpt follow up.   Loren Racer, LCSW Clinical Social Worker [facility] for [facility] Wednesday, Thursday and Friday Phone: [phone] Fax: [phone]

## 2013-05-05 NOTE — Progress Notes (Signed)
[facility] PROGRESS NOTE  OTHAR NEVILLS XLK:[id number] DOB: 11/04/1940 DOA: 05/03/2013 PCP: Carrie Mew, MD  Assessment/Plan: Abdominal pain  - likely due to progression of his cancer vs progression of his thrombosis vs less likely infection post paracentesis. Dr. Truett Perna to see. Therapeutic Lovenox ordered for SVC thrombosis. Pt reportedly was unable to afford med as outpatient. Will defer anticoagulant choice to Dr. Truett Perna. Case mgt consulted for med assistance - NPO for now, IVF, pain control with IV medications.  - appears a bit dehydrated with high BUN/Cr ratio.  - Suspected esophageal dysfunction - esophogram ordered per SLP recs Metastatic pancreatic cancer - Metastatic lesion increased in size nearly 1cm in diameter since previous CT in 2/15. Dr. Truett Perna to see.Marland Kitchen  CAD s/p stent 12 months ago - stable, no chest pain, just finished his Brilinta. No chest pain currently. Obtain an admission EKG as it hasn't been done in the ED. Consider resuming when resuming PO intake Protein calorie malnutrition, severe - NPO for now, IVF.  Anemia - in the setting of #2, at baseline  Abdominal distention - improved today, s/p recent paracentesis  Code Status: DNR Family Communication: Pt in room (indicate person spoken with, relationship, and if by phone, the number) Disposition Plan: Pending  Consultants:  Dr. Truett Perna  Procedures:    Antibiotics:    HPI/Subjective: No acute events noted overnight. Pt tolerated clear liquid diet  Objective: Filed Vitals:   05/04/13 0615 05/04/13 1500 05/04/13 2120 05/05/13 0638  BP: 108/60 112/54 119/68 112/61  Pulse: 58 62 64 63  Temp: 98.3 F (36.8 C) 97.5 F (36.4 C) 98.5 F (36.9 C) 98.5 F (36.9 C)  TempSrc: Oral Oral Oral Oral  Resp: 18 16 18 18   Height:      Weight:      SpO2: 95% 98% 96% 96%    Intake/Output Summary (Last 24 hours) at 05/05/13 1320 Last data filed at 05/05/13 0400  Gross per 24 hour  Intake  2872.5 ml  Output      2 ml  Net 2870.5 ml   Filed Weights   05/03/13 1608  Weight: 66.543 kg (146 lb 11.2 oz)    Exam:   General:  Awake, in nad  Cardiovascular: regular, s1, s2  Respiratory: normal resp effort, no wheezing  Abdomen: soft, nondistended  Musculoskeletal: perfused, no clubbing   Data Reviewed: Basic Metabolic Panel:  Recent Labs Lab 05/03/13 1255 05/04/13 0605  NA 132* 134*  K 4.7 4.5  CL 92* 99  CO2 24 21  GLUCOSE 125* 82  BUN 31* 28*  CREATININE 1.04 0.94  CALCIUM 9.0 8.4  MG  --  2.3  PHOS  --  3.1   Liver Function Tests:  Recent Labs Lab 05/03/13 1255 05/04/13 0605  AST 45* 38*  ALT 33 25  ALKPHOS 228* 199*  BILITOT 1.7* 1.0  PROT 6.7 6.1  ALBUMIN 3.2* 2.8*    Recent Labs Lab 05/03/13 1255  LIPASE 22   No results found for this basename: AMMONIA,  in the last 168 hours CBC:  Recent Labs Lab 05/03/13 1255 05/04/13 0605 05/04/13 1120  WBC 11.3* 6.6 8.9  NEUTROABS 9.3*  --   --   HGB 9.0* 7.6* 8.2*  HCT 26.4* 23.9* 25.7*  MCV 86.3 88.5 88.9  PLT 232 137* 176   Cardiac Enzymes: No results found for this basename: CKTOTAL, CKMB, CKMBINDEX, TROPONINI,  in the last 168 hours BNP (last 3 results)  Recent Labs  08/16/12 0500 08/17/12  1525 08/18/12 0457  PROBNP 2332.0* 4896.0* 4718.0*   CBG: No results found for this basename: GLUCAP,  in the last 168 hours  No results found for this or any previous visit (from the past 240 hour(s)).   Studies: Ct Abdomen Pelvis W Contrast  05/03/2013   CLINICAL DATA:  Abdominal pain and distention. Pancreatic cancer. Chemotherapy completed 04/04/2013.  EXAM: CT ABDOMEN AND PELVIS WITH CONTRAST  TECHNIQUE: Multidetector CT imaging of the abdomen and pelvis was performed using the standard protocol following bolus administration of intravenous contrast.  CONTRAST:  OMNIPAQUE IOHEXOL 300 MG/ML  SOLN  COMPARISON:  03/28/2013 CT.  FINDINGS: Interval progression of tumor. Primary  pancreatic mass and surrounding adenopathy have progressed since the prior examination as has the size of multiple hepatic metastatic lesions. As an index hepatic metastatic lesion, right lobe 3.9 x 3.5 cm lesion previously measured 2.4 x 2.8 cm.  Progressive thrombosis of the splenic vein, portal veins and superior mesenteric vein.  Progressive ascites.  No free intraperitoneal air or definitive findings to suggest pneumatosis. Scattered diverticula. Limited for evaluating bowel inflammatory process given the ascites.  Gallbladder sludge//stones. Limited for detecting cholecystitis given the ascites.  Prominent atherosclerotic type changes abdominal aorta with irregular aneurysmal dilation similar to prior exam. Atherosclerotic type changes with ectasia narrowing involving common iliac arteries.  Splenomegaly spanning over 13.1 cm.  No hydronephrosis or worrisome focal renal or adrenal lesion.  L5 pars defects with grade 1 anterior slippage of L5 with spinal stenosis and foraminal narrowing.  Lung base nodules more notable on the right without significant change.  IMPRESSION: Interval progression of tumor. Primary pancreatic mass and surrounding adenopathy have progressed since the prior examination as has the size of multiple hepatic metastatic lesions. As an index hepatic metastatic lesion, right lobe 3.9 x 3.5 cm lesion previously measured 2.4 x 2.8 cm.  Progressive thrombosis of the splenic vein, portal veins and superior mesenteric vein.  Progressive ascites.  Gallbladder sludge//stones.  Prominent atherosclerotic type changes abdominal aorta with irregular aneurysmal dilation similar to prior exam.  Splenomegaly spanning over 13.1 cm.  Lung base nodules more notable on the right without significant change.  Please see above.  These results will be called to the ordering clinician or representative by the Radiologist Assistant, and communication documented in the PACS Dashboard. .   Electronically Signed   By:  Bridgett Larsson M.D.   On: 05/03/2013 18:53   Dg Abd Acute W/chest  05/03/2013   CLINICAL DATA:  Abdominal pain.  History of pancreatic cancer.  EXAM: ACUTE ABDOMEN SERIES (ABDOMEN 2 VIEW & CHEST 1 VIEW)  COMPARISON:  Chest 09/28/2012  FINDINGS: Port-A-Cath tip in the SVC unchanged. Lungs remain clear without infiltrate mass or edema. No effusion.  Normal bowel gas pattern without obstruction or free air. Lumbar degenerative changes. No acute bony abnormality.  IMPRESSION: Negative abdominal radiographs.  No acute cardiopulmonary disease.   Electronically Signed   By: Marlan Palau M.D.   On: 05/03/2013 14:00    Scheduled Meds: . enoxaparin (LOVENOX) injection  100 mg Subcutaneous Q24H  . morphine  30 mg Oral Q12H   Continuous Infusions: . sodium chloride 20 mL/hr at 05/05/13 9528    Principal Problem:   Intractable abdominal pain Active Problems:   Pancreatic mass   Malignant neoplasm of pancreas, part unspecified   Protein-calorie malnutrition, severe   Abdominal pain   CAD (coronary artery disease)   Anemia   Ascites  Time spent:  Randall [name]  K  [facility] Pager [phone]. If 7PM-7AM, please contact night-coverage at [email or web address], password [email or web address] 05/05/2013, 1:20 PM  LOS: 2 days

## 2013-05-06 DIAGNOSIS — R1310 Dysphagia, unspecified: Secondary | ICD-10-CM

## 2013-05-06 DIAGNOSIS — I2510 Atherosclerotic heart disease of native coronary artery without angina pectoris: Secondary | ICD-10-CM

## 2013-05-06 DIAGNOSIS — R180 Malignant ascites: Secondary | ICD-10-CM

## 2013-05-06 DIAGNOSIS — R030 Elevated blood-pressure reading, without diagnosis of hypertension: Secondary | ICD-10-CM

## 2013-05-06 DIAGNOSIS — R6881 Early satiety: Secondary | ICD-10-CM

## 2013-05-06 DIAGNOSIS — Z7901 Long term (current) use of anticoagulants: Secondary | ICD-10-CM

## 2013-05-06 MED ORDER — ENOXAPARIN SODIUM 100 MG/ML ~~LOC~~ SOLN: 100.0000 mg | SUBCUTANEOUS | Status: AC

## 2013-05-06 MED ORDER — HEPARIN SOD (PORK) LOCK FLUSH 100 UNIT/ML IV SOLN
500.0000 [IU] | INTRAVENOUS | Status: AC | PRN
  Administered 2013-05-06: 500 [IU]

## 2013-05-06 MED ORDER — MORPHINE SULFATE ER 30 MG PO TBCR
30.0000 mg | EXTENDED_RELEASE_TABLET | Freq: Two times a day (BID) | ORAL | Status: DC
Start: 2013-05-06 — End: 2013-05-11

## 2013-05-06 MED ORDER — HEPARIN SOD (PORK) LOCK FLUSH 100 UNIT/ML IV SOLN: 500.0000 [IU] | INTRAVENOUS | Status: DC | PRN

## 2013-05-06 NOTE — Discharge Summary (Addendum)
Physician Discharge Summary  Randall [name] IRS:[id number] DOB: Oct 17, 1940 DOA: 05/03/2013  PCP: Georgetta Haber, MD  Admit date: 05/03/2013 Discharge date: 05/06/2013  Time spent: 40 minutes  Recommendations for Outpatient Follow-up:  1. Follow up with Dr. Benay Spice as scheduled  2. May consider outpaient GI referral if dysphagia persists 3. Previous lovenox dose discontinued with a new dose of lovenox prescribed  Discharge Diagnoses:  Principal Problem:   Intractable abdominal pain Active Problems:   Pancreatic mass   Malignant neoplasm of pancreas, part unspecified   Protein-calorie malnutrition, severe   Abdominal pain   CAD (coronary artery disease)   Anemia   Ascites   Discharge Condition: Stable  Diet recommendation: mechanical soft diet  Filed Weights   05/03/13 1608  Weight: 66.543 kg (146 lb 11.2 oz)    History of present illness:  Randall [name] is a 73 y.o. male has a past medical history significant for pancreatic cancer, seen by Dr. Learta Codding, presents to the emergency room with a chief complaint of abdominal pain, progressively worse in the last 2 days, exacerbated by food. He states that every time he tries to eat his abdominal pain gets worse, and doesn't go away with his oral home medications. He endorses nausea with eating, denies any vomiting. He denies any chest pain. Has no shortness of breath. Has no lightheadedness or dizziness. His cancer seems to be progressing despite chemotherapy, and he last saw Dr. Learta Codding yesterday. He also has had progressive abdominal distention secondary to ascites, and required so far about 4 paracentesis, most recent one done yesterday. He has no fever or chills.  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 Learta Codding that was discontinued.   Hospital Course:  Abdominal pain  - likely due to progression of his cancer vs progression of  his thrombosis vs less likely infection post paracentesis. Dr. Benay Spice following. Therapeutic Lovenox ordered for SVC thrombosis with recs to continue as an outpatient. Pt reportedly was unable to afford med as outpatient. Case mgt consulted for med assistance  - Recs to continue with liquid/mechanical soft diet as tolerated (see below) Metastatic pancreatic cancer - Metastatic lesion increased in size nearly 1cm in diameter since previous CT in 2/15. Dr. Benay Spice to see.Marland Kitchen  CAD s/p stent 12 months ago - stable, no chest pain, just finished his Brilinta. No chest pain currently. Obtain an admission EKG as it hasn't been done in the ED. Consider resuming when resuming PO intake  Protein calorie malnutrition, severe - NPO for now, IVF.  Anemia - in the setting of #2, at baseline  Abdominal distention - improved s/p recent paracentesis Dysphagia - Esophogram demonstrated mild narrowing of distal esophagus with tiny Zenker's diverticulum  Procedures:  Esophogram 07/05/13  Consultations:  Oncology  Discharge Exam: Filed Vitals:   05/05/13 0093 05/05/13 1441 05/05/13 2124 05/06/13 0556  BP: 112/61 117/60 108/67 108/65  Pulse: 63 63 87 76  Temp: 98.5 F (36.9 C) 97.5 F (36.4 C) 97.9 F (36.6 C) 98 F (36.7 C)  TempSrc: Oral Oral Oral Oral  Resp: 18 20 20 16   Height:      Weight:      SpO2: 96%  97% 97%    General: awake, ambulatory, in nad Cardiovascular: regular, s1, s2 Respiratory: normal resp effort, no wheezing  Discharge Instructions       Future Appointments Provider Department Dept Phone   05/09/2013 11:00  AM [facility] [phone]   05/16/2013 2:00 PM [facility] [id number]   05/17/2013 10:30 AM Ladell Pier, MD [facility] [phone]   06/20/2013 11:15 AM Ricard Dillon, MD [facility]       Medication List          ALPRAZolam 0.5 MG tablet  Commonly known as:  XANAX  Take 1 tablet (0.5 mg total) by mouth 3 (three) times daily as needed for anxiety.     aspirin EC 81 MG tablet  Take 81 mg by mouth daily.     atorvastatin 80 MG tablet  Commonly known as:  LIPITOR  Take 1 tablet (80 mg total) by mouth daily at 6 PM.     carvedilol 6.25 MG tablet  Commonly known as:  COREG  Take 6.25 mg by mouth 2 (two) times daily with a meal.     citalopram 20 MG tablet  Commonly known as:  CELEXA  Take 1 tablet (20 mg total) by mouth daily.     docusate sodium 100 MG capsule  Commonly known as:  COLACE  Take 100 mg by mouth 2 (two) times daily.     enoxaparin 100 MG/ML injection  Commonly known as:  LOVENOX  Inject 1 mL (100 mg total) into the skin daily.     furosemide 20 MG tablet  Commonly known as:  LASIX  Take 0.5 tablets (10 mg total) by mouth 2 (two) times daily.     lidocaine-prilocaine cream  Commonly known as:  EMLA  Apply topically as needed. To PAC prn     magnesium citrate Soln  Take 296 mLs by mouth once.     morphine 15 MG tablet  Commonly known as:  MSIR  Take 1-2 tablets (15-30 mg total) by mouth every 4 (four) hours as needed for severe pain.     morphine 30 MG 12 hr tablet  Commonly known as:  MS CONTIN  Take 1 tablet (30 mg total) by mouth every 12 (twelve) hours.     nitroGLYCERIN 0.4 MG SL tablet  Commonly known as:  NITROSTAT  Place 1 tablet (0.4 mg total) under the tongue every 5 (five) minutes x 3 doses as needed for chest pain.     oxyCODONE 5 MG immediate release tablet  Commonly known as:  Oxy IR/ROXICODONE  Take 1-2 tablets (5-10 mg total) by mouth every 6 (six) hours as needed for severe pain.     polyethylene glycol packet  Commonly known as:  MIRALAX / GLYCOLAX  Take 17 g by mouth daily as needed.     PRESCRIPTION MEDICATION  - PACLitaxel-protein bound (ABRAXANE) chemo infusion 175 mg 100 mg/m2  1.85 m2 (Treatment Plan Actual)  Once 04/04/2013   - [facility]      prochlorperazine 10 MG tablet  Commonly known as:  COMPAZINE  Take 1 tablet (10 mg total) by mouth every 6 (six) hours as needed (nausea).     ramipril 1.25 MG capsule  Commonly known as:  ALTACE  Take 1 capsule (1.25 mg total) by mouth daily.     spironolactone 25 MG tablet  Commonly known as:  ALDACTONE  Take 1 tablet (25 mg total) by mouth daily.       Allergies  Allergen Reactions  . Gemzar [Gemcitabine] Shortness Of Breath    pneumonitis   Follow-up Information   [facility] ([facility] to follow aftr d/c Pls notify once pt ready to leave unit -call [phone] or after 5pm [phone])    Contact information:   [place], [place]      Follow up with Betsy Coder, MD. (as scheduled)    Specialty:  Oncology   Contact information:   [place] [address] [phone]        The results of significant diagnostics from this hospitalization (including imaging, microbiology, ancillary and laboratory) are listed below for reference.    Significant Diagnostic Studies: Ct Abdomen Pelvis W Contrast  05/03/2013   CLINICAL DATA:  Abdominal pain and distention. Pancreatic cancer. Chemotherapy completed 04/04/2013.  EXAM: CT ABDOMEN AND PELVIS WITH CONTRAST  TECHNIQUE: Multidetector CT imaging of the abdomen and pelvis was performed using the standard protocol following bolus administration of intravenous contrast.  CONTRAST:  123mL OMNIPAQUE IOHEXOL 300 MG/ML  SOLN  COMPARISON:  03/28/2013 CT.  FINDINGS: Interval progression of tumor. Primary pancreatic mass and surrounding adenopathy have progressed since the prior examination as has the size of multiple hepatic metastatic lesions. As an index hepatic metastatic lesion, right lobe 3.9 x 3.5 cm lesion previously measured 2.4 x 2.8 cm.  Progressive thrombosis of the splenic vein, portal veins and superior mesenteric vein.   Progressive ascites.  No free intraperitoneal air or definitive findings to suggest pneumatosis. Scattered diverticula. Limited for evaluating bowel inflammatory process given the ascites.  Gallbladder sludge//stones. Limited for detecting cholecystitis given the ascites.  Prominent atherosclerotic type changes abdominal aorta with irregular aneurysmal dilation similar to prior exam. Atherosclerotic type changes with ectasia narrowing involving common iliac arteries.  Splenomegaly spanning over 13.1 cm.  No hydronephrosis or worrisome focal renal or adrenal lesion.  L5 pars defects with grade 1 anterior slippage of L5 with spinal stenosis and foraminal narrowing.  Lung base nodules more notable on the right without significant change.  IMPRESSION: Interval progression of tumor. Primary pancreatic mass and surrounding adenopathy have progressed since the prior examination as has the size of multiple hepatic metastatic lesions. As an index hepatic metastatic lesion, right lobe 3.9 x 3.5 cm lesion previously measured 2.4 x 2.8 cm.  Progressive thrombosis of the splenic vein, portal veins and superior mesenteric vein.  Progressive ascites.  Gallbladder sludge//stones.  Prominent atherosclerotic type changes abdominal aorta with irregular aneurysmal dilation similar to prior exam.  Splenomegaly spanning over 13.1 cm.  Lung base nodules more notable on the right without significant change.  Please see above.  These results will be called to the ordering clinician or representative by the Radiologist Assistant, and communication documented in the PACS Dashboard. .   Electronically Signed   By: Chauncey Cruel M.D.   On: 05/03/2013 18:53   Dg Esophagus  05/05/2013   CLINICAL DATA:  Progressive dysphagia for 1 week. History of metastatic pancreatic cancer.  EXAM: ESOPHOGRAM / BARIUM SWALLOW / BARIUM TABLET STUDY  TECHNIQUE: Combined double contrast and single contrast examination performed using effervescent crystals, thick  barium liquid, and thin barium liquid. The patient was observed with fluoroscopy swallowing a 48mm barium sulphate tablet.  COMPARISON:  CT ABD/PELVIS W CM dated 05/03/2013; CT CHEST W/CM dated 03/28/2013  FLUOROSCOPY TIME:  1 min 42 seconds  FINDINGS: In the initial erect position, the patient swallowed the barium without difficulty. Rapid sequence imaging of the pharynx in the latter projection demonstrated silent aspiration into the trachea. Patient was able to partially clear this with throat clearing.  No additional aspiration was observed. There is a tiny Zenker's diverticulum. No laryngeal mucosal lesions are identified.  There is mild presbyesophagus with in decreased primary stripping wave. There is mild smooth narrowing of the distal esophagus. No focal mucosal ulceration is identified. Mild reflux was elicited with the water siphon test.  A 13 mm barium tablet was administered and was delayed at the gastroesophageal junction, not passing during 3 min of intermittent fluoroscopic observation.  IMPRESSION: 1. Single episode of silent aspiration into the trachea. 2. No focal mucosal lesions are identified. However, there is mild narrowing of the distal esophagus preventing the passage of a 13 mm barium tablet over 3 min of intermittent fluoroscopic observation. 3. Mild presbyesophagus and reflux. 4. Tiny Zenker's diverticulum.   Electronically Signed   By: Camie Patience M.D.   On: 05/05/2013 13:25   US Paracentesis  05/02/2013   CLINICAL DATA:  Metastatic pancreatic carcinoma, recurrent ascites, abdominal distention. Request is made for therapeutic paracentesis.  EXAM: ULTRASOUND GUIDED THERAPEUTIC PARACENTESIS  COMPARISON:  PREVIOUS PARACENTESIS ON 04/27/2013.  PROCEDURE: An ultrasound guided paracentesis was thoroughly discussed with the patient and questions answered. The benefits, risks, alternatives and complications were also discussed. The patient understands and wishes to proceed with the procedure.  Written consent was obtained.  Ultrasound was performed to localize and mark an adequate pocket of fluid in the left lower quadrant of the abdomen. The area was then prepped and draped in the normal sterile fashion. 1% Lidocaine was used for local anesthesia. Under ultrasound guidance a 19 gauge Yueh catheter was introduced. Paracentesis was performed. The catheter was removed and a dressing applied.  Complications: None.  FINDINGS: A total of approximately 2.4 liters of amber fluid was removed.  IMPRESSION: Successful ultrasound guided therapeutic paracentesis yielding 2.4 liters of ascites.  Read by: Rowe Robert ,P.A.-C.   Electronically Signed   By: Aletta Edouard M.D.   On: 05/02/2013 14:31   US Paracentesis  04/28/2013   CLINICAL DATA:  Pancreas cancer, ascites, request for therapeutic paracentesis.  EXAM: ULTRASOUND GUIDED therapeutic PARACENTESIS  COMPARISON:  None.  PROCEDURE: An ultrasound guided paracentesis was thoroughly discussed with the patient and questions answered. The benefits, risks, alternatives and complications were also discussed. The patient understands and wishes to proceed with the procedure. Written consent was obtained.  Ultrasound was performed to localize and mark an adequate pocket of fluid in the left lower quadrant of the abdomen. The area was then prepped and draped in the normal sterile fashion. 1% Lidocaine was used for local anesthesia. Under ultrasound guidance a 19 gauge Yueh catheter was introduced. Paracentesis was performed. The catheter was removed and a dressing applied.  Complications: None.  FINDINGS: A total of approximately 2 liters of bloody fluid was removed. A fluid sample was not sent for laboratory analysis.  IMPRESSION: Successful ultrasound guided paracentesis yielding 2 liters of ascites.  Read By:  Tsosie Billing PA-C   Electronically Signed   By: Sandi Mariscal M.D.   On: 04/28/2013 11:56   US Paracentesis  04/22/2013   CLINICAL DATA:  Pancreatic cancer  with malignant ascites. Request therapeutic paracentesis.  EXAM: ULTRASOUND GUIDED PARACENTESIS  COMPARISON:  Previous paracentesis  PROCEDURE: An ultrasound guided paracentesis was thoroughly discussed with the patient and questions answered. The benefits, risks, alternatives and complications were also discussed. The patient understands and wishes to proceed with the procedure. Written consent was obtained.  Ultrasound was performed to localize and mark an adequate pocket of fluid in the  left lower quadrant quadrant of the abdomen. The area was then prepped and draped in the normal sterile fashion. 1% Lidocaine was used for local anesthesia. Under ultrasound guidance a 19 gauge Yueh catheter was introduced. Paracentesis was performed. The catheter was removed and a dressing applied.  COMPLICATIONS: None immediate  FINDINGS: A total of approximately 2.6 L of bloody ascitic fluid was removed. A fluid sample was not sent for laboratory analysis.  IMPRESSION: Successful ultrasound guided paracentesis yielding 2.6 L of ascites.  Read by: Ascencion Dike PA-C   Electronically Signed   By: Arne Cleveland M.D.   On: 04/22/2013 11:57   US Paracentesis  04/20/2013   CLINICAL DATA:  History pancreatic cancer, abdominal distention, ascites. Request diagnostic and therapeutic paracentesis.  EXAM: ULTRASOUND GUIDED PARACENTESIS  COMPARISON:  None.  PROCEDURE: An ultrasound guided paracentesis was thoroughly discussed with the patient and questions answered. The benefits, risks, alternatives and complications were also discussed. The patient understands and wishes to proceed with the procedure. Written consent was obtained.  Ultrasound was performed to localize and mark an adequate pocket of fluid in the right lower quadrant of the abdomen. The area was then prepped and draped in the normal sterile fashion. 1% Lidocaine was used for local anesthesia. Under ultrasound guidance a 19 gauge Yueh catheter was introduced.  Paracentesis was performed. The catheter was removed and a dressing applied.  COMPLICATIONS: None immediate  FINDINGS: A total of approximately 1 L of cloudy yellow fluid was removed. A fluid sample was sent for laboratory analysis.  IMPRESSION: Successful ultrasound guided paracentesis yielding 1 L of ascites.  Read by: Ascencion Dike PA-C   Electronically Signed   By: Jacqulynn Cadet M.D.   On: 04/20/2013 11:04   Dg Abd Acute W/chest  05/03/2013   CLINICAL DATA:  Abdominal pain.  History of pancreatic cancer.  EXAM: ACUTE ABDOMEN SERIES (ABDOMEN 2 VIEW & CHEST 1 VIEW)  COMPARISON:  Chest 09/28/2012  FINDINGS: Port-A-Cath tip in the SVC unchanged. Lungs remain clear without infiltrate mass or edema. No effusion.  Normal bowel gas pattern without obstruction or free air. Lumbar degenerative changes. No acute bony abnormality.  IMPRESSION: Negative abdominal radiographs.  No acute cardiopulmonary disease.   Electronically Signed   By: Franchot Gallo M.D.   On: 05/03/2013 14:00    Microbiology: No results found for this or any previous visit (from the past 240 hour(s)).   Labs: Basic Metabolic Panel:  Recent Labs Lab 05/03/13 1255 05/04/13 0605  NA 132* 134*  K 4.7 4.5  CL 92* 99  CO2 24 21  GLUCOSE 125* 82  BUN 31* 28*  CREATININE 1.04 0.94  CALCIUM 9.0 8.4  MG  --  2.3  PHOS  --  3.1   Liver Function Tests:  Recent Labs Lab 05/03/13 1255 05/04/13 0605  AST 45* 38*  ALT 33 25  ALKPHOS 228* 199*  BILITOT 1.7* 1.0  PROT 6.7 6.1  ALBUMIN 3.2* 2.8*    Recent Labs Lab 05/03/13 1255  LIPASE 22   No results found for this basename: AMMONIA,  in the last 168 hours CBC:  Recent Labs Lab 05/03/13 1255 05/04/13 0605 05/04/13 1120  WBC 11.3* 6.6 8.9  NEUTROABS 9.3*  --   --   HGB 9.0* 7.6* 8.2*  HCT 26.4* 23.9* 25.7*  MCV 86.3 88.5 88.9  PLT 232 137* 176   Cardiac Enzymes: No results found for this basename: CKTOTAL, CKMB, CKMBINDEX, TROPONINI,  in the last 168  hours  BNP: BNP (last 3 results)  Recent Labs  08/16/12 0500 08/17/12 1525 08/18/12 0457  PROBNP 2332.0* 4896.0* 4718.0*   CBG: No results found for this basename: GLUCAP,  in the last 168 hours  Signed:  CHIU, STEPHEN K  [facility] 05/06/2013, 11:58 AM

## 2013-05-06 NOTE — Progress Notes (Signed)
Per palliative note, patient will go home upon discharge. Please consult CSW if needed.  Shmiel Morton C. Cockrell Hill MSW, [place]

## 2013-05-06 NOTE — Progress Notes (Signed)
Pt was referred for a spiritual care visit by the chaplain. Chaplain visited pt., pt was being discharged and did not need any services from chaplain.   Charyl Dancer, Chaplain

## 2013-05-06 NOTE — Progress Notes (Signed)
IP PROGRESS NOTE  Subjective:   He had an episode of vomiting after the barium swallow yesterday. No further nausea or vomiting. He is eating breakfast today. Randall [name] and Randall [name] met with the hospice team yesterday. He plans to enroll in home hospice.  Objective: Vital signs in last 24 hours: Blood pressure 108/65, pulse 76, temperature 98 F (36.7 C), temperature source Oral, resp. rate 16, height _0  (1.727 m), weight 146 lb 11.2 oz (66.543 kg), SpO2 97.00%.  Intake/Output from previous day: 03/05 0701 - 03/06 0700 In: 809.1 [I.V.:809.1] Out: -   Physical Exam:  Lungs: Lungs decreased breath sounds with end inspiratory rhonchi at the right base, no respiratory distress Cardiac: Regular rate and rhythm Abdomen: Distended Extremities: No leg edema   Portacath/PICC-without erythema  Lab Results:  Recent Labs  05/04/13 0605 05/04/13 1120  WBC 6.6 8.9  HGB 7.6* 8.2*  HCT 23.9* 25.7*  PLT 137* 176    BMET  Recent Labs  05/03/13 1255 05/04/13 0605  NA 132* 134*  K 4.7 4.5  CL 92* 99  CO2 24 21  GLUCOSE 125* 82  BUN 31* 28*  CREATININE 1.04 0.94  CALCIUM 9.0 8.4    Studies/Results: Dg Esophagus  05/05/2013   CLINICAL DATA:  Progressive dysphagia for 1 week. History of metastatic pancreatic cancer.  EXAM: ESOPHOGRAM / BARIUM SWALLOW / BARIUM TABLET STUDY  TECHNIQUE: Combined double contrast and single contrast examination performed using effervescent crystals, thick barium liquid, and thin barium liquid. The patient was observed with fluoroscopy swallowing a 45m barium sulphate tablet.  COMPARISON:  CT ABD/PELVIS W CM dated 05/03/2013; CT CHEST W/CM dated 03/28/2013  FLUOROSCOPY TIME:  1 min 42 seconds  FINDINGS: In the initial erect position, the patient swallowed the barium without difficulty. Rapid sequence imaging of the pharynx in the latter projection demonstrated silent aspiration into the trachea. Patient was able to partially clear this with throat  clearing. No additional aspiration was observed. There is a tiny Zenker's diverticulum. No laryngeal mucosal lesions are identified.  There is mild presbyesophagus with in decreased primary stripping wave. There is mild smooth narrowing of the distal esophagus. No focal mucosal ulceration is identified. Mild reflux was elicited with the water siphon test.  A 13 mm barium tablet was administered and was delayed at the gastroesophageal junction, not passing during 3 min of intermittent fluoroscopic observation.  IMPRESSION: 1. Single episode of silent aspiration into the trachea. 2. No focal mucosal lesions are identified. However, there is mild narrowing of the distal esophagus preventing the passage of a 13 mm barium tablet over 3 min of intermittent fluoroscopic observation. 3. Mild presbyesophagus and reflux. 4. Tiny Zenker's diverticulum.   Electronically Signed   By: BCamie PatienceM.D.   On: 05/05/2013 13:25    Medications: I have reviewed the patient's current medications.  Assessment/Plan: 1. Metastatic pancreas cancer. The clinical presentation, elevated CA 19-9 and abdominal CT consistent with a diagnosis of metastatic pancreas cancer. 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 Initiation of gemcitabine/Abraxane on 07/21/2012. Last treated on 08/04/2012.  The CA 19-9 was improved on 09/01/2012 following 3 treatments with gemcitabine/Abraxane.  Restaging CT evaluation 09/29/2012 showed interval decrease in the left lower lobe pulmonary nodule; interval decrease in the size of the pancreatic head mass with decreasing regional lymphadenopathy and decreasing metastatic disease within the liver. No new or progressive disease noted.  CA 19-9 40775 on 07/07/2012, 1631 on  09/01/2012 and 2105 on 09/29/2012, ~2292 on 10/26/2012, ~ 5765 on 11/23/2012, ~5720 on 12/07/2012, 8383 on 01/04/2013.  Cycle 1 FOLFOX  10/12/2012, cycle 2 10/26/2012, cycle 3 11/23/2012, cycle 4 12/07/2012, cycle 5 12/21/2012, cycle 6 01/04/2013.  Restaging CT evaluation 01/17/2013 showed multiple new parenchymal and subpleural nodules, nonspecific; increased pancreatic head mass/regional adenopathy; mixed response in the liver.  Initiation of Abraxane 01/18/2013.  03/07/2013 CA 19-9 mildly increased from previous value; further increased on 03/14/2013.  CA 19-9 improved on 03/21/2013.  Restaging CT 03/31/2013 with a mixed response in the liver, worsening of portal vein thrombosis.  CA 19-9 increased 04/20/2013.  Malignant ascites status post paracentesis 04/20/2013 with cytology showing malignant cells consistent with metastatic adenocarcinoma. Progressive disease in the pancreas and liver noted on the CT 05/03/2013 2. Pain secondary to #1.  3. Acute myocardial infarction 06/16/2012 status post PTCA/drug-eluting stent 4. Status post Port-A-Cath placement 07/16/2012. 5. Admission with high fever and chills on 08/11/2012. CT of the chest revealed multilobar infiltrates. He was diagnosed with chemotherapy induced pneumonitis. He improved with steroids. Chest x-ray 09/15/2012 showed continued improvement. 6. Mucositis following cycle 4 FOLFOX. The 5-FU was dose reduced beginning with cycle 5. 7. Thrombocytopenia secondary to chemotherapy. The oxaliplatin was dose reduced beginning with cycle 5 FOLFOX. 8. History of neutropenia secondary to chemotherapy 9. Intermittent nose bleeding-likely related to mucositis from chemotherapy and aspirin/brilinta 10. 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 11. 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 Progression noted on the CT 05/03/2013, now maintained on Lovenox 12. Distended abdomen with increased pain-? Secondary to progression of the  pancreas cancer with carcinomatosis versus portal hypertension. Paracentesis 04/20/2013 with cytology confirming malignant cells consistent with metastatic adenocarcinoma. 13. Early satiety/dysphagia-distal esophagus narrowing noted on the barium swallow 05/05/2013  The pain appears to be under better control at present. He can be discharged if he is tolerating liquids. We can refer him to GI if he continues to have dysphagia.    recommendations:  1. continue Lovenox anticoagulation 2. continue oral narcotic regimen 3. [facility] hospice referral for home care 4. Decrease IV fluids 5. liquids/mechanical soft diet, consider GI referral to evaluate the distal esophagus narrowing  He is stable for discharge from an oncology standpoint. He will followup as scheduled at the [facility]. I will be out until 05/16/2013. Please call oncology as needed.     LOS: 3 days   Randall [name]  05/06/2013, 8:48 AM

## 2013-05-06 NOTE — Progress Notes (Signed)
Patient instructed on administering lovenox injection as well as his daughter.  Patient observed in administering his daily dose and performed the administration without problem.  Discharge instructions given both to patient and his daughter, my chart already established, and verbalized his understanding.

## 2013-05-09 ENCOUNTER — Ambulatory Visit (HOSPITAL_COMMUNITY)
Admission: RE | Admit: 2013-05-09 | Discharge: 2013-05-09 | Disposition: A | Discharge status: Home / Self Care | Source: Ambulatory Visit | Attending: Oncology | Admitting: Oncology

## 2013-05-09 DIAGNOSIS — R188 Other ascites: Secondary | ICD-10-CM | POA: Insufficient documentation

## 2013-05-09 DIAGNOSIS — C259 Malignant neoplasm of pancreas, unspecified: Secondary | ICD-10-CM

## 2013-05-09 NOTE — Procedures (Signed)
Successful US guided paracentesis from LLQ.  Yielded 1.7 liters of yellow fluid.  No immediate complications.  Pt tolerated well.   Specimen was not sent for labs.  Tsosie Billing D PA-C 05/09/2013 11:38 AM

## 2013-05-11 ENCOUNTER — Telehealth: Payer: Self-pay | Admitting: *Deleted

## 2013-05-11 ENCOUNTER — Ambulatory Visit (INDEPENDENT_AMBULATORY_CARE_PROVIDER_SITE_OTHER): Payer: Medicare Other | Admitting: Gastroenterology

## 2013-05-11 ENCOUNTER — Encounter: Payer: Self-pay | Admitting: Gastroenterology

## 2013-05-11 ENCOUNTER — Encounter: Payer: Self-pay | Admitting: *Deleted

## 2013-05-11 VITALS — BP 118/58 | HR 60 | Ht 68.0 in | Wt 155.2 lb

## 2013-05-11 DIAGNOSIS — R933 Abnormal findings on diagnostic imaging of other parts of digestive tract: Secondary | ICD-10-CM | POA: Insufficient documentation

## 2013-05-11 DIAGNOSIS — R1310 Dysphagia, unspecified: Secondary | ICD-10-CM | POA: Insufficient documentation

## 2013-05-11 MED ORDER — RANITIDINE HCL 75 MG PO TABS: 75.0000 mg | ORAL_TABLET | Freq: Two times a day (BID) | ORAL | Status: DC

## 2013-05-11 MED ORDER — DOCUSATE SODIUM 100 MG PO CAPS: 100.0000 mg | ORAL_CAPSULE | Freq: Two times a day (BID) | ORAL | Status: AC

## 2013-05-11 MED ORDER — SIMETHICONE 125 MG PO CHEW: 125.0000 mg | CHEWABLE_TABLET | ORAL | Status: AC | PRN

## 2013-05-11 MED ORDER — BIOTENE DRY MOUTH MT LIQD: 15.0000 mL | OROMUCOSAL | Status: AC | PRN

## 2013-05-11 NOTE — Progress Notes (Signed)
Reviewed and agree with management.  He will need to hold Lovenox the evening before and on the day of procedure.  He to check with PCP about this. Sandy Salaam. Deatra Ina, M.D., [facility]

## 2013-05-11 NOTE — Progress Notes (Signed)
05/11/2013 Randall [name] [id number] 02/14/1941   History of Present Illness:  This is a pleasant 72 year old male who has metastatic pancreatic cancer to the liver.  Disease has progressed as seen on last CT scan 05/03/2013.  Has progressive ascites s/p several paracenteses recently along with initiation of diuretics just a couple of days ago.  Also has progressive thrombosis of the splenic vein, portal veins, and superior mesenteric vein for which he is on Lovenox in the evening.  He is here today with his daughter.  Comes in to the office for complaints of abrupt onset of dysphagia to both solids and liquids.  Recent esophagram showed the following:  IMPRESSION:  1. Single episode of silent aspiration into the trachea.  2. No focal mucosal lesions are identified. However, there is mild narrowing of the distal esophagus preventing the passage of a 13 mm barium tablet over 3 min of intermittent fluoroscopic observation.  3. Mild presbyesophagus and reflux.  4. Tiny Zenker's diverticulum.  His daughter says that home hospice is on board, but they are still deciding about any further treatment.  She said that he could have weeks to months without more treatment.   Current Medications, Allergies, Past Medical History, Past Surgical History, Family History and Social History were reviewed in [place] [facility] electronic medical record.   Physical Exam: BP 118/58  Pulse 60  Ht 5' 8" (1.727 m)  Wt 155 lb 3.2 oz (70.398 kg)  BMI 23.60 kg/m2 General: Well developed male in no acute distress Head: Normocephalic and atraumatic Eyes:  Sclerae anicteric, conjunctiva pink  Ears: Normal auditory acuity Lungs: Clear throughout to auscultation Heart: Regular rate and rhythm Abdomen: Soft, distended with ascites fluids.  Normal bowel sounds.  Tenderness in epigastrium without R/R/G. Musculoskeletal: Symmetrical with no gross deformities  Extremities: No edema  Neurological: Alert oriented x 4, grossly  non-focal Psychological:  Alert and cooperative. Normal mood and affect  Assessment and Recommendations: -Dysphagia:  Sudden onset to solids and liquids.  Esophagram showing mild distal esophageal narrowing preventing 13 mm barium tablet from passing. -Metastatic pancreatic cancer:  Home hospice is on board, but they are saying that he has weeks to months without any further treatment.  *Discussed with Dr. Kaplan.  Will schedule EGD with dilation at [facility]. 

## 2013-05-11 NOTE — Progress Notes (Signed)
Faxed med list update from [facility] with [facility]. Asking for Dr. Gearldine Shown thoughts on need for continued Lovenox daily?

## 2013-05-11 NOTE — Patient Instructions (Addendum)
You have been scheduled for an endoscopy with Dr. Deatra Ina at [facility]. Please follow written instructions given to you at your visit today. If you use inhalers (even only as needed), please bring them with you on the day of your procedure. Your physician has requested that you go to www.startemmi.com and enter the access code given to you at your visit today. This web site gives a general overview about your procedure. However, you should still follow specific instructions given to you by our office regarding your preparation for the procedure.

## 2013-05-11 NOTE — Telephone Encounter (Signed)
Per Janett Billow call patient and advise what Dr. Deatra Ina said, note below:::  Inda Castle, MD at 05/11/2013 11:44 AM     Status: Signed        Reviewed and agree with management. He will need to hold Lovenox the evening before and on the day of procedure. He to check with PCP about this.  Sandy Salaam. Deatra Ina, M.D., [facility]    I called patient and advised that evening before procedure do not take Lovenox injection. Patient verbalized understanding.

## 2013-05-12 ENCOUNTER — Encounter: Payer: Self-pay | Admitting: *Deleted

## 2013-05-12 ENCOUNTER — Telehealth: Payer: Self-pay | Admitting: *Deleted

## 2013-05-12 NOTE — Telephone Encounter (Signed)
Called at patient request to confirm that the 05/17/13 appointment is with Dr. Benay Spice and is not a paracentesis appointment. Daughter, Ander Purpura has returned home and other daughter is due to come in soon and stay with him until his endo procedure is done.  Also wanted MD aware that daughter, Ander Purpura has decreased his MS Contin to 15 mg every 12 hours as was ordered by the physician at [facility] on 04/26/13. Will update his medication list.

## 2013-05-12 NOTE — Telephone Encounter (Signed)
After inquiry by Hospice nurse, Maura yesterday regarding Lovenox; per Dr. Benay Spice : OK to stop lovenox if he is declining. Be sure to include patient/daughter in the decision to stop Lovenox. Left this message on nurse's voice mail. Also noted GI wants him to hold Lovenox day prior and day of his endo procedure. Dr. Benay Spice is OK with this also. Will also send My Chart message to patient.

## 2013-05-16 ENCOUNTER — Ambulatory Visit (HOSPITAL_COMMUNITY): Payer: Medicare Other

## 2013-05-17 ENCOUNTER — Telehealth: Payer: Self-pay | Admitting: Oncology

## 2013-05-17 ENCOUNTER — Ambulatory Visit (HOSPITAL_BASED_OUTPATIENT_CLINIC_OR_DEPARTMENT_OTHER): Payer: Medicare Other | Admitting: Oncology

## 2013-05-17 ENCOUNTER — Ambulatory Visit: Payer: Self-pay | Admitting: Oncology

## 2013-05-17 ENCOUNTER — Other Ambulatory Visit: Payer: Self-pay | Admitting: *Deleted

## 2013-05-17 VITALS — BP 120/67 | HR 79 | Temp 97.5°F | Resp 18 | Ht 68.0 in | Wt 150.7 lb

## 2013-05-17 DIAGNOSIS — C787 Secondary malignant neoplasm of liver and intrahepatic bile duct: Secondary | ICD-10-CM

## 2013-05-17 DIAGNOSIS — C250 Malignant neoplasm of head of pancreas: Secondary | ICD-10-CM

## 2013-05-17 DIAGNOSIS — C259 Malignant neoplasm of pancreas, unspecified: Secondary | ICD-10-CM

## 2013-05-17 DIAGNOSIS — G620 Drug-induced polyneuropathy: Secondary | ICD-10-CM

## 2013-05-17 DIAGNOSIS — R180 Malignant ascites: Secondary | ICD-10-CM

## 2013-05-17 DIAGNOSIS — D6959 Other secondary thrombocytopenia: Secondary | ICD-10-CM

## 2013-05-17 DIAGNOSIS — G893 Neoplasm related pain (acute) (chronic): Secondary | ICD-10-CM

## 2013-05-17 MED ORDER — MORPHINE SULFATE 15 MG PO TABS: 15.0000 mg | ORAL_TABLET | ORAL | Status: AC | PRN

## 2013-05-17 MED ORDER — MORPHINE SULFATE ER 15 MG PO TBCR: 15.0000 mg | EXTENDED_RELEASE_TABLET | Freq: Two times a day (BID) | ORAL | Status: AC

## 2013-05-17 NOTE — Progress Notes (Signed)
[facility]    OFFICE PROGRESS NOTE   INTERVAL HISTORY:   Mr. Harland returns for scheduled followup of pancreas cancer. He has enrolled in the [facility] hospice program. He is now maintained on MS Contin at a dose of 30 mg twice daily. His family request a decrease to 15 mg twice daily secondary to somnolence and confusion. He has only been using the MSIR once daily for breakthrough pain.  The abdomen is distended. He underwent a paracentesis for 1.7 L of fluid on 05/09/2013.  His chief complaint is solid dysphagia. This has progressed. He is tolerating liquids. He is scheduled to undergo an endoscopy by Dr. Deatra Ina on 05/18/2013.  He continues Lovenox anticoagulation. The neuropathy symptoms persist.  Objective:  Vital signs in last 24 hours:  Blood pressure 120/67, pulse 79, temperature 97.5 F (36.4 C), temperature source Oral, resp. rate 18, height 5\' 8"  (1.727 m), weight 150 lb 11.2 oz (68.357 kg), SpO2 97.00%.    Resp: Lungs clear bilaterally Cardio: Regular rate and rhythm GI: Distended, no hepatomegaly, no mass Vascular: Trace pitting edema at the ankle bilaterally  Skin: Ecchymoses at the low abdominal wall injection sites   Portacath/PICC-without erythema  Lab Results:  Lab Results  Component Value Date   WBC 8.9 05/04/2013   HGB 8.2* 05/04/2013   HCT 25.7* 05/04/2013   MCV 88.9 05/04/2013   PLT 176 05/04/2013   NEUTROABS 9.3* 05/03/2013      Medications: I have reviewed the patient's current medications.  Assessment/Plan: 1. Metastatic pancreas cancer. The clinical presentation, elevated CA 19-9 and abdominal CT consistent with a diagnosis of metastatic pancreas cancer. 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 Initiation of gemcitabine/Abraxane on 07/21/2012. Last treated on 08/04/2012.  The CA 19-9 was improved on 09/01/2012 following 3 treatments with  gemcitabine/Abraxane.  Restaging CT evaluation 09/29/2012 showed interval decrease in the left lower lobe pulmonary nodule; interval decrease in the size of the pancreatic head mass with decreasing regional lymphadenopathy and decreasing metastatic disease within the liver. No new or progressive disease noted.  CA 19-9 40775 on 07/07/2012, 1631 on 09/01/2012 and 2105 on 09/29/2012, ~2292 on 10/26/2012, ~ 5765 on 11/23/2012, ~5720 on 12/07/2012, 8383 on 01/04/2013.  Cycle 1 FOLFOX 10/12/2012, cycle 2 10/26/2012, cycle 3 11/23/2012, cycle 4 12/07/2012, cycle 5 12/21/2012, cycle 6 01/04/2013.  Restaging CT evaluation 01/17/2013 showed multiple new parenchymal and subpleural nodules, nonspecific; increased pancreatic head mass/regional adenopathy; mixed response in the liver.  Initiation of Abraxane 01/18/2013.  03/07/2013 CA 19-9 mildly increased from previous value; further increased on 03/14/2013.  CA 19-9 improved on 03/21/2013.  Restaging CT 03/31/2013 with a mixed response in the liver, worsening of portal vein thrombosis.  CA 19-9 increased 04/20/2013.  Malignant ascites status post paracentesis 04/20/2013 with cytology showing malignant cells consistent with metastatic adenocarcinoma.  Progressive disease in the pancreas and liver noted on the CT 05/03/2013 2. Pain secondary to #1.  3. Acute myocardial infarction 06/16/2012 status post PTCA/drug-eluting stent 4. Status post Port-A-Cath placement 07/16/2012. 5. Admission with high fever and chills on 08/11/2012. CT of the chest revealed multilobar infiltrates. He was diagnosed with chemotherapy induced pneumonitis. He improved with steroids. Chest x-ray 09/15/2012 showed continued improvement. 6. Mucositis following cycle 4 FOLFOX. The 5-FU was dose reduced beginning with cycle 5. 7. Thrombocytopenia secondary to chemotherapy. The oxaliplatin was dose reduced beginning with cycle 5 FOLFOX. 8. History of neutropenia secondary to  chemotherapy 9.  History of Intermittent nose bleeding-likely related to mucositis from chemotherapy and aspirin/brilinta 10. 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 11. 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 Progression noted on the CT 05/03/2013, now maintained on Lovenox 12. Distended abdomen with increased pain-? Secondary to progression of the pancreas cancer with carcinomatosis versus portal hypertension. Paracentesis 04/20/2013 with cytology confirming malignant cells consistent with metastatic adenocarcinoma. 13. Early satiety/dysphagia-distal esophagus narrowing noted on the barium swallow 05/05/2013-scheduled for an endoscopy 05/18/2013     Disposition:  Mr. Cuffee is now enrolled in the [facility] hospice program. We decided to decrease the MS Contin to 15 mg twice daily. He will continue MSIR for breakthrough pain.  We discontinued Colace, Lasix, and Aldactone today. He will contact us if he would like another palliative paracentesis.  Mr. Gheen is scheduled to undergo a diagnostic endoscopy 05/18/2013. He could have tumor extrinsically compressing the esophagus/stomach or metastatic disease involving the mucosa. He could also have another primary tumor.  Mr. Ganser will return for an office visit in 2 weeks. He will contact us in the interim as needed.Marland Kitchen   Betsy Coder, MD  05/17/2013  2:18 PM

## 2013-05-17 NOTE — Telephone Encounter (Signed)
gv adn printed appt sched and avs for pt for April  °

## 2013-05-18 ENCOUNTER — Encounter (HOSPITAL_COMMUNITY)
Admission: RE | Disposition: A | Discharge status: Home / Self Care | Payer: Self-pay | Source: Ambulatory Visit | Attending: Gastroenterology

## 2013-05-18 ENCOUNTER — Encounter: Payer: Self-pay | Admitting: Gastroenterology

## 2013-05-18 ENCOUNTER — Ambulatory Visit (HOSPITAL_COMMUNITY)
Admission: RE | Admit: 2013-05-18 | Discharge: 2013-05-18 | Disposition: A | Discharge status: Home / Self Care | Source: Ambulatory Visit | Attending: Gastroenterology | Admitting: Gastroenterology

## 2013-05-18 ENCOUNTER — Encounter (HOSPITAL_COMMUNITY): Payer: Self-pay | Admitting: *Deleted

## 2013-05-18 DIAGNOSIS — R1310 Dysphagia, unspecified: Secondary | ICD-10-CM | POA: Insufficient documentation

## 2013-05-18 DIAGNOSIS — K55059 Acute (reversible) ischemia of intestine, part and extent unspecified: Secondary | ICD-10-CM | POA: Insufficient documentation

## 2013-05-18 DIAGNOSIS — K319 Disease of stomach and duodenum, unspecified: Secondary | ICD-10-CM | POA: Insufficient documentation

## 2013-05-18 DIAGNOSIS — C787 Secondary malignant neoplasm of liver and intrahepatic bile duct: Secondary | ICD-10-CM | POA: Insufficient documentation

## 2013-05-18 DIAGNOSIS — D7389 Other diseases of spleen: Secondary | ICD-10-CM | POA: Insufficient documentation

## 2013-05-18 DIAGNOSIS — I81 Portal vein thrombosis: Secondary | ICD-10-CM | POA: Insufficient documentation

## 2013-05-18 DIAGNOSIS — K228 Other specified diseases of esophagus: Secondary | ICD-10-CM | POA: Diagnosis not present

## 2013-05-18 DIAGNOSIS — C259 Malignant neoplasm of pancreas, unspecified: Secondary | ICD-10-CM | POA: Diagnosis not present

## 2013-05-18 DIAGNOSIS — R933 Abnormal findings on diagnostic imaging of other parts of digestive tract: Secondary | ICD-10-CM

## 2013-05-18 DIAGNOSIS — K219 Gastro-esophageal reflux disease without esophagitis: Secondary | ICD-10-CM | POA: Diagnosis not present

## 2013-05-18 DIAGNOSIS — K208 Other esophagitis without bleeding: Secondary | ICD-10-CM | POA: Diagnosis not present

## 2013-05-18 DIAGNOSIS — K2289 Other specified disease of esophagus: Secondary | ICD-10-CM | POA: Diagnosis not present

## 2013-05-18 DIAGNOSIS — K209 Esophagitis, unspecified without bleeding: Secondary | ICD-10-CM

## 2013-05-18 DIAGNOSIS — R188 Other ascites: Secondary | ICD-10-CM | POA: Diagnosis not present

## 2013-05-18 DIAGNOSIS — K225 Diverticulum of esophagus, acquired: Secondary | ICD-10-CM | POA: Diagnosis not present

## 2013-05-18 DIAGNOSIS — K315 Obstruction of duodenum: Secondary | ICD-10-CM

## 2013-05-18 HISTORY — PX: ESOPHAGOGASTRODUODENOSCOPY (EGD) WITH ESOPHAGEAL DILATION: SHX5812

## 2013-05-18 SURGERY — ESOPHAGOGASTRODUODENOSCOPY (EGD) WITH ESOPHAGEAL DILATION
Anesthesia: Moderate Sedation

## 2013-05-18 MED ORDER — PANTOPRAZOLE SODIUM 40 MG PO TBEC
40.0000 mg | DELAYED_RELEASE_TABLET | Freq: Every day | ORAL | Status: DC
  Filled 2013-05-18: qty 1

## 2013-05-18 MED ORDER — MIDAZOLAM HCL 10 MG/2ML IJ SOLN
INTRAMUSCULAR | Status: AC
  Filled 2013-05-18: qty 2

## 2013-05-18 MED ORDER — DIPHENHYDRAMINE HCL 50 MG/ML IJ SOLN
INTRAMUSCULAR | Status: AC
  Filled 2013-05-18: qty 1

## 2013-05-18 MED ORDER — FENTANYL CITRATE 0.05 MG/ML IJ SOLN
INTRAMUSCULAR | Status: DC | PRN
  Administered 2013-05-18: 25 ug via INTRAVENOUS

## 2013-05-18 MED ORDER — HEPARIN SOD (PORK) LOCK FLUSH 100 UNIT/ML IV SOLN: 500.0000 [IU] | INTRAVENOUS | Status: DC | PRN

## 2013-05-18 MED ORDER — MIDAZOLAM HCL 5 MG/5ML IJ SOLN
INTRAMUSCULAR | Status: DC | PRN
  Administered 2013-05-18 (×4): 1 mg via INTRAVENOUS

## 2013-05-18 MED ORDER — PANTOPRAZOLE SODIUM 40 MG PO TBEC
40.0000 mg | DELAYED_RELEASE_TABLET | Freq: Every day | ORAL | Status: AC
Start: 2013-05-19 — End: ?

## 2013-05-18 MED ORDER — BUTAMBEN-TETRACAINE-BENZOCAINE 2-2-14 % EX AERO
INHALATION_SPRAY | CUTANEOUS | Status: DC | PRN
  Administered 2013-05-18: 2 via TOPICAL

## 2013-05-18 MED ORDER — SODIUM CHLORIDE 0.9 % IV SOLN
INTRAVENOUS | Status: DC
  Administered 2013-05-18: 500 mL via INTRAVENOUS

## 2013-05-18 MED ORDER — FENTANYL CITRATE 0.05 MG/ML IJ SOLN
INTRAMUSCULAR | Status: AC
  Filled 2013-05-18: qty 2

## 2013-05-18 NOTE — Discharge Instructions (Signed)
Resume Lovenox today

## 2013-05-18 NOTE — H&P (View-Only) (Signed)
05/11/2013 Randall [name] [id number] 02/27/1941   History of Present Illness:  This is a pleasant 73 year old male who has metastatic pancreatic cancer to the liver.  Disease has progressed as seen on last CT scan 05/03/2013.  Has progressive ascites s/p several paracenteses recently along with initiation of diuretics just a couple of days ago.  Also has progressive thrombosis of the splenic vein, portal veins, and superior mesenteric vein for which he is on Lovenox in the evening.  He is here today with his daughter.  Comes in to the office for complaints of abrupt onset of dysphagia to both solids and liquids.  Recent esophagram showed the following:  IMPRESSION:  1. Single episode of silent aspiration into the trachea.  2. No focal mucosal lesions are identified. However, there is mild narrowing of the distal esophagus preventing the passage of a 13 mm barium tablet over 3 min of intermittent fluoroscopic observation.  3. Mild presbyesophagus and reflux.  4. Tiny Zenker's diverticulum.  His daughter says that home hospice is on board, but they are still deciding about any further treatment.  She said that he could have weeks to months without more treatment.   Current Medications, Allergies, Past Medical History, Past Surgical History, Family History and Social History were reviewed in [employer] record.   Physical Exam: BP 118/58  Pulse 60  Ht 5\' 8"  (1.727 m)  Wt 155 lb 3.2 oz (70.398 kg)  BMI 23.60 kg/m2 General: Well developed male in no acute distress Head: Normocephalic and atraumatic Eyes:  Sclerae anicteric, conjunctiva pink  Ears: Normal auditory acuity Lungs: Clear throughout to auscultation Heart: Regular rate and rhythm Abdomen: Soft, distended with ascites fluids.  Normal bowel sounds.  Tenderness in epigastrium without R/R/G. Musculoskeletal: Symmetrical with no gross deformities  Extremities: No edema  Neurological: Alert oriented x 4, grossly  non-focal Psychological:  Alert and cooperative. Normal mood and affect  Assessment and Recommendations: -Dysphagia:  Sudden onset to solids and liquids.  Esophagram showing mild distal esophageal narrowing preventing 13 mm barium tablet from passing. -Metastatic pancreatic cancer:  Home hospice is on board, but they are saying that he has weeks to months without any further treatment.  *Discussed with Dr. Deatra Ina.  Will schedule EGD with dilation at [facility] hospital.

## 2013-05-18 NOTE — Interval H&P Note (Signed)
History and Physical Interval Note:  05/18/2013 1:19 PM  Randall [name]  has presented today for surgery, with the diagnosis of Dysphagia  Abnormal esophagram   The various methods of treatment have been discussed with the patient and family. After consideration of risks, benefits and other options for treatment, the patient has consented to  Procedure(s): ESOPHAGOGASTRODUODENOSCOPY (EGD) WITH ESOPHAGEAL DILATION (N/A) as a surgical intervention .  The patient's history has been reviewed, patient examined, no change in status, stable for surgery.  I have reviewed the patient's chart and labs.  Questions were answered to the patient's satisfaction.     The recent H&P (dated *05/11/13**) was reviewed, the patient was examined and there is no change in the patients condition since that H&P was completed.   [name]  05/18/2013, 1:19 PM   [name]

## 2013-05-18 NOTE — Op Note (Signed)
[facility] [place], [address]   ENDOSCOPY PROCEDURE REPORT  PATIENT: [name], Randall  MR#: [id number] BIRTHDATE: 08/15/1940 , 72  yrs. old GENDER: Male ENDOSCOPIST: Inda Castle, MD ASSISTANT:   Sharon Mt, Endo Technician Wray Kearns, RN REFERRED WK:[id number] Edrick Kins, M.D. PROCEDURE DATE:  05/18/2013 PROCEDURE:   EGD with balloon dilatation ASA CLASS:   Class III INDICATIONS:dysphagia.   barium swallow demonstrates slight narrowing in the distal esophagus MEDICATIONS: These medications were titrated to patient response per physician's verbal order, Versed 4 mg IV, and Fentanyl 25 mcg IV  TOPICAL ANESTHETIC:   Cetacaine Spray  DESCRIPTION OF PROCEDURE:   After the risks benefits and alternatives of the procedure were thoroughly explained, informed consent was obtained.  The     endoscope was introduced through the mouth  and advanced to the third portion of the duodenum ,      The instrument was slowly withdrawn as the mucosa was carefully examined.    There were a few erosions in the distal esophagus.  The 9 mm gastroscope easily traversed the distal esophagus that did not appear strictured. There seemed to be some narrowing or stenosis at the junction between the second portion the duodenum and the duodenal bulb.  The scope was passed with resistance.  There were at least 2 malignant appearing nodules in the third portion the duodenum.   There were a few erosions in the distal esophagus.  The 9 mm gastroscope easily traversed the distal esophagus that did not appear strictured. There seemed to be some narrowing or stenosis at the junction between the second portion the duodenum and the duodenal bulb.  The scope was passed with resistance.  There were at least 2 malignant appearing nodules in the third portion the duodenum.     Dilation was then performed at the distal esophagus  Dilator:Balloon Size:18  Reststance:minimal  Heme:none  The 18 mm balloon dilator was inflated while it traversed the GE junction. It was then pulled more proximally and inflated in the distal one third esophagus at 38 cm and at 35 cm.  COMPLICATIONS: There were no complications. ENDOSCOPIC IMPRESSION: 1.   esophagitis 2.  malignant appearing nodules in duodenum 3.  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  erosive esophagitis 5.  dysphagia without obvious stricture-status post balloon dilation   RECOMMENDATIONS: OV prn  eSigned:  Inda Castle, MD 05/18/2013 1:44 PM  CC:  PATIENT NAME:  [name], Randall MR#: [id number]

## 2013-05-19 ENCOUNTER — Ambulatory Visit (HOSPITAL_COMMUNITY)
Admit: 2013-05-19 | Discharge: 2013-05-19 | Disposition: A | Discharge status: Home / Self Care | Attending: Gastroenterology | Admitting: Gastroenterology

## 2013-05-19 ENCOUNTER — Encounter (HOSPITAL_COMMUNITY): Payer: Self-pay | Admitting: Gastroenterology

## 2013-05-19 DIAGNOSIS — C259 Malignant neoplasm of pancreas, unspecified: Secondary | ICD-10-CM | POA: Insufficient documentation

## 2013-05-20 ENCOUNTER — Telehealth: Payer: Self-pay | Admitting: Gastroenterology

## 2013-05-20 ENCOUNTER — Telehealth: Payer: Self-pay | Admitting: *Deleted

## 2013-05-20 NOTE — Telephone Encounter (Signed)
Pts daughter is calling for upper gi results from yesterday. Please advise.

## 2013-05-20 NOTE — Telephone Encounter (Signed)
Pt's family aware.

## 2013-05-20 NOTE — Telephone Encounter (Signed)
No gastric outlet obstruction.  Nothing more to do endoscopically unless he develops an obstruction.

## 2013-05-20 NOTE — Telephone Encounter (Signed)
Daughter, Randall [name] is calling to request appointment with Dr. Benay Spice to discuss the results of Dr. Kelby Fam procedure. Unable to see [name] as a contact or one to release medical information to. Spoke with patient and he gave verbal permission for our office to release medical information to his daughter, Randall [name].  Made [name] aware their request will be put on MD desk. She is requesting appointment on Monday (anytime) or Tuesday (am).

## 2013-05-23 ENCOUNTER — Telehealth: Payer: Self-pay | Admitting: *Deleted

## 2013-05-23 NOTE — Telephone Encounter (Signed)
Dr. Deatra Ina changed his Zantac to Protonix 40 mg daily. Family requests antiemetic change from Compazine to Zofran 4 mg every 6 hours prn. Changes made per family request. Still is vomiting some. Daughter asking why he is having nausea and vomiting? Wants to know how to get him to eat? Daughter sill requesting to see MD today or tomorrow am. Per Dr. Benay Spice : N/V, anorexia is due to his cancer. Will not be able to see him in accordance with the day/time she requests. Dr. Benay Spice plans to call her today or tomorrow and discuss situation to see if this can clear up her questions. Sherril Croon and made her aware that MD will call her in next couple days.

## 2013-05-24 ENCOUNTER — Encounter: Payer: Self-pay | Admitting: Oncology

## 2013-05-24 ENCOUNTER — Telehealth: Payer: Self-pay | Admitting: *Deleted

## 2013-05-24 NOTE — Telephone Encounter (Signed)
Left detailed voice mail for daughter: Therapeutic paracentesis at [facility] on 3/25 at 0945/1000. Call central scheduling [phone] if this time is not convenient for her to take him.  Dr. Benay Spice called daughter yesterday evening and discussed his symptoms and disease status, prognosis in detail.

## 2013-05-24 NOTE — Progress Notes (Signed)
Put daughter's fmla forms on nurse's desk.

## 2013-05-25 ENCOUNTER — Encounter: Payer: Self-pay | Admitting: Oncology

## 2013-05-25 ENCOUNTER — Ambulatory Visit (HOSPITAL_COMMUNITY)
Admission: RE | Admit: 2013-05-25 | Discharge: 2013-05-25 | Disposition: A | Discharge status: Home / Self Care | Payer: Medicare Other | Source: Ambulatory Visit | Attending: Oncology | Admitting: Oncology

## 2013-05-25 DIAGNOSIS — C259 Malignant neoplasm of pancreas, unspecified: Secondary | ICD-10-CM

## 2013-05-25 DIAGNOSIS — R188 Other ascites: Secondary | ICD-10-CM | POA: Insufficient documentation

## 2013-05-25 NOTE — Progress Notes (Signed)
Faxed fmla forms to [employer] @ [phone]

## 2013-05-25 NOTE — Procedures (Signed)
Successful US guided paracentesis from left mid/lower quadrant.  Yielded 4.6 liters of blood tinged fluid.  No immediate complications.  Pt tolerated well.   Specimen was not sent for labs.  Tsosie Billing D PA-C 05/25/2013 10:33 AM

## 2013-05-26 ENCOUNTER — Telehealth: Payer: Self-pay | Admitting: *Deleted

## 2013-05-26 NOTE — Telephone Encounter (Signed)
Message from [place] with hospice reporting pt's family is requesting IV Fluids to see if this helps with his confusion. Hospice MD ordered Lactulose TID to address the confusion. Reviewed with Dr. Benay Spice, MD does not recommend IV fluids, will not likely help. Maura made aware.

## 2013-05-27 ENCOUNTER — Encounter: Payer: Self-pay | Admitting: Nurse Practitioner

## 2013-06-01 ENCOUNTER — Ambulatory Visit (HOSPITAL_BASED_OUTPATIENT_CLINIC_OR_DEPARTMENT_OTHER): Payer: Medicare Other | Admitting: Nurse Practitioner

## 2013-06-01 ENCOUNTER — Telehealth: Payer: Self-pay | Admitting: Oncology

## 2013-06-01 VITALS — BP 95/64 | HR 69 | Temp 96.9°F | Resp 17 | Ht 68.0 in | Wt 128.8 lb

## 2013-06-01 DIAGNOSIS — T451X5A Adverse effect of antineoplastic and immunosuppressive drugs, initial encounter: Secondary | ICD-10-CM

## 2013-06-01 DIAGNOSIS — C787 Secondary malignant neoplasm of liver and intrahepatic bile duct: Secondary | ICD-10-CM

## 2013-06-01 DIAGNOSIS — R180 Malignant ascites: Secondary | ICD-10-CM

## 2013-06-01 DIAGNOSIS — R6881 Early satiety: Secondary | ICD-10-CM

## 2013-06-01 DIAGNOSIS — D6959 Other secondary thrombocytopenia: Secondary | ICD-10-CM

## 2013-06-01 DIAGNOSIS — C259 Malignant neoplasm of pancreas, unspecified: Secondary | ICD-10-CM

## 2013-06-01 DIAGNOSIS — C250 Malignant neoplasm of head of pancreas: Secondary | ICD-10-CM

## 2013-06-01 NOTE — Telephone Encounter (Signed)
gv pt appt schedule for april.  °

## 2013-06-01 NOTE — Progress Notes (Addendum)
[facility] OFFICE PROGRESS NOTE   Diagnosis:  Metastatic pancreas cancer.  INTERVAL HISTORY:   [name] returns for scheduled followup. He and his daughter repots abdominal pain is well-controlled with MS Contin twice daily and MSIR typically 1 time a day. The pain is mainly located on the right side of the abdomen. He noted temporary relief of abdominal distention following the paracentesis 05/25/2013. He does not feel he needs a paracentesis today. Appetite is poor. He is losing weight. Last bowel movement was 4-5 days ago. His daughter has not noted any significant improvement in malaise or mild confusion since beginning lactulose. He reports persistent neuropathy symptoms involving the hands. He has discontinued several medications.  Objective:  Vital signs in last 24 hours:  Blood pressure 95/64, pulse 69, temperature 96.9 F (36.1 C), temperature source Oral, resp. rate 17, height 5\' 8"  (1.727 m), weight 128 lb 12.8 oz (58.423 kg), SpO2 96.00%.    HEENT: Bitemporal wasting. White coating over tongue. Resp: Lungs clear. Cardio: Regular cardiac rhythm. GI: Abdomen distended. Vascular: Pitting edema at the lower leg/ankles bilaterally. Skin: Ecchymoses scattered over the lower abdominal wall.   Portacath/PICC-without erythema.  Lab Results:  Lab Results  Component Value Date   WBC 8.9 05/04/2013   HGB 8.2* 05/04/2013   HCT 25.7* 05/04/2013   MCV 88.9 05/04/2013   PLT 176 05/04/2013   NEUTROABS 9.3* 05/03/2013    Lab Results  Component Value Date   NA 134* 05/04/2013    Lab Results  Component Value Date   CEA 24.7* 07/07/2012    Imaging:  No results found.  Medications: I have reviewed the patient's current medications.  Assessment/Plan: 1. Metastatic pancreas cancer. The clinical presentation, elevated CA 19-9 and abdominal CT consistent with a diagnosis of metastatic pancreas cancer. Ultrasound-guided biopsy of a liver lesion on 07/16/2012 confirmed  metastatic adenocarcinoma consistent with metastatic pancreas cancer. Initiation of gemcitabine/Abraxane on 07/21/2012. Last treated on 08/04/2012.  The CA 19-9 was improved on 09/01/2012 following 3 treatments with gemcitabine/Abraxane.  Restaging CT evaluation 09/29/2012 showed interval decrease in the left lower lobe pulmonary nodule; interval decrease in the size of the pancreatic head mass with decreasing regional lymphadenopathy and decreasing metastatic disease within the liver. No new or progressive disease noted.  CA 19-9 40775 on 07/07/2012, 1631 on 09/01/2012 and 2105 on 09/29/2012, ~2292 on 10/26/2012, ~ 5765 on 11/23/2012, ~5720 on 12/07/2012, 8383 on 01/04/2013.  Cycle 1 FOLFOX 10/12/2012, cycle 2 10/26/2012, cycle 3 11/23/2012, cycle 4 12/07/2012, cycle 5 12/21/2012, cycle 6 01/04/2013.  Restaging CT evaluation 01/17/2013 showed multiple new parenchymal and subpleural nodules, nonspecific; increased pancreatic head mass/regional adenopathy; mixed response in the liver.  Initiation of Abraxane 01/18/2013.  03/07/2013 CA 19-9 mildly increased from previous value; further increased on 03/14/2013.  CA 19-9 improved on 03/21/2013.  Restaging CT 03/31/2013 with a mixed response in the liver, worsening of portal vein thrombosis.  CA 19-9 increased 04/20/2013.  Malignant ascites status post paracentesis 04/20/2013 with cytology showing malignant cells consistent with metastatic adenocarcinoma.  Progressive disease in the pancreas and liver noted on the CT 05/03/2013 2. Pain secondary to #1.  3. Acute myocardial infarction 06/16/2012 status post PTCA/drug-eluting stent 4. Status post Port-A-Cath placement 07/16/2012. 5. Admission with high fever and chills on 08/11/2012. CT of the chest revealed multilobar infiltrates. He was diagnosed with chemotherapy induced pneumonitis. He improved with steroids. Chest x-ray 09/15/2012 showed continued improvement. 6. Mucositis following cycle 4 FOLFOX.  The 5-FU was dose reduced beginning  with cycle 5. 7. Thrombocytopenia secondary to chemotherapy. The oxaliplatin was dose reduced beginning with cycle 5 FOLFOX. 8. History of neutropenia secondary to chemotherapy 9. History of Intermittent nose bleeding-likely related to mucositis from chemotherapy and aspirin/brilinta 10. 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 11. 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 Progression noted on the CT 05/03/2013, previously maintained on Lovenox. He discontinued the Lovenox several days ago. 12. Distended abdomen with increased pain-? Secondary to progression of the pancreas cancer with carcinomatosis versus portal hypertension. Paracentesis 04/20/2013 with cytology confirming malignant cells consistent with metastatic adenocarcinoma. Paracentesis 05/25/2013. 13. Early satiety/dysphagia-distal esophagus narrowing noted on the barium swallow 05/05/2013. Upper endoscopy 05/18/2013 showed esophagitis; malignant appearing nodules in the duodenum; probable stenosis in the duodenum; erosive esophagitis. No obvious stricture. Balloon dilatation performed at the distal esophagus. 14. Constipation. He will increase lactulose to 30 cc twice daily, begin MiraLAX once daily and continue the stool softener.   Disposition: His performance status continues to decline. Hospice is following closely. We will continue to follow on a supportive/comfort care approach. Adjustments made to the laxative regimen as above. He will return for a followup visit in 2 weeks. He and his daughter understand they can call the office at any time with problems, questions or concerns.  Patient seen with Dr. Benay Spice. 25 minutes were spent face-to-face at today's visit with the majority of that time involved in counseling/coordination of  care.    Ned Card ANP/GNP-BC   06/01/2013  10:02 AM This was a shared visit with Ned Card. Randall [name] was interviewed and examined. He is declining. He has a poor prognosis for survival beyond a few weeks.  Julieanne Manson, M.D.

## 2013-06-06 ENCOUNTER — Telehealth: Payer: Self-pay | Admitting: *Deleted

## 2013-06-06 ENCOUNTER — Other Ambulatory Visit: Payer: Self-pay | Admitting: *Deleted

## 2013-06-06 DIAGNOSIS — C259 Malignant neoplasm of pancreas, unspecified: Secondary | ICD-10-CM

## 2013-06-06 MED ORDER — PROCHLORPERAZINE 25 MG RE SUPP: 25.0000 mg | Freq: Two times a day (BID) | RECTAL | Status: AC | PRN

## 2013-06-06 MED ORDER — MORPHINE SULFATE (CONCENTRATE) 20 MG/ML PO SOLN: 10.0000 mg | ORAL | Status: AC | PRN

## 2013-06-06 MED ORDER — ONDANSETRON 4 MG PO TBDP: 4.0000 mg | ORAL_TABLET | Freq: Four times a day (QID) | ORAL | Status: AC | PRN

## 2013-06-06 NOTE — Telephone Encounter (Signed)
Made Hospice RN aware of MD orders for nausea and pain. Roxanol script faxed to [facility] on [address] and antiemetics were electronically sent. Nurse reports she spoke with patient today and he does not want anything to "prolong" his life- says he just wants this all to be over. Daughter, Ander Purpura [phone]) had called earlier today to ask about how to hydrate him? Per Dr. Consuello Bossier to follow patient's wishes.

## 2013-06-06 NOTE — Telephone Encounter (Signed)
1. Hospice called requesting zofran 4mg  ODT and a suppository for nausea and vomiting.   2. Patient needs to be more comfortable.  He is having general body pain, abdominal and back pain and is not always able to get the MS Contin 50 mg down every time.  Would like to get roxanol to have a liquid in the home to use if needed.   3. The lorazepam is not as effective as the alprazolam.  We would like to go back to alprazolam 0.5 mg TID prn for anxiety.  Uses [facility] on [address].  Therese RN with Hospice can be reached at [phone].     The patient, his family and Randall [name] have spoken and learned from [name] that he is tired and wants comfort.

## 2013-06-13 ENCOUNTER — Telehealth: Payer: Self-pay | Admitting: *Deleted

## 2013-06-13 NOTE — Telephone Encounter (Signed)
Reports patient died peacefully last night in home.

## 2013-06-15 ENCOUNTER — Ambulatory Visit: Payer: Self-pay | Admitting: Nurse Practitioner

## 2013-06-20 ENCOUNTER — Ambulatory Visit: Payer: Self-pay | Admitting: Internal Medicine

## 2013-07-01 DEATH — deceased

## 2013-10-25 ENCOUNTER — Other Ambulatory Visit: Payer: Self-pay | Admitting: *Deleted

## 2014-02-09 ENCOUNTER — Encounter (HOSPITAL_COMMUNITY): Payer: Self-pay | Admitting: Cardiology

## 2015-02-23 IMAGING — CT CT ANGIO ABDOMEN
3 of 10 series · 12 of 46 positions shown, 18 images · IV contrast (APPLIED)
Comparison: Ultrasound from earlier the same day

CLINICAL DATA: Recent myocardial infarction post coronary
stenting.  Abdominal pain.  Multiple liver lesions on recent
ultrasound.

CT ANGIOGRAPHY ABDOMEN
TECHNIQUE: Multidetector CT imaging of the abdomen a  was
performed using the standard protocol during bolus administration
of intravenous contrast.  Multiplanar reconstructed images
including MIPs were obtained and reviewed to evaluate the vascular
anatomy.
Contrast: 100mL OMNIPAQUE IOHEXOL 300 MG/ML  SOLN

[Series 3: arterial 3.0 b31f st · axial · arterial · 0.69mm/px · z∈[-280,-192]mm · 5 of 67 slices shown]
[im 8/67  soft-tissue]
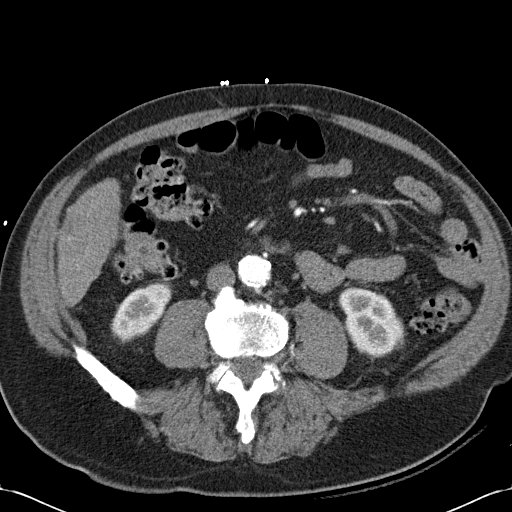
[im 15/67  soft-tissue]
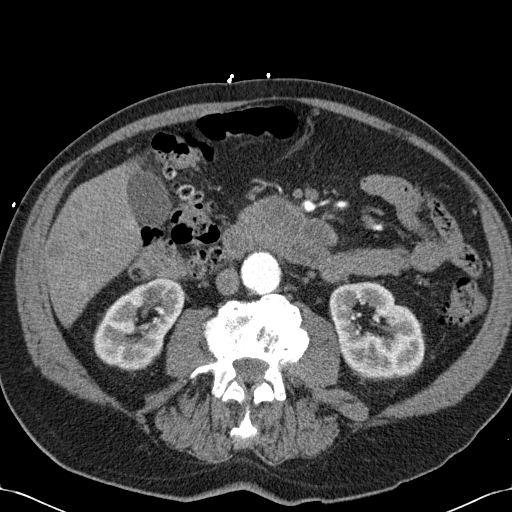
[im 23/67  soft-tissue]
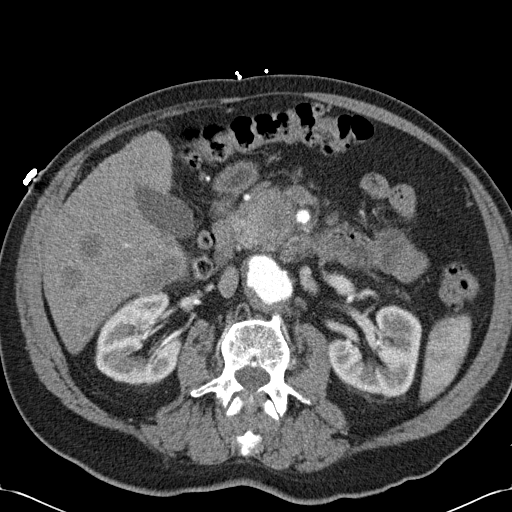
[im 30/67  soft-tissue]
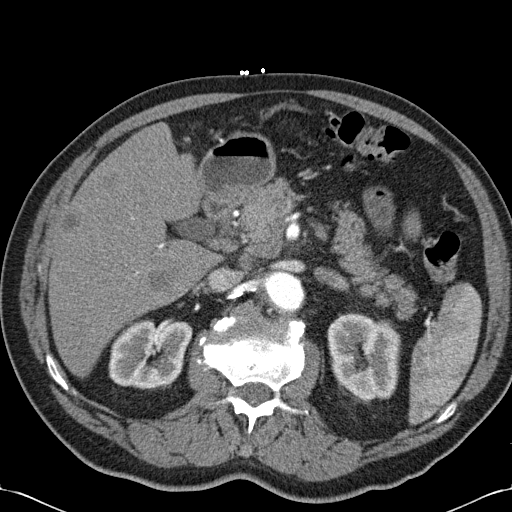
[im 37/67  soft-tissue]
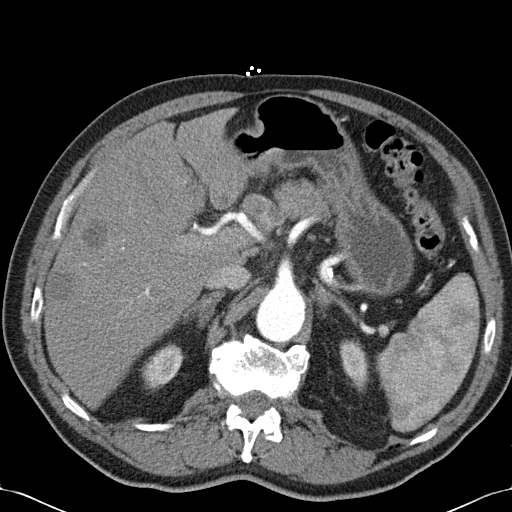

[Series 6: venous 5.0 b31f st · axial · portal-venous · 0.69mm/px · z∈[-264,-144]mm · 4 of 40 slices shown, 9 images]
[im 8/40  soft-tissue]
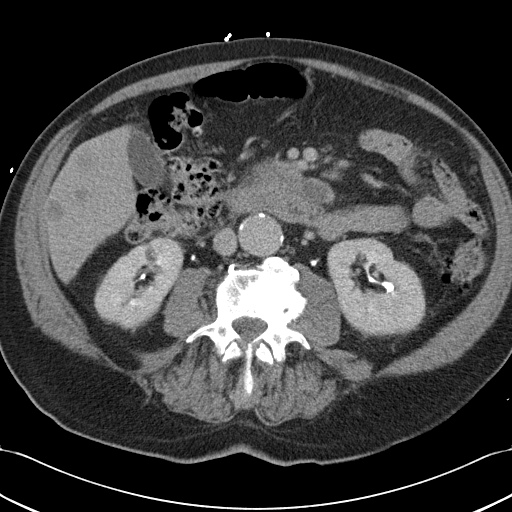
[im 8/40  lung]
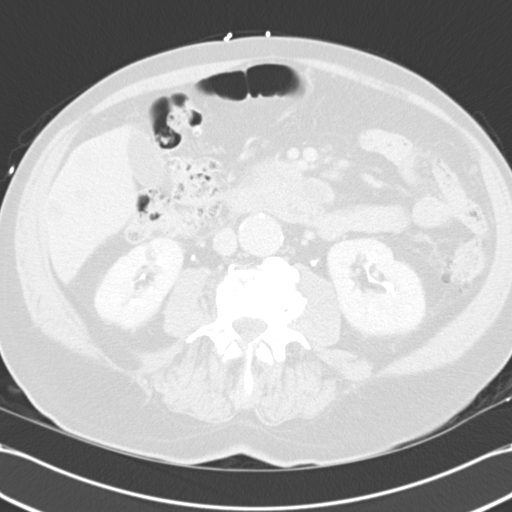
[im 8/40  bone]
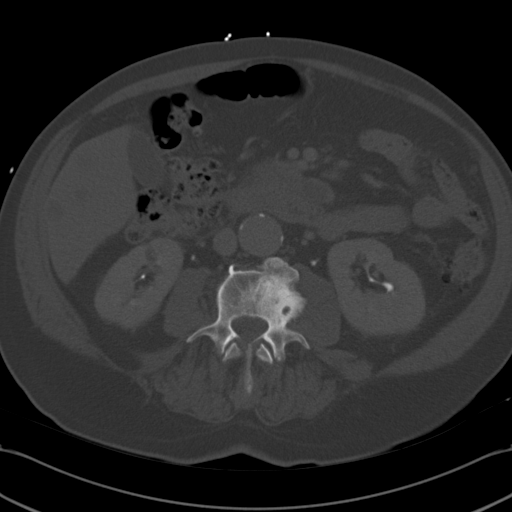
[im 16/40  soft-tissue]
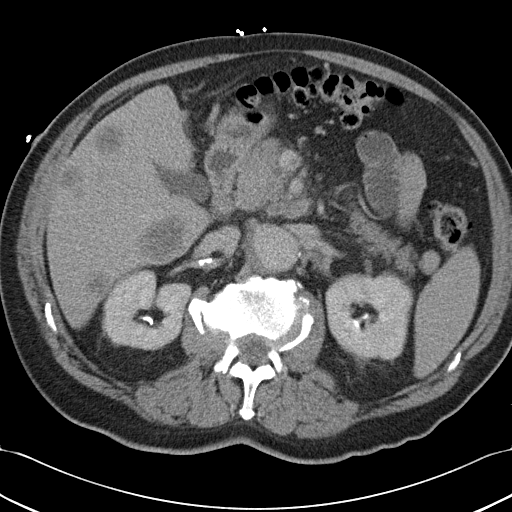
[im 16/40  lung]
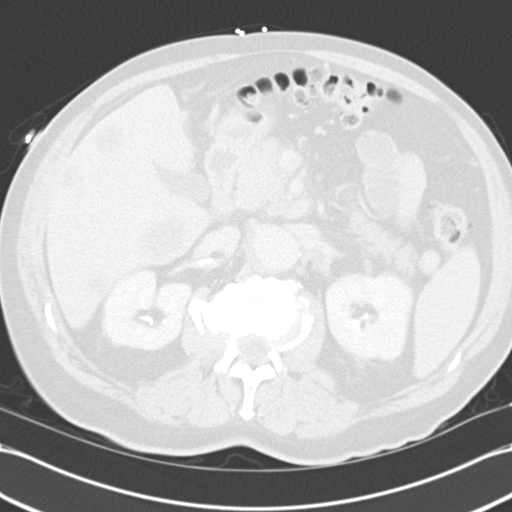
[im 24/40  soft-tissue]
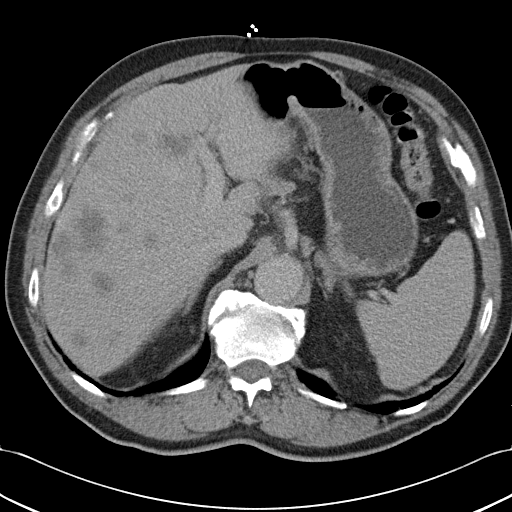
[im 24/40  lung]
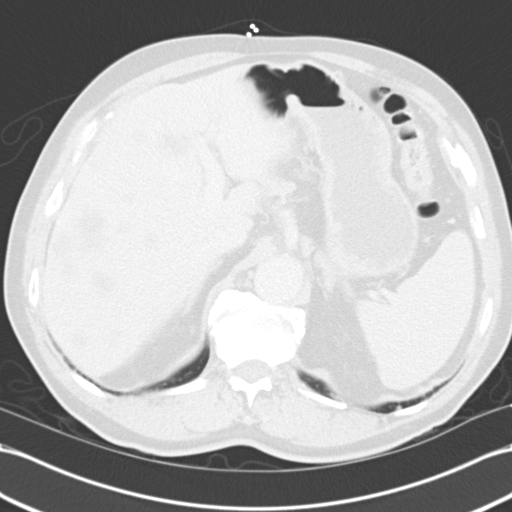
[im 32/40  soft-tissue]
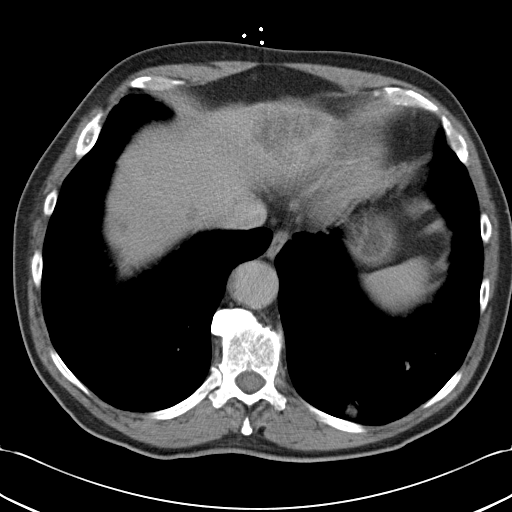
[im 32/40  lung]
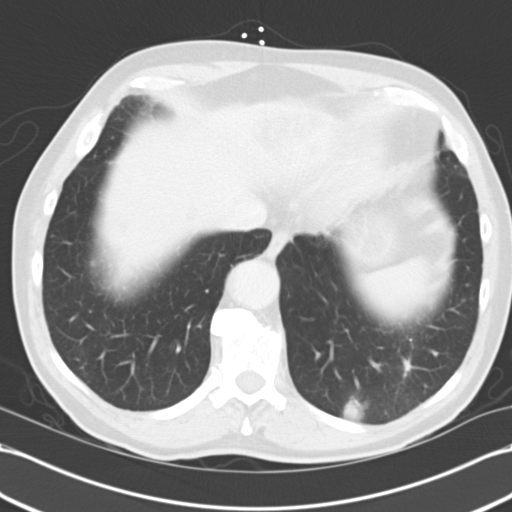

[Series 602: arterial phase coronals · coronal · arterial · 0.69mm/px · 3 of 105 slices shown, 4 images]
[im 27/105  soft-tissue]
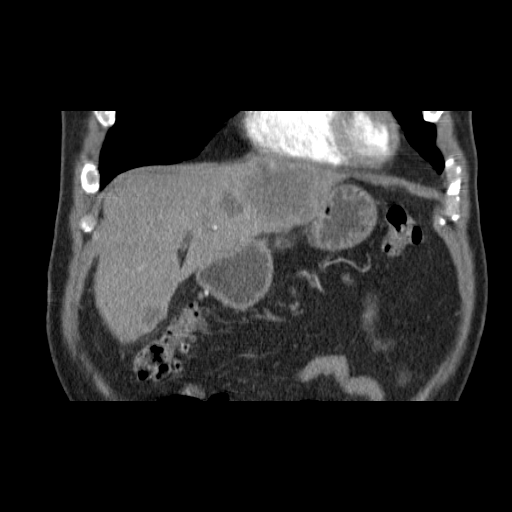
[im 53/105  soft-tissue]
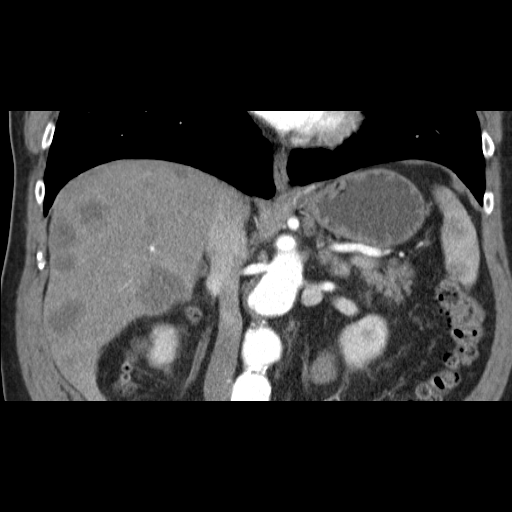
[im 53/105  bone]
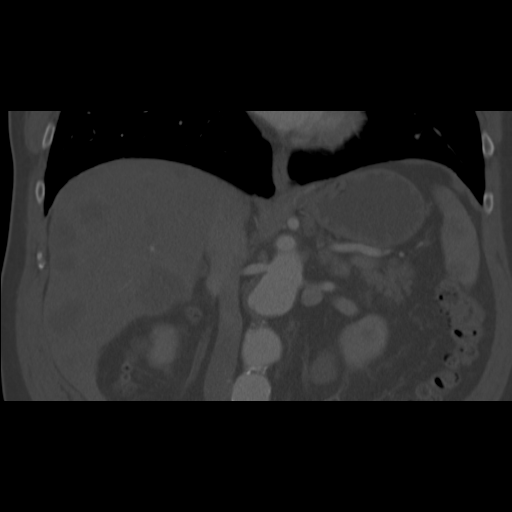
[im 79/105  soft-tissue]
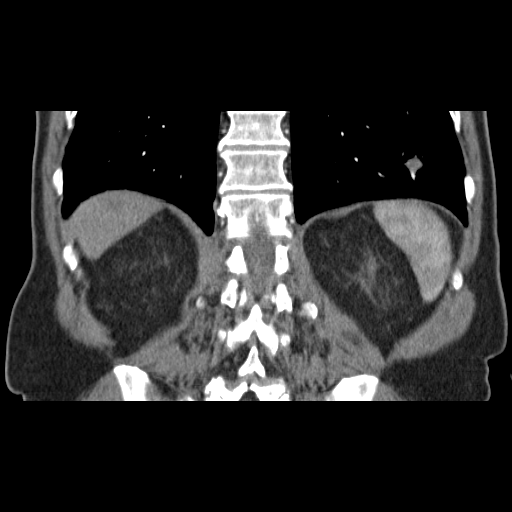

[12 of 46 positions shown; findings below may reference images not displayed]

Arterial findings:
Aorta:                  Ectatic and atheromatous, measuring 3.9 cm
diameter at the level of the SMA, 4.2 cm in its immediate
infrarenal segment, ectatic distally.  The bifurcation was not
included.  No dissection or stenosis.

Celiac axis:            Patent with unremarkable distal branching,
mild plaque in the splenic artery.

Superior mesenteric:Widely patent without   encasement by the
pancreatic lesion.

Left renal:             Single, widely patent.

Right renal:            Single, with scattered calcified plaque
without high-grade stenosis.

Inferior mesenteric:Patent in its proximal visualized segment.

Venous findings:  Patent portal hepatic veins, portal vein, splenic
vein, bilateral renal veins, and IVC.  There is nonocclusive
thrombus in the superior mesenteric vein just inferior to its
confluence with the splenic and portal veins, without definite
encasement by the pancreatic mass..

 Review of the MIP images confirms the above findings.

Nonvascular findings: Multiple liver lesions showing peripheral
enhancement and central low attenuation, largest in the lateral
left hepatic segment measuring at least 6.1 cm transverse diameter.
There is no intrahepatic biliary ductal dilatation.  There is a 6
cm mass in the pancreatic uncinate process with some adjacent
central mesenteric adenopathy as well as periportal and portacaval
adenopathy.  There is no dilatation of the main pancreatic duct.

Unremarkable adrenal glands, kidneys, spleen.  Stomach, and
visualized portions of small bowel and colon are nondilated.
Scattered descending colon diverticula noted.  No ascites.  No free
air. Spondylitic changes in the visualized thoracolumbar spine.
IMPRESSION: 1.  6 cm pancreatic mass with regional adenopathy and  multiple
liver lesions suggesting metastatic pancreatic carcinoma. Liver
lesions approachable for percutaneous biopsy if clinically
relevant.
2.  Nonocclusive thrombus in the superior mesenteric vein without
definite encasement by the pancreatic mass.
3. 4.2 cm aneurysmal abdominal aorta.Recommend followup by US in 1
year.  This recommendation follows ACR consensus guidelines: White
Paper of the ACR Incidental Findings Committee II on Vascular
Findings.  [HOSPITAL] [id number]; [DATE].
